# Patient Record
Sex: Female | Born: 1960
Health system: Southern US, Community
[De-identification: ages and names within clinical notes are randomized; demographics above are authoritative.]

## PROBLEM LIST (undated history)

## (undated) DIAGNOSIS — Z8619 Personal history of other infectious and parasitic diseases: Secondary | ICD-10-CM

## (undated) DIAGNOSIS — N83201 Unspecified ovarian cyst, right side: Secondary | ICD-10-CM

## (undated) DIAGNOSIS — R079 Chest pain, unspecified: Secondary | ICD-10-CM

## (undated) DIAGNOSIS — E781 Pure hyperglyceridemia: Secondary | ICD-10-CM

## (undated) DIAGNOSIS — E559 Vitamin D deficiency, unspecified: Secondary | ICD-10-CM

## (undated) DIAGNOSIS — K760 Fatty (change of) liver, not elsewhere classified: Secondary | ICD-10-CM

## (undated) DIAGNOSIS — M549 Dorsalgia, unspecified: Secondary | ICD-10-CM

## (undated) DIAGNOSIS — G932 Benign intracranial hypertension: Secondary | ICD-10-CM

## (undated) DIAGNOSIS — M2550 Pain in unspecified joint: Secondary | ICD-10-CM

## (undated) DIAGNOSIS — R0602 Shortness of breath: Secondary | ICD-10-CM

## (undated) DIAGNOSIS — M1990 Unspecified osteoarthritis, unspecified site: Secondary | ICD-10-CM

## (undated) DIAGNOSIS — E669 Obesity, unspecified: Secondary | ICD-10-CM

## (undated) DIAGNOSIS — F419 Anxiety disorder, unspecified: Secondary | ICD-10-CM

## (undated) DIAGNOSIS — Z6841 Body Mass Index (BMI) 40.0 and over, adult: Secondary | ICD-10-CM

## (undated) DIAGNOSIS — R519 Headache, unspecified: Secondary | ICD-10-CM

## (undated) DIAGNOSIS — K5900 Constipation, unspecified: Secondary | ICD-10-CM

## (undated) DIAGNOSIS — N83202 Unspecified ovarian cyst, left side: Secondary | ICD-10-CM

## (undated) DIAGNOSIS — R002 Palpitations: Secondary | ICD-10-CM

## (undated) DIAGNOSIS — Z8249 Family history of ischemic heart disease and other diseases of the circulatory system: Secondary | ICD-10-CM

## (undated) DIAGNOSIS — G8929 Other chronic pain: Secondary | ICD-10-CM

## (undated) DIAGNOSIS — R7303 Prediabetes: Secondary | ICD-10-CM

## (undated) DIAGNOSIS — Z9889 Other specified postprocedural states: Secondary | ICD-10-CM

## (undated) DIAGNOSIS — R600 Localized edema: Secondary | ICD-10-CM

## (undated) DIAGNOSIS — N979 Female infertility, unspecified: Secondary | ICD-10-CM

## (undated) DIAGNOSIS — J341 Cyst and mucocele of nose and nasal sinus: Secondary | ICD-10-CM

## (undated) HISTORY — DX: Unspecified osteoarthritis, unspecified site: M19.90

## (undated) HISTORY — DX: Prediabetes: R73.03

## (undated) HISTORY — DX: Vitamin D deficiency, unspecified: E55.9

## (undated) HISTORY — DX: Pain in unspecified joint: M25.50

## (undated) HISTORY — DX: Dorsalgia, unspecified: M54.9

## (undated) HISTORY — DX: Personal history of other infectious and parasitic diseases: Z86.19

## (undated) HISTORY — DX: Palpitations: R00.2

## (undated) HISTORY — PX: OVARIAN CYST SURGERY: SHX726

## (undated) HISTORY — DX: Cyst and mucocele of nose and nasal sinus: J34.1

## (undated) HISTORY — DX: Anxiety disorder, unspecified: F41.9

## (undated) HISTORY — DX: Unspecified ovarian cyst, right side: N83.201

## (undated) HISTORY — DX: Unspecified ovarian cyst, left side: N83.202

## (undated) HISTORY — DX: Other chronic pain: G89.29

## (undated) HISTORY — DX: Shortness of breath: R06.02

## (undated) HISTORY — DX: Pure hyperglyceridemia: E78.1

## (undated) HISTORY — DX: Female infertility, unspecified: N97.9

## (undated) HISTORY — DX: Family history of ischemic heart disease and other diseases of the circulatory system: Z82.49

## (undated) HISTORY — DX: Chest pain, unspecified: R07.9

## (undated) HISTORY — DX: Constipation, unspecified: K59.00

## (undated) HISTORY — DX: Headache, unspecified: R51.9

## (undated) HISTORY — PX: NASAL SINUS SURGERY: SHX719

## (undated) HISTORY — DX: Fatty (change of) liver, not elsewhere classified: K76.0

## (undated) HISTORY — PX: MASTOIDECTOMY: SHX711

## (undated) HISTORY — DX: Localized edema: R60.0

## (undated) HISTORY — DX: Body Mass Index (BMI) 40.0 and over, adult: Z684

## (undated) HISTORY — DX: Other specified postprocedural states: Z98.890

## (undated) HISTORY — DX: Morbid (severe) obesity due to excess calories: E66.01

## (undated) HISTORY — PX: TONSILLECTOMY: SUR1361

## (undated) HISTORY — DX: Obesity, unspecified: E66.9

---
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
## 1998-09-30 ENCOUNTER — Other Ambulatory Visit: Admission: RE | Admit: 1998-09-30 | Discharge: 1998-09-30 | Payer: Self-pay | Admitting: Obstetrics and Gynecology

## 1999-04-21 ENCOUNTER — Ambulatory Visit (HOSPITAL_COMMUNITY): Admission: RE | Admit: 1999-04-21 | Discharge: 1999-04-21 | Payer: Self-pay | Admitting: Otolaryngology

## 1999-04-21 ENCOUNTER — Encounter: Payer: Self-pay | Admitting: Otolaryngology

## 1999-10-17 ENCOUNTER — Other Ambulatory Visit: Admission: RE | Admit: 1999-10-17 | Discharge: 1999-10-17 | Payer: Self-pay | Admitting: Obstetrics and Gynecology

## 2001-03-25 ENCOUNTER — Other Ambulatory Visit: Admission: RE | Admit: 2001-03-25 | Discharge: 2001-03-25 | Payer: Self-pay | Admitting: Obstetrics and Gynecology

## 2002-03-26 ENCOUNTER — Other Ambulatory Visit: Admission: RE | Admit: 2002-03-26 | Discharge: 2002-03-26 | Payer: Self-pay | Admitting: Obstetrics and Gynecology

## 2002-12-16 ENCOUNTER — Ambulatory Visit (HOSPITAL_COMMUNITY): Admission: RE | Admit: 2002-12-16 | Discharge: 2002-12-16 | Payer: Self-pay | Admitting: Otolaryngology

## 2002-12-16 ENCOUNTER — Encounter (INDEPENDENT_AMBULATORY_CARE_PROVIDER_SITE_OTHER): Payer: Self-pay

## 2003-11-25 ENCOUNTER — Ambulatory Visit (HOSPITAL_COMMUNITY): Admission: RE | Admit: 2003-11-25 | Discharge: 2003-11-25 | Payer: Self-pay | Admitting: Obstetrics and Gynecology

## 2003-11-25 ENCOUNTER — Encounter (INDEPENDENT_AMBULATORY_CARE_PROVIDER_SITE_OTHER): Payer: Self-pay | Admitting: Specialist

## 2004-12-06 ENCOUNTER — Emergency Department (HOSPITAL_COMMUNITY): Admission: EM | Admit: 2004-12-06 | Discharge: 2004-12-06 | Payer: Self-pay | Admitting: Emergency Medicine

## 2005-10-17 ENCOUNTER — Other Ambulatory Visit: Admission: RE | Admit: 2005-10-17 | Discharge: 2005-10-17 | Payer: Self-pay | Admitting: Obstetrics and Gynecology

## 2007-05-19 ENCOUNTER — Ambulatory Visit: Payer: Self-pay | Admitting: Internal Medicine

## 2007-05-19 ENCOUNTER — Ambulatory Visit: Payer: Self-pay | Admitting: *Deleted

## 2007-06-02 ENCOUNTER — Ambulatory Visit: Payer: Self-pay | Admitting: Internal Medicine

## 2007-12-17 ENCOUNTER — Ambulatory Visit: Payer: Self-pay | Admitting: Family Medicine

## 2008-01-16 ENCOUNTER — Encounter (INDEPENDENT_AMBULATORY_CARE_PROVIDER_SITE_OTHER): Payer: Self-pay | Admitting: Nurse Practitioner

## 2008-01-16 ENCOUNTER — Ambulatory Visit: Payer: Self-pay | Admitting: Family Medicine

## 2008-01-16 LAB — CONVERTED CEMR LAB
ALT: 20 units/L (ref 0–35)
AST: 15 units/L (ref 0–37)
Albumin: 4.4 g/dL (ref 3.5–5.2)
Alkaline Phosphatase: 96 units/L (ref 39–117)
BUN: 13 mg/dL (ref 6–23)
Basophils Absolute: 0 10*3/uL (ref 0.0–0.1)
Basophils Relative: 1 % (ref 0–1)
CO2: 22 meq/L (ref 19–32)
Calcium: 9 mg/dL (ref 8.4–10.5)
Chloride: 104 meq/L (ref 96–112)
Creatinine, Ser: 0.74 mg/dL (ref 0.40–1.20)
Eosinophils Absolute: 0.2 10*3/uL (ref 0.0–0.7)
Eosinophils Relative: 2 % (ref 0–5)
Glucose, Bld: 95 mg/dL (ref 70–99)
HCT: 40.9 % (ref 36.0–46.0)
Hemoglobin: 13.3 g/dL (ref 12.0–15.0)
Lymphocytes Relative: 34 % (ref 12–46)
Lymphs Abs: 3 10*3/uL (ref 0.7–4.0)
MCHC: 32.5 g/dL (ref 30.0–36.0)
MCV: 87.6 fL (ref 78.0–100.0)
Monocytes Absolute: 0.4 10*3/uL (ref 0.1–1.0)
Monocytes Relative: 5 % (ref 3–12)
Neutro Abs: 5.2 10*3/uL (ref 1.7–7.7)
Neutrophils Relative %: 58 % (ref 43–77)
Platelets: 359 10*3/uL (ref 150–400)
Potassium: 4.2 meq/L (ref 3.5–5.3)
RBC: 4.67 M/uL (ref 3.87–5.11)
RDW: 13.2 % (ref 11.5–15.5)
Sodium: 140 meq/L (ref 135–145)
TSH: 1.56 microintl units/mL (ref 0.350–5.50)
Total Bilirubin: 0.6 mg/dL (ref 0.3–1.2)
Total Protein: 6.8 g/dL (ref 6.0–8.3)
WBC: 8.9 10*3/uL (ref 4.0–10.5)

## 2008-01-23 ENCOUNTER — Ambulatory Visit (HOSPITAL_COMMUNITY): Admission: RE | Admit: 2008-01-23 | Discharge: 2008-01-23 | Payer: Self-pay | Admitting: Family Medicine

## 2008-02-27 ENCOUNTER — Ambulatory Visit: Payer: Self-pay | Admitting: Family Medicine

## 2008-05-31 ENCOUNTER — Ambulatory Visit: Payer: Self-pay | Admitting: Internal Medicine

## 2008-06-03 ENCOUNTER — Ambulatory Visit (HOSPITAL_COMMUNITY): Admission: RE | Admit: 2008-06-03 | Discharge: 2008-06-03 | Payer: Self-pay | Admitting: Internal Medicine

## 2010-05-10 ENCOUNTER — Ambulatory Visit: Payer: Self-pay | Admitting: Internal Medicine

## 2010-05-18 ENCOUNTER — Ambulatory Visit (HOSPITAL_COMMUNITY): Admission: RE | Admit: 2010-05-18 | Discharge: 2010-05-18 | Payer: Self-pay | Admitting: Internal Medicine

## 2010-06-05 ENCOUNTER — Ambulatory Visit: Payer: Self-pay | Admitting: Internal Medicine

## 2010-06-05 ENCOUNTER — Encounter (INDEPENDENT_AMBULATORY_CARE_PROVIDER_SITE_OTHER): Payer: Self-pay | Admitting: Adult Health

## 2010-06-05 LAB — CONVERTED CEMR LAB
ALT: 17 units/L (ref 0–35)
AST: 11 units/L (ref 0–37)
Albumin: 4.4 g/dL (ref 3.5–5.2)
Alkaline Phosphatase: 92 units/L (ref 39–117)
BUN: 13 mg/dL (ref 6–23)
Basophils Absolute: 0 10*3/uL (ref 0.0–0.1)
Basophils Relative: 1 % (ref 0–1)
CO2: 24 meq/L (ref 19–32)
Calcium: 9.1 mg/dL (ref 8.4–10.5)
Chlamydia, DNA Probe: NEGATIVE
Chloride: 105 meq/L (ref 96–112)
Cholesterol: 173 mg/dL (ref 0–200)
Creatinine, Ser: 0.83 mg/dL (ref 0.40–1.20)
Eosinophils Absolute: 0.2 10*3/uL (ref 0.0–0.7)
Eosinophils Relative: 4 % (ref 0–5)
GC Probe Amp, Genital: NEGATIVE
Glucose, Bld: 108 mg/dL — ABNORMAL HIGH (ref 70–99)
HCT: 43.7 % (ref 36.0–46.0)
HDL: 58 mg/dL (ref >39)
Hemoglobin: 13.8 g/dL (ref 12.0–15.0)
LDL Cholesterol: 85 mg/dL (ref 0–99)
Lymphocytes Relative: 37 % (ref 12–46)
Lymphs Abs: 2.4 10*3/uL (ref 0.7–4.0)
MCHC: 31.6 g/dL (ref 30.0–36.0)
MCV: 90.7 fL (ref 78.0–100.0)
Monocytes Absolute: 0.3 10*3/uL (ref 0.1–1.0)
Monocytes Relative: 5 % (ref 3–12)
Neutro Abs: 3.6 10*3/uL (ref 1.7–7.7)
Neutrophils Relative %: 54 % (ref 43–77)
Pap Smear: NEGATIVE
Platelets: 359 10*3/uL (ref 150–400)
Potassium: 4.5 meq/L (ref 3.5–5.3)
RBC: 4.82 M/uL (ref 3.87–5.11)
RDW: 13.5 % (ref 11.5–15.5)
Sodium: 137 meq/L (ref 135–145)
TSH: 1.963 microintl units/mL (ref 0.350–4.500)
Total Bilirubin: 0.4 mg/dL (ref 0.3–1.2)
Total CHOL/HDL Ratio: 3
Total Protein: 6.8 g/dL (ref 6.0–8.3)
Triglycerides: 150 mg/dL — ABNORMAL HIGH (ref <150)
VLDL: 30 mg/dL (ref 0–40)
Vit D, 25-Hydroxy: 35 ng/mL (ref 30–89)
WBC: 6.6 10*3/uL (ref 4.0–10.5)

## 2010-06-27 ENCOUNTER — Ambulatory Visit: Payer: Self-pay | Admitting: Internal Medicine

## 2011-03-30 NOTE — Op Note (Signed)
NAME:  Morgan Maddox, Morgan Maddox                          ACCOUNT NO.:  [id number]   MEDICAL RECORD NO.:  [id number]                   PATIENT TYPE:  OIB   LOCATION:  2870                                 FACILITY:  [facility]   PHYSICIAN:  Hermelinda Medicus, M.D.                DATE OF BIRTH:  02-Apr-1961   DATE OF PROCEDURE:  12/16/2002  DATE OF DISCHARGE:                                 OPERATIVE REPORT   PREOPERATIVE DIAGNOSIS:  Bilateral maxillary sinusitis with history of  maxillary sinus surgery, with right maxillary sinus mucocele, with right  ethmoid sinusitis.   POSTOPERATIVE DIAGNOSIS:  Bilateral maxillary sinusitis with history of  maxillary sinus surgery, with right maxillary sinus mucocele, with right  ethmoid sinusitis.   PROCEDURE:  Functional endoscopic sinus surgery with bilateral maxillary  sinus ostial enlargement, with removal of occlusive very large mucocele and  polyps, and a right ethmoidectomy.   SURGEON:  Hermelinda Medicus, M.D.   ANESTHESIA:  Local 1% Xylocaine with epinephrine, topical cocaine 200 mg,  with MAC standby with Sheldon Silvan, M.D.   DESCRIPTION OF PROCEDURE:  The patient was placed in the supine position  under local anesthesia, was prepped and draped in a sterile manner using  Betadine x3 and the usual head drape.  The nose was anesthetized with the  above-mentioned local anesthetic and then the right side, which was by far  the worst side, was approached first.  We found polyps of the ethmoid and  maxillary sinus ostia and then we immediately entered the ethmoid sinus and  primarily was an anterior problem, and we were able to remove the thickened  membrane and polyps from the ethmoid sinus on the right side using the 0  degree scope and the straight Blakesley-Wildes.  The maxillary sinus was  totally obstructed also with polypoid debris and then this very large  mucocele, which we were able to tease out essentially intact, which was a  very large mucous cyst  containing clear fluid we did not culture.  She had  been on Levaquin.  Once this was removed, we did an antrostomy and further  suctioned her sinus and then looked into her sinus, and the mucous membrane  itself, which was left behind, was in reasonably decent condition though it  was somewhat thickened, will revert to its more normal status.  Once this  was achieved, we then worked toward the left side, and we were able to see  into the left maxillary sinus.  A few polyps were seen.  We opened the  natural ostium a little bit more, though we had a small ostium there, and  removed some of the thickened membrane so it would drain more effectively.  Once this was achieved, we then used the 0 degree scope, the 70 degree  scope, the straight and  upbiting Blakesley-Wildes.  Once this was achieved, we placed Gelfoam within  the ethmoid  sinus on the right side and put Telfa on the right side.  The  patient tolerated the procedure very well.  Her follow-up will be today at  3:30 for packing removal and then one week, three weeks, six weeks, six  months, and a year.                                               Hermelinda Medicus, M.D.    JC/MEDQ  D:  12/16/2002  T:  12/16/2002  Job:  [id number]   cc:   Wilson Singer, M.D.  [address]  [place]  [place] [address]  Fax: [phone]

## 2011-03-30 NOTE — H&P (Signed)
NAME:  Morgan Maddox, Morgan Maddox                          ACCOUNT NO.:  [id number]   MEDICAL RECORD NO.:  [id number]                   PATIENT TYPE:  OIB   LOCATION:  2870                                 FACILITY:  [facility]   PHYSICIAN:  Hermelinda Medicus, M.D.                DATE OF BIRTH:  1961/03/05   DATE OF ADMISSION:  12/16/2002  DATE OF DISCHARGE:                                HISTORY & PHYSICAL   HISTORY OF PRESENT ILLNESS:  This patient is a 50 year old female who has  had a long history of sinusitis, going back to the early 1980s with me.  She  has had a left Caldwell-Luc and nasal antrostomy in August 1984.  She has  had some endoscopic sinus surgery, right maxillary sinus osteoplasty in  1989, and she has done reasonably well, until just recently, when we needed  to give her some prednisone, a prednisone Dosepak, in 2000.  She did better,  then in 2004, she has progressed with her medical physician but had come in  to see me, having to use Lorabid in the past and not resolving this sinus  infection.  She then was placed on Levaquin 500 mg daily and Prolex DH and  still with poor resolution, so we obtained a CAT scan which showed primarily  a very large -- totally obliterating the sinus -- right maxillary sinus  mucocele with some evidence of left maxillary sinusitis and right ethmoid  sinusitis.  She continues her decongestants, has had two rounds of the  antibiotic using Levaquin and now enters for functional endoscopic sinus  surgery to remove the mucocele, to revise the maxillary ostia and to open  the ethmoid sinus on the right side once again.   PAST MEDICAL HISTORY:  Her past history is unremarkable.   MEDICATIONS:  She has no medications except the above-mentioned.   SOCIAL HISTORY:  She is a smoker of about one pack a day and does an  occasional alcohol.   PAST SURGICAL HISTORY:  She has had sinus surgery as we above mentioned,  ovarian cystectomy four times and a  tonsillectomy.   REVIEW OF SYSTEMS:  She does have some joint pain in her knees.  Remainder  of her review of systems is unremarkable.   ALLERGIES:  She has no allergies to medications.   PHYSICAL EXAMINATION:  VITAL SIGNS:  Her physical examination reveals a  blood pressure of 123/85, she is 5 feet 7 inches, she weighs 248, heart rate  is 77.  HEENT:  The ears are clear.  The oral cavity is clear.  The nose shows  turbinate hypertrophy of moderate proportion but we can see some polypoid  debris in the right nasal vestibule near the middle turbinate.  I feel some  of this is probably from the ethmoid, some from the maxillary on the right  side.  The left side is in better condition  but the CAT scan shows the left  side also shows some thickening of the mucous membrane and fluid along the  floor of the sinus.  Her larynx is clear.  True cords, false cords,  epiglottis, base of tongue are clear.  NECK:  Neck is free of any thyromegaly, cervical adenopathy or mass.  CHEST:  Chest clear.  No rales, rhonchi or wheezes.  She does have a bit of  a cough secondary to her smoking.  CARDIOVASCULAR:  Unremarkable.  EKG is normal sinus rhythm.  ABDOMEN:  Abdomen is obese.  EXTREMITIES:  Extremities are within normal limits.   INITIAL DIAGNOSES:  1. Bilateral maxillary sinusitis with right maxillary sinus totally     occlusive mucocele, right ethmoid sinusitis.  2. History of ovarian cyst.  3. History of tonsillectomy.  4. History of left Caldwell-Luc and a right maxillary ostial enlargement in     1984 and 1989.                                               Hermelinda Medicus, M.D.    JC/MEDQ  D:  12/16/2002  T:  12/16/2002  Job:  [id number]   cc:   Wilson Singer, M.D.  [address]  [place]  [place] [address]  Fax: [phone]

## 2011-03-30 NOTE — Op Note (Signed)
NAME:  Morgan Maddox, Morgan Maddox                          ACCOUNT NO.:  [id number]   MEDICAL RECORD NO.:  [id number]                   PATIENT TYPE:  AMB   LOCATION:  SDC                                  FACILITY:  [facility]   PHYSICIAN:  Miguel Aschoff, M.D.                    DATE OF BIRTH:  1960/11/21   DATE OF PROCEDURE:  11/25/2003  DATE OF DISCHARGE:                                 OPERATIVE REPORT   PREOPERATIVE DIAGNOSIS:  Postmenopausal bleeding.   POSTOPERATIVE DIAGNOSIS:  Postmenopausal bleeding.   PROCEDURE:  D&C hysteroscopy.   SURGEON:  Miguel Aschoff, M.D.   ANESTHESIA:  General.   COMPLICATIONS:  None.   JUSTIFICATION:  The patient is a 50 year old white female, confirmed to be  postmenopausal approximately 5 years ago with gonadotropin studies.  The  patient had no bleeding for 5 years and presented to the  office with heavy,  profuse vaginal bleeding on November 24, 2003.  She presents now to undergo  D&C and hysteroscopy to see if an etiology for the bleeding could be  established and corrected.  The risks and benefits of the procedure were  discussed with the patient.   DESCRIPTION OF PROCEDURE:  The patient was taken to the operating room,  placed in the supine position, and general anesthesia was administered  without difficulty.  She was then placed in the dorsal lithotomy position,  prepped and draped in the usual sterile fashion.  Bladder was catheterized.  Examination was carried out.  This revealed normal external genitalia,  normal Bartholin Skene's glands, normal urethra - the vulva was without  gross lesion.  The cervix was without gross lesions. The uterus was noted to  be globular, top-normal in size.  No adnexal masses were noted.  Speculum  was then placed in the vaginal vault.  Anterior cervical lip was grasped  with the tenaculum and then dilated using serial Pratt dilators.  At this  point, the diagnostic hysteroscope was advanced through the endocervix.  No  endocervical lesions were noted.  Inspection of the endometrial cavity did  not reveal any submucous myomas or polyps.  There did appear to be a large  amount of endometrial tissue present.  This was diffuse in nature and not  localized.  The hysteroscope was then removed, and a cervical curettage was  then carried out and sent as a  Separate specimen, and then vigorous endometrial curettage was carried out  with a serrated curette and the tissue removed sent for histologic study.  At this point, with no other abnormalities being noted, the instruments were  removed.  Hemostasis was readily achieved, and the patient was taken out of  lithotomy position and brought to the recovery room in satisfactory  condition.  The estimated blood loss was approximately 40 mL.  The patient  tolerated the procedure well and then went to the recovery room in  satisfactory condition.                                               Miguel Aschoff, M.D.   AR/MEDQ  D:  11/25/2003  T:  11/25/2003  Job:  [id number]

## 2013-04-14 ENCOUNTER — Emergency Department
Admission: EM | Admit: 2013-04-14 | Discharge: 2013-04-14 | Disposition: A | Discharge status: Home / Self Care | Payer: Self-pay | Source: Home / Self Care | Attending: Family Medicine | Admitting: Family Medicine

## 2013-04-14 ENCOUNTER — Emergency Department (INDEPENDENT_AMBULATORY_CARE_PROVIDER_SITE_OTHER): Payer: BC Managed Care – PPO

## 2013-04-14 ENCOUNTER — Encounter: Payer: Self-pay | Admitting: *Deleted

## 2013-04-14 DIAGNOSIS — R059 Cough, unspecified: Secondary | ICD-10-CM

## 2013-04-14 DIAGNOSIS — R0600 Dyspnea, unspecified: Secondary | ICD-10-CM

## 2013-04-14 DIAGNOSIS — R635 Abnormal weight gain: Secondary | ICD-10-CM

## 2013-04-14 DIAGNOSIS — J9819 Other pulmonary collapse: Secondary | ICD-10-CM

## 2013-04-14 DIAGNOSIS — R05 Cough: Secondary | ICD-10-CM

## 2013-04-14 DIAGNOSIS — R0609 Other forms of dyspnea: Secondary | ICD-10-CM

## 2013-04-14 DIAGNOSIS — R0989 Other specified symptoms and signs involving the circulatory and respiratory systems: Secondary | ICD-10-CM

## 2013-04-14 DIAGNOSIS — R5381 Other malaise: Secondary | ICD-10-CM

## 2013-04-14 DIAGNOSIS — R079 Chest pain, unspecified: Secondary | ICD-10-CM

## 2013-04-14 LAB — POCT CBC W AUTO DIFF (K'VILLE URGENT CARE)

## 2013-04-14 LAB — POCT URINALYSIS DIPSTICK
Blood, UA: NEGATIVE
Glucose, UA: NEGATIVE
Protein, UA: NEGATIVE
Spec Grav, UA: >=1.03 (ref 1.005–1.03)
Urobilinogen, UA: 0.2 (ref 0–1)

## 2013-04-14 NOTE — ED Notes (Signed)
Pt c/o cough, lower extremity swelling, fatigue, and chest congestion x 4wks. Denies fever. She took levaquin 500mg , allegra and saline NS with no relief.

## 2013-04-14 NOTE — ED Provider Notes (Addendum)
History     CSN: [id number]  Arrival date & time 04/14/13  1509   First MD Initiated Contact with Patient 04/14/13 1556      Chief Complaint  Patient presents with  . Fatigue  . Cough  . Leg Swelling       HPI Comments: Patient reports that she developed a URI about 4 weeks ago, including a very sore throat.  She developed sinus congestion and cough, and felt very fatigued.  On 04/01/13 she visited a [facility] where she was treated for Sinusitis and a UTI with Levaquin.  The patient stopped the Levaquin after 6 days because she felt worse on the medication.  Since then she has remained unusually fatigued, with persistent chest congestion especially at night.  She has an occasional non-productive cough.  She has developed shortness of breath with activity and wheezes at times.  Her right ear feels clogged.  She has occasional chills but no fever.  She notes mild urinary frequency but no dysuria at present. Her appetite is decreased, but she has gained approximately 32 pounds in the past 3 months.  She notes an increase in lower leg swellng each evening, resolved by morning. Family history of CV disease:  Her mother had diabetes, and had several MI's beginning age 67. Social history:  Quit smoking 9 months ago (1/2 to 1 pack per day)   The history is provided by the patient.    History reviewed. No pertinent past medical history.  Past Surgical History  Procedure Laterality Date  . Nasal sinus surgery    . Tonsillectomy    . Ovarian cyst surgery      Family History  Problem Relation Age of Onset  . Heart failure Mother     History  Substance Use Topics  . Smoking status: Former Games developer  . Smokeless tobacco: Not on file  . Alcohol Use: Yes    OB History   Grav Para Term Preterm Abortions TAB SAB Ect Mult Living                  Review of Systems  Constitutional: Positive for chills, activity change, appetite change, fatigue and unexpected weight change. Negative for  fever and diaphoresis.  HENT: Positive for ear pain, congestion, sore throat, rhinorrhea, sneezing and neck stiffness. Negative for nosebleeds, facial swelling, trouble swallowing, neck pain, sinus pressure and ear discharge.   Eyes: Negative.   Respiratory: Positive for cough, shortness of breath and wheezing. Negative for chest tightness.   Cardiovascular: Negative.   Gastrointestinal: Negative.   Genitourinary: Positive for frequency. Negative for dysuria, urgency, hematuria, flank pain, decreased urine volume, difficulty urinating and pelvic pain.  Skin: Negative.   Neurological: Positive for headaches.    Allergies  Benadryl; Caladryl; and Calamine  Home Medications   Current Outpatient Rx  Name  Route  Sig  Dispense  Refill  . aspirin 81 MG tablet   Oral   Take 81 mg by mouth daily.           BP 137/83  Pulse 87  Temp(Src) 98.2 F (36.8 C) (Oral)  Resp 18  Wt 262 lb (118.842 kg)  SpO2 98%  Physical Exam Nursing notes and Vital Signs reviewed. Appearance:  Patient appears stated age, and in no acute distress.  Patient is obese Eyes:  Pupils are equal, round, and reactive to light and accomodation.  Extraocular movement is intact.  Conjunctivae are not inflamed  Ears:  Canals normal.  Tympanic membranes  normal.  Nose:  Mildly congested turbinates.  No sinus tenderness.   Mouth:  No lesions Pharynx:  Normal Neck:  Supple.   Tender shotty posterior nodes are palpated bilaterally  Lungs:  Clear to auscultation.  Breath sounds are equal.  Heart:  Regular rate and rhythm without murmurs, rubs, or gallops.  Abdomen:   Mild tenderness over spleen without masses or hepatosplenomegaly.  Bowel sounds are present.  No CVA or flank tenderness.  Extremities:  Trace lower leg edema.  No calf tenderness Skin:  No rash present.   ED Course  Procedures  none  Labs Reviewed  COMPLETE METABOLIC PANEL WITH GFR pending  TSH pending  EBV AB TO VIRAL CAPSID AG PNL, IGG+IGM pending   POCT URINALYSIS DIPSTICK:  KET Trace; SG >= 1.030, otherwise negative  POCT CBC W AUTO DIFF ([facility]):  WBC 7.7; LY 40.5; MO 16.2; GR 43.3; Hgb 12.8; Platelets 312  EKG:  Occasional PVC; otherwise no acute changes   Dg Chest 2 View  04/14/2013   *RADIOLOGY REPORT*  Clinical Data: Cough leg swelling, chest pain  CHEST - 2 VIEW  Comparison: None.  Findings: Cardiomediastinal silhouette is unremarkable.  Elevation of the left hemidiaphragm is noted.  No acute infiltrate or pulmonary edema.  Mild basilar atelectasis.  IMPRESSION: Elevation of the left hemidiaphragm.  No active disease.  Mild basilar atelectasis.   Original Report Authenticated By: Natasha Mead, M.D.     1. Other malaise and fatigue; note normal white blood count with monocytosis (Monocytes 16.2%);  ?Convalescent mono   2. Dyspnea   3. Weight gain, abnormal       MDM  Check EBV titers, CMP, and TSH Followup with PCP one week.  Patient has multiple risk factors for CV disease and DM        Lattie Haw, MD 04/15/13 1154  Addendum: EBV VCA IgM and IgG positive.  Diagnosis: Convalescent Mononucleosis CMP and TSH normal. Discussed results with patient.  She has a followup appt with Dr. Ivan Anchors on 04/21/13 at 9AM.  Lattie Haw, MD 04/15/13 1218

## 2013-04-15 LAB — COMPLETE METABOLIC PANEL WITH GFR
Albumin: 3.9 g/dL (ref 3.5–5.2)
Alkaline Phosphatase: 74 U/L (ref 39–117)
BUN: 16 mg/dL (ref 6–23)
CO2: 28 mEq/L (ref 19–32)
GFR, Est African American: 70 mL/min
GFR, Est Non African American: 61 mL/min
Glucose, Bld: 93 mg/dL (ref 70–99)
Potassium: 4.4 mEq/L (ref 3.5–5.3)

## 2013-04-15 LAB — TSH: TSH: 2.229 u[IU]/mL (ref 0.350–4.500)

## 2013-04-15 LAB — EBV AB TO VIRAL CAPSID AG PNL, IGG+IGM
EBV VCA IgG: 338 U/mL — ABNORMAL HIGH (ref <18.0)
EBV VCA IgM: >160 U/mL — ABNORMAL HIGH (ref <36.0)

## 2013-04-21 ENCOUNTER — Encounter: Payer: Self-pay | Admitting: Family Medicine

## 2013-04-21 ENCOUNTER — Ambulatory Visit (INDEPENDENT_AMBULATORY_CARE_PROVIDER_SITE_OTHER): Payer: BC Managed Care – PPO | Admitting: Family Medicine

## 2013-04-21 VITALS — BP 125/84 | HR 75 | Ht 67.0 in | Wt 258.0 lb

## 2013-04-21 DIAGNOSIS — E781 Pure hyperglyceridemia: Secondary | ICD-10-CM

## 2013-04-21 DIAGNOSIS — Z9889 Other specified postprocedural states: Secondary | ICD-10-CM

## 2013-04-21 DIAGNOSIS — J341 Cyst and mucocele of nose and nasal sinus: Secondary | ICD-10-CM

## 2013-04-21 DIAGNOSIS — R609 Edema, unspecified: Secondary | ICD-10-CM

## 2013-04-21 DIAGNOSIS — Z8619 Personal history of other infectious and parasitic diseases: Secondary | ICD-10-CM

## 2013-04-21 DIAGNOSIS — R739 Hyperglycemia, unspecified: Secondary | ICD-10-CM

## 2013-04-21 DIAGNOSIS — E782 Mixed hyperlipidemia: Secondary | ICD-10-CM | POA: Insufficient documentation

## 2013-04-21 DIAGNOSIS — R7309 Other abnormal glucose: Secondary | ICD-10-CM

## 2013-04-21 DIAGNOSIS — R600 Localized edema: Secondary | ICD-10-CM

## 2013-04-21 HISTORY — DX: Pure hyperglyceridemia: E78.1

## 2013-04-21 HISTORY — DX: Personal history of other infectious and parasitic diseases: Z86.19

## 2013-04-21 HISTORY — DX: Cyst and mucocele of nose and nasal sinus: J34.1

## 2013-04-21 HISTORY — DX: Other specified postprocedural states: Z98.890

## 2013-04-21 LAB — CBC
HCT: 40.3 % (ref 36.0–46.0)
Hemoglobin: 14.5 g/dL (ref 12.0–15.0)
MCH: 30.1 pg (ref 26.0–34.0)
MCHC: 36 g/dL (ref 30.0–36.0)
RBC: 4.82 MIL/uL (ref 3.87–5.11)

## 2013-04-21 LAB — LIPID PANEL
Cholesterol: 181 mg/dL (ref 0–200)
HDL: 61 mg/dL (ref >39)
LDL Cholesterol: 97 mg/dL (ref 0–99)
Total CHOL/HDL Ratio: 3 Ratio
Triglycerides: 114 mg/dL (ref <150)
VLDL: 23 mg/dL (ref 0–40)

## 2013-04-21 NOTE — Progress Notes (Signed)
CC: Morgan Maddox is a 52 y.o. female is here for Establish Care and Toxic Inhalation   Subjective: HPI:  Patient presents to establish care  Followup mononucleosis, elevated IgM and IgG last week in urgent care. Since that visit she has reported somewhat improved fatigue, significantly improved pharyngitis, slightly improved posterior neck tenderness, and mild improvement of left upper quadrant pain. Overall she states she feels she is improving without any intervention other than relative rest.  She currently denies fevers, chills, unintentional weight loss, abdominal pain, rashes.  She does not engage in competitive sports, contact sports, nor situations where she may take a blow to the left upper quadrant.  She's many questions about how she possibly could have gotten mono, no known sick contacts  She complains of lower extremity edema bilateral and symmetric for the past month. It is worse after long periods of sitting, improved with elevation of legs, best first thing in the morning. She had this 1990 and was taking Lasix as needed. She denies personal history of blood clots, asymmetric swelling, nor weeping of the legs. She denies personal history of CHF, kidney failure, nor protein abnormality.  She reports history of hypertriglyceridemia. Currently trying to watch what she eats admits room for improvement. No formal exercise program.  She reports strong family history of type 2 diabetes and early heart disease, she believes she has had elevated glucose sometime in the past. Denies polyuria polyphasia polydipsia  Review of Systems - General ROS: negative for - chills, fever, night sweats, weight gain or weight loss Ophthalmic ROS: negative for - decreased vision Psychological ROS: negative for - anxiety or depression ENT ROS: negative for - hearing change, nasal congestion, tinnitus or allergies Hematological and Lymphatic ROS: negative for - bleeding problems, bruising or swollen  lymph nodes Breast ROS: negative Respiratory ROS: no cough, shortness of breath, or wheezing Cardiovascular ROS: no chest pain or dyspnea on exertion Gastrointestinal ROS: no abdominal pain, change in bowel habits, or black or bloody stools Genito-Urinary ROS: negative for - genital discharge, genital ulcers, incontinence or abnormal bleeding from genitals Musculoskeletal ROS: negative for - joint pain or muscle pain Neurological ROS: negative for - headaches or memory loss Dermatological ROS: negative for lumps, mole changes, rash and skin lesion changes Past Medical History  Diagnosis Date  . Hypertriglyceridemia 04/21/2013  . 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 History of sinus surgery 04/21/2013  . History of mononucleosis 04/21/2013     Family History  Problem Relation Age of Onset  . Heart failure Mother   .        History  Substance Use Topics  . Smoking status: Former Games developer  . Smokeless tobacco: Not on file  . Alcohol Use: Yes     Objective: Filed Vitals:   04/21/13 0920  BP: 125/84  Pulse: 75    General: Alert and Oriented, No Acute Distress HEENT: Pupils equal, round, reactive to light. Conjunctivae clear.  External ears unremarkable, canals clear with intact TMs with appropriate landmarks.  Middle ear appears open without effusion. Pink inferior turbinates.  Moist mucous membranes, pharynx without inflammation nor lesions.  Mild postauricular lymphadenopathy on the right otherwise no palpable masses in the neck. Lungs: Clear to auscultation bilaterally, no wheezing/ronchi/rales.  Comfortable work of breathing. Good air movement. Cardiac: Regular rate and rhythm. Normal S1/S2.  No murmurs, rubs, nor gallops.   Abdomen: Obese soft nontender with no left upper quadrant pain nor palpable spleen. Extremities: No peripheral edema.  Strong  peripheral pulses.  Mental Status: No depression, anxiety, nor agitation. Skin: Warm and dry.  Assessment & Plan: Morgan Maddox was seen  today for establish care and toxic inhalation.  Diagnoses and associated orders for this visit:  History of mononucleosis - CBC  Hypertriglyceridemia - Lipid panel  Hyperglycemia - Hemoglobin A1c     Mononucleosis: Clinically improving, will obtain white count and differential Hypertriglyceridemia: Due for cholesterol panel she is fasting Hypoglycemia: Check an A1c, random glucose last week normal I encouraged her to have her annual mammogram, she would prefer to wait until feeling better, she would prefer to wait for colonoscopy until a later date due to scheduling issues  30 minutes spent face-to-face during visit today of which at least 50% was counseling or coordinating care regarding breast cancer screening, coloRectal cancer screening, hypertriglyceridemia, hyperglycemia, mononucleosis.   Return in about 4 weeks (around 05/19/2013) for CPE.

## 2013-04-22 ENCOUNTER — Telehealth: Payer: Self-pay | Admitting: Family Medicine

## 2013-04-22 DIAGNOSIS — R7303 Prediabetes: Secondary | ICD-10-CM

## 2013-04-22 NOTE — Telephone Encounter (Signed)
[name], Will you please let Morgan Maddox know that her cholesterol and triglycerides look fantastic, additionally her white blood cell count is within normal range reflecting her immune system resolving from mono.  I'd encourage her to pay particular attention to her three month average blood sugar measurement which was just barely in the pre-diabetic range.   I would not recommend any new medications at this point, but diet and exercise are key.  I'd be happy to arrange a nutritionist referral if she'd like.    F/U at her convenience for CPE

## 2013-04-22 NOTE — Telephone Encounter (Signed)
Left complete message on pt's cell phone

## 2013-05-06 ENCOUNTER — Telehealth: Payer: Self-pay | Admitting: Family Medicine

## 2013-05-06 NOTE — Telephone Encounter (Signed)
Andrea/Or Coverage, Can you please copy/paste or relay that Morgan Maddox was diagnosed with mononucleosis on 04/14/13 and when I met her on 04/21/13 for follow up her white blood cell count was well within normal limits.  I would consider her not infectious from 04/21/13 on.  The only way this would be transferred to others would be if she shared saliva with others prior to me meeting her on 04/21/13.

## 2013-05-06 NOTE — Telephone Encounter (Deleted)
Pt will need a letter from Dr.

## 2013-05-06 NOTE — Telephone Encounter (Signed)
Pt calls and states that HR wants a letter stating that her white count is normal, how long she would be contagious, was ok to be back at work ( has been back at work 2 weeks), states employees have found out she had MONO and now they all want to know if they are going to get it and even if she needs to be back at work so soon.  Pt will pick up letter when ready. Barry Dienes, LPN

## 2013-05-06 NOTE — Telephone Encounter (Signed)
Pt works at [employer] and her job is requiring a letter with the  following information: Diagnosis                                                   Has white blood cel

## 2013-05-07 ENCOUNTER — Encounter: Payer: Self-pay | Admitting: *Deleted

## 2013-05-07 NOTE — Telephone Encounter (Signed)
LMOM for pt that her letter will be ready for pick up on Friday morning after Dr. Ivan Anchors signs it.  Meyer Cory, LPN

## 2014-07-30 IMAGING — CR DG CHEST 2V
2 series · 2 of 2 positions shown · non-contrast
Comparison: None.

CLINICAL DATA: Cough leg swelling, chest pain

CHEST - 2 VIEW

[view not recorded (1 of 2)]
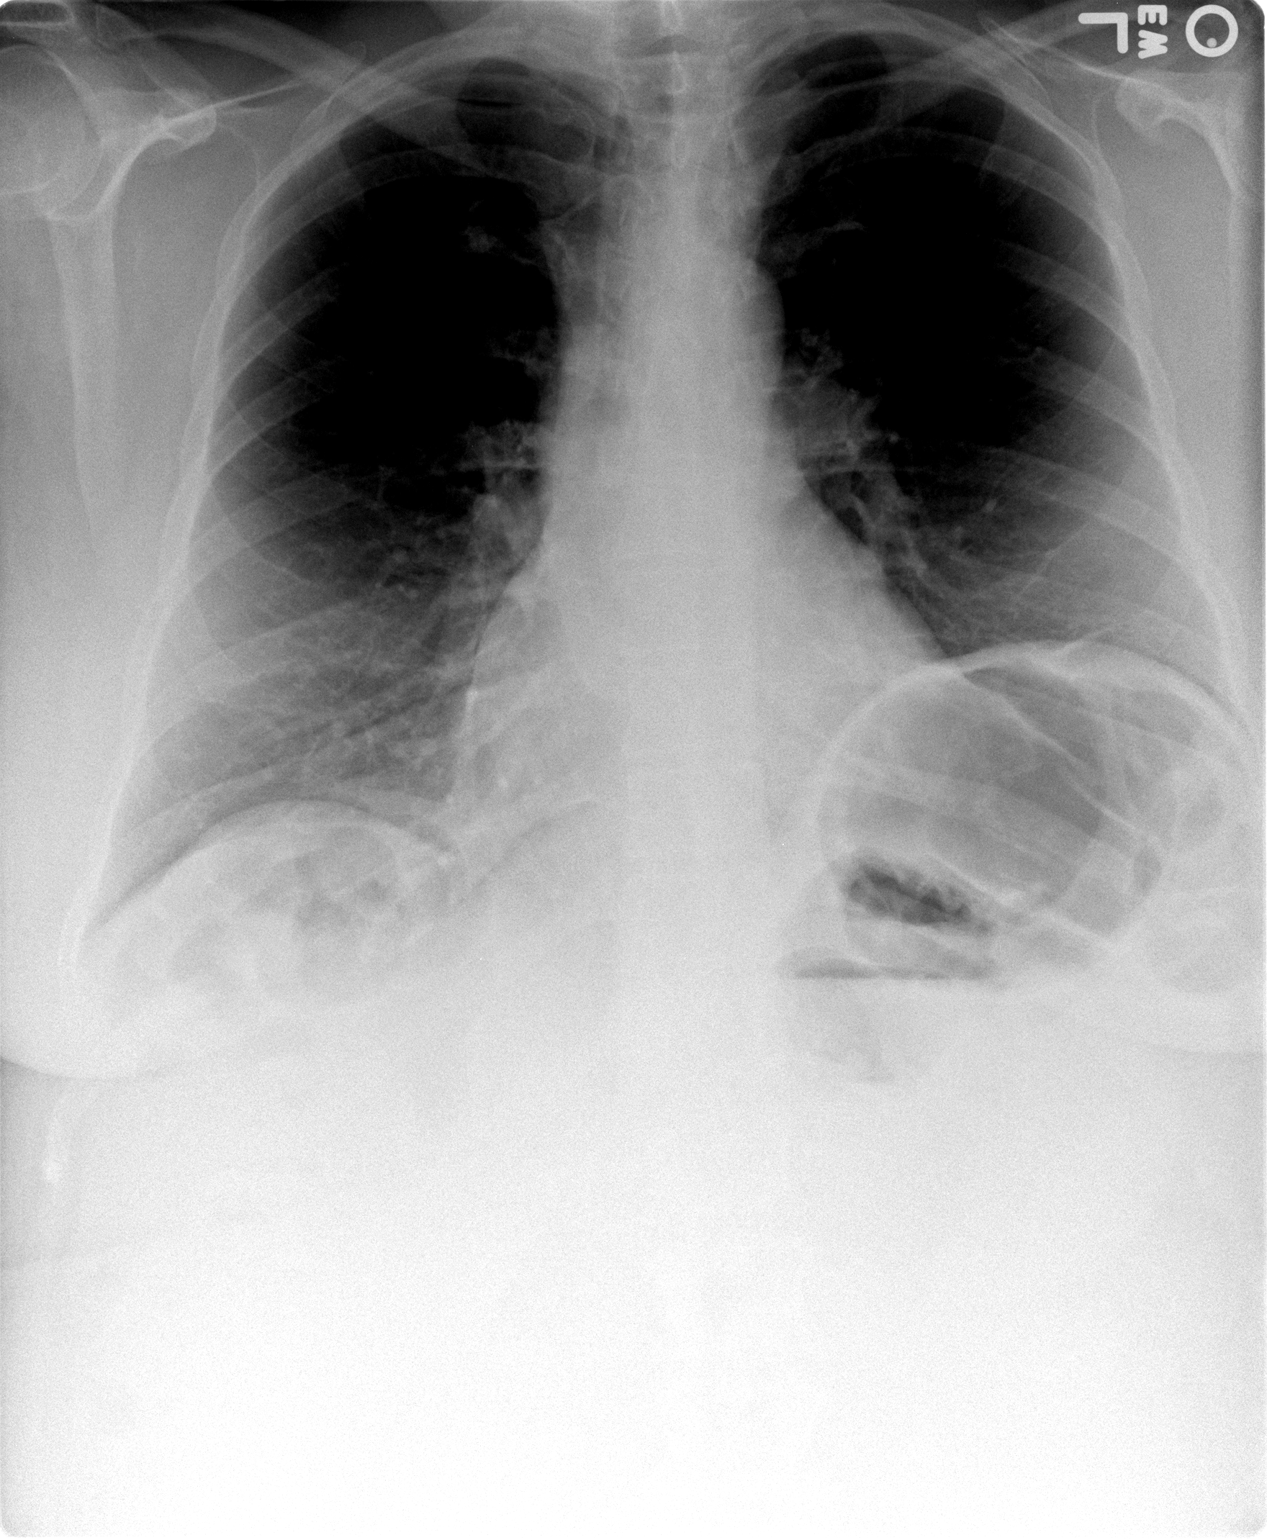

[view not recorded (2 of 2)]
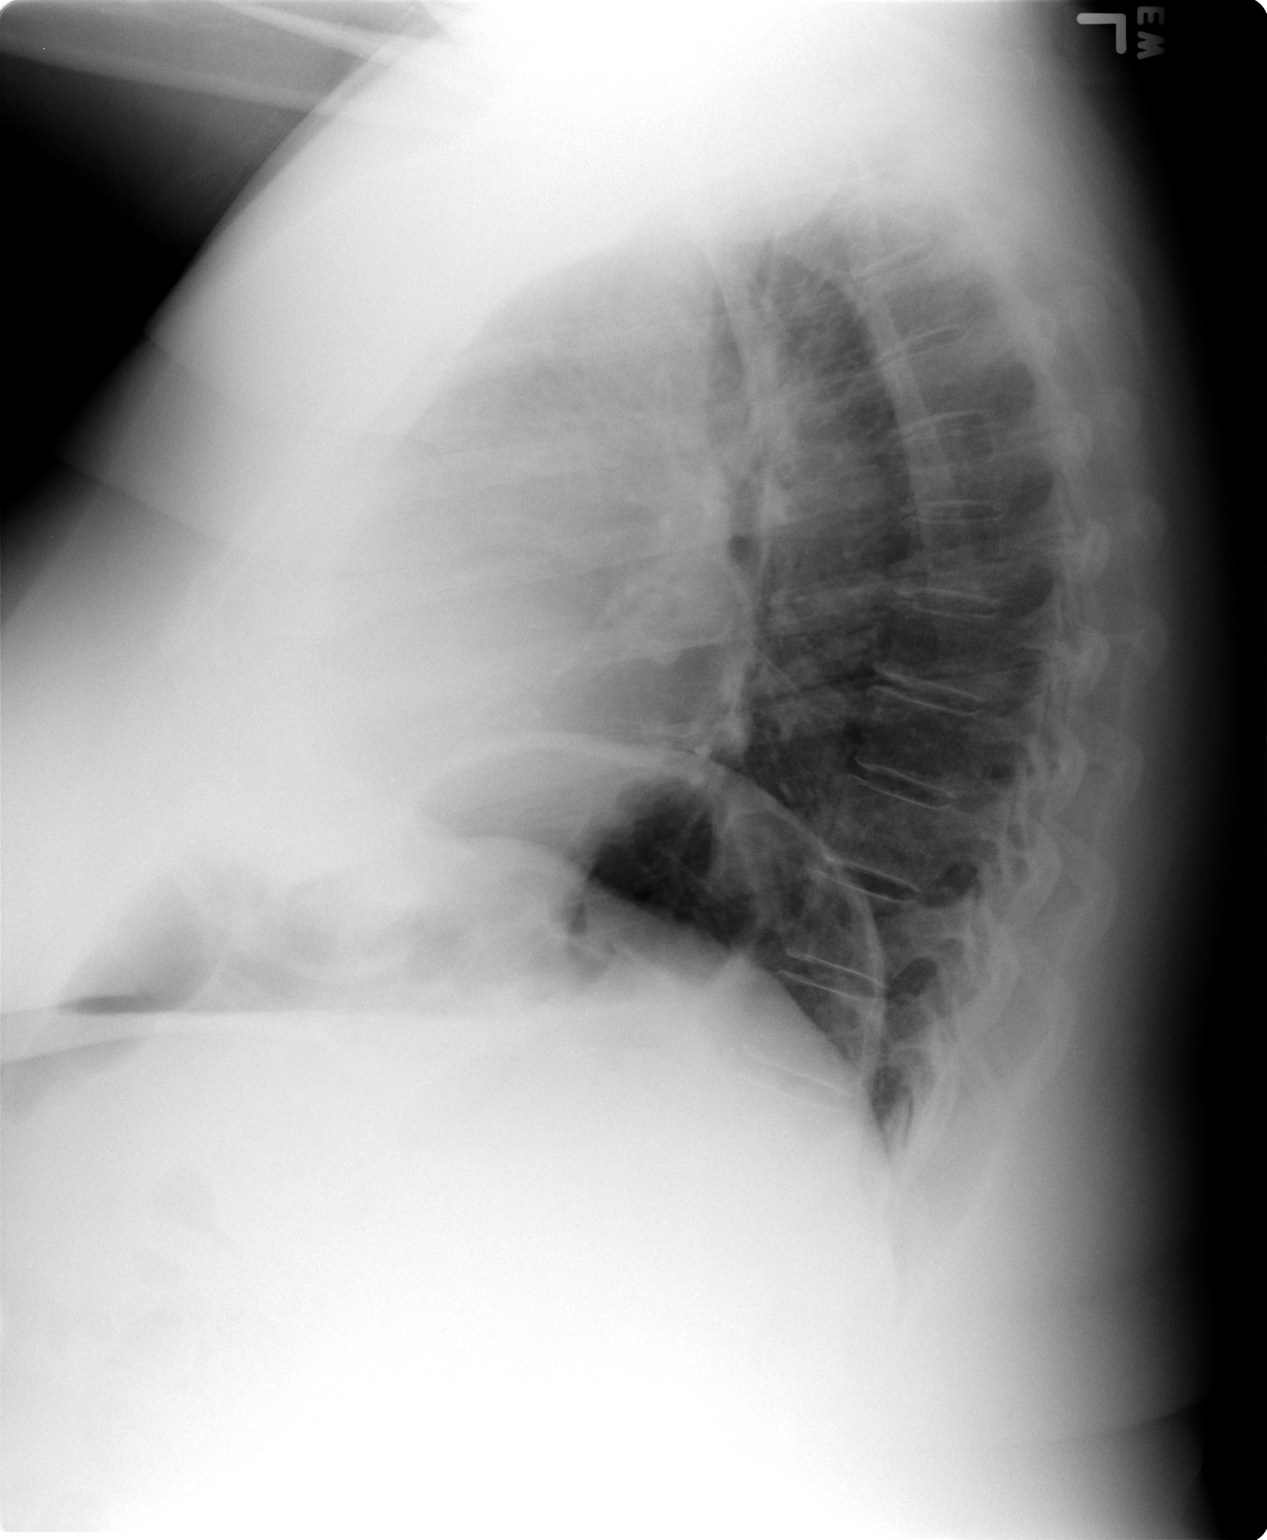

[2 of 2 positions shown; findings below may reference images not displayed]

FINDINGS: Cardiomediastinal silhouette is unremarkable.  Elevation
of the left hemidiaphragm is noted.  No acute infiltrate or
pulmonary edema.  Mild basilar atelectasis.
IMPRESSION: Elevation of the left hemidiaphragm.  No active disease.  Mild
basilar atelectasis.

## 2015-03-23 ENCOUNTER — Encounter: Payer: Self-pay | Admitting: Internal Medicine

## 2015-03-23 ENCOUNTER — Ambulatory Visit (INDEPENDENT_AMBULATORY_CARE_PROVIDER_SITE_OTHER): Payer: 59 | Admitting: Internal Medicine

## 2015-03-23 VITALS — BP 152/76 | HR 68 | Temp 97.5°F | Resp 16 | Ht 66.5 in | Wt 273.2 lb

## 2015-03-23 DIAGNOSIS — N6019 Diffuse cystic mastopathy of unspecified breast: Secondary | ICD-10-CM

## 2015-03-23 DIAGNOSIS — R7309 Other abnormal glucose: Secondary | ICD-10-CM

## 2015-03-23 DIAGNOSIS — Z1212 Encounter for screening for malignant neoplasm of rectum: Secondary | ICD-10-CM

## 2015-03-23 DIAGNOSIS — E782 Mixed hyperlipidemia: Secondary | ICD-10-CM

## 2015-03-23 DIAGNOSIS — R300 Dysuria: Secondary | ICD-10-CM

## 2015-03-23 DIAGNOSIS — Z79899 Other long term (current) drug therapy: Secondary | ICD-10-CM | POA: Insufficient documentation

## 2015-03-23 DIAGNOSIS — Z111 Encounter for screening for respiratory tuberculosis: Secondary | ICD-10-CM

## 2015-03-23 DIAGNOSIS — N951 Menopausal and female climacteric states: Secondary | ICD-10-CM

## 2015-03-23 DIAGNOSIS — IMO0001 Reserved for inherently not codable concepts without codable children: Secondary | ICD-10-CM

## 2015-03-23 DIAGNOSIS — R030 Elevated blood-pressure reading, without diagnosis of hypertension: Secondary | ICD-10-CM

## 2015-03-23 DIAGNOSIS — R5383 Other fatigue: Secondary | ICD-10-CM

## 2015-03-23 DIAGNOSIS — R7303 Prediabetes: Secondary | ICD-10-CM

## 2015-03-23 DIAGNOSIS — E559 Vitamin D deficiency, unspecified: Secondary | ICD-10-CM

## 2015-03-23 LAB — CBC WITH DIFFERENTIAL/PLATELET
BASOS ABS: 0.1 10*3/uL (ref 0.0–0.1)
Basophils Relative: 1 % (ref 0–1)
EOS PCT: 4 % (ref 0–5)
Eosinophils Absolute: 0.3 10*3/uL (ref 0.0–0.7)
HCT: 43.2 % (ref 36.0–46.0)
HEMOGLOBIN: 14.3 g/dL (ref 12.0–15.0)
LYMPHS ABS: 2.5 10*3/uL (ref 0.7–4.0)
LYMPHS PCT: 37 % (ref 12–46)
MCH: 28.5 pg (ref 26.0–34.0)
MCHC: 33.1 g/dL (ref 30.0–36.0)
MCV: 86.1 fL (ref 78.0–100.0)
MONO ABS: 0.3 10*3/uL (ref 0.1–1.0)
MPV: 9.1 fL (ref 8.6–12.4)
Monocytes Relative: 5 % (ref 3–12)
NEUTROS ABS: 3.6 10*3/uL (ref 1.7–7.7)
Neutrophils Relative %: 53 % (ref 43–77)
PLATELETS: 372 10*3/uL (ref 150–400)
RBC: 5.02 MIL/uL (ref 3.87–5.11)
RDW: 13.7 % (ref 11.5–15.5)
WBC: 6.7 10*3/uL (ref 4.0–10.5)

## 2015-03-23 MED ORDER — CIPROFLOXACIN HCL 500 MG PO TABS: 500.0000 mg | ORAL_TABLET | Freq: Two times a day (BID) | ORAL | Status: AC

## 2015-03-23 NOTE — Addendum Note (Signed)
Addended by: Unk Pinto on: 03/23/2015 08:27 PM   Modules accepted: Orders

## 2015-03-23 NOTE — Progress Notes (Signed)
Patient ID: Morgan Maddox, female   DOB: 16-Apr-1961, 54 y.o.   MRN: [id number]  Annual Comprehensive Examination  This very nice 54 y.o. SWF presents as a return new patient for complete physical.  Patient is being evaluated for labileHTN, Prediabetes, Hyperlipidemia, and Vitamin D Deficiency.    Patient has hx/o elevated BP in the past , but has avoided medical care for several days.  Patient's BP has been controlled at home and patient denies any cardiac symptoms as chest pain, palpitations, shortness of breath, dizziness or ankle swelling. Today's BP is 152/76 and rechecked at 160/100.     Patient's has hx/o elevated LDL is controlled with diet. Last lipids were at goal as above Lab Results  Component Value Date   CHOL 181 04/21/2013   HDL 61 04/21/2013   LDLCALC 97 04/21/2013   TRIG 114 04/21/2013   CHOLHDL 3.0 04/21/2013    Patient has prediabetes and patient denies reactive hypoglycemic symptoms, visual blurring, diabetic polys, or paresthesias. Last A1c was 5.7%  in June 2014.   Finally, patient is felt likely to have Vit D Deficiency based on statistical norms.   Medication Sig  . aspirin 81 MG tablet Take 81 mg by mouth daily.   Allergies  Allergen Reactions  . Benadryl [Diphenhydramine Hcl]   . Caladryl [Pramoxine-Calamine]   . Calamine    Past Medical History  Diagnosis Date  . Hypertriglyceridemia 04/21/2013  . 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 History of sinus surgery 04/21/2013  . History of mononucleosis 04/21/2013   Health Maintenance  Topic Date Due  . HIV Screening  09/19/1977  . TETANUS/TDAP  09/19/1981  . MAMMOGRAM  09/19/2012  . COLONOSCOPY  09/19/2012  . PAP SMEAR  06/05/2013  . INFLUENZA VACCINE  06/13/2015   Immunization History  Administered Date(s) Administered  . PPD Test 03/23/2015   Past Surgical History  Procedure Laterality Date  . Nasal sinus surgery    . Tonsillectomy    . Ovarian cyst surgery     Family History  Problem  Relation Age of Onset  . Heart failure Mother   .      History  Substance Use Topics  . Smoking status: Current Some Day Smoker  . Smokeless tobacco: Not on file  . Alcohol Use: 0.0 oz/week    0 Standard drinks or equivalent per week     Comment: social    ROS Constitutional: Denies fever, chills, weight loss/gain, headaches, insomnia,  night sweats, and change in appetite. Does c/o fatigue. Eyes: Denies redness, blurred vision, diplopia, discharge, itchy, watery eyes.  ENT: Denies discharge, congestion, post nasal drip, epistaxis, sore throat, earache, hearing loss, dental pain, Tinnitus, Vertigo, Sinus pain, snoring.  Cardio: Denies chest pain, palpitations, irregular heartbeat, syncope, dyspnea, diaphoresis, orthopnea, PND, claudication, edema Respiratory: denies cough, dyspnea, DOE, pleurisy, hoarseness, laryngitis, wheezing.  Gastrointestinal: Denies dysphagia, heartburn, reflux, water brash, pain, cramps, nausea, vomiting, bloating, diarrhea, constipation, hematemesis, melena, hematochezia, jaundice, hemorrhoids Genitourinary: Denies dysuria, frequency, urgency, nocturia, hesitancy, discharge, hematuria, flank pain Breast: Breast lumps, nipple discharge, bleeding.  Musculoskeletal: Denies arthralgia, myalgia, stiffness, Jt. Swelling, pain, limp, and strain/sprain. Denies falls. Skin: Denies puritis, rash, hives, warts, acne, eczema, changing in skin lesion Neuro: No weakness, tremor, incoordination, spasms, paresthesia, pain Psychiatric: Denies confusion, memory loss, sensory loss. Denies Depression. Endocrine: Denies change in weight, skin, hair change, nocturia, and paresthesia, diabetic polys, visual blurring, hyper / hypo glycemic episodes.  Heme/Lymph: No excessive bleeding, bruising, enlarged lymph nodes.  Physical  Exam  BP 152/76   Pulse 68  Temp 97.5 F Resp 16  Ht 5' 6.5"   Wt 273 lb 3.2 oz     BMI 43.44   General Appearance: Well nourished and in no apparent  distress. Eyes: PERRLA, EOMs, conjunctiva no swelling or erythema, normal fundi and vessels. Sinuses: No frontal/maxillary tenderness ENT/Mouth: EACs patent / TMs  nl. Nares clear without erythema, swelling, mucoid exudates. Oral hygiene is good. No erythema, swelling, or exudate. Tongue normal, non-obstructing. Tonsils not swollen or erythematous. Hearing normal.  Neck: Supple, thyroid normal. No bruits, nodes or JVD. Respiratory: Respiratory effort normal.  BS equal and clear bilateral without rales, rhonci, wheezing or stridor. Cardio: Heart sounds are normal with regular rate and rhythm and no murmurs, rubs or gallops. Peripheral pulses are normal and equal bilaterally without edema. No aortic or femoral bruits. Chest: symmetric with normal excursions and percussion. Breasts: Symmetric, without lumps, nipple discharge, retractions, or fibrocystic changes.  Abdomen: Flat, soft, with bowl sounds. Nontender, no guarding, rebound, hernias, masses, or organomegaly.  Lymphatics: Non tender without lymphadenopathy.  Musculoskeletal: Full ROM all peripheral extremities, joint stability, 5/5 strength, and normal gait. Skin: Warm and dry without rashes, lesions, cyanosis, clubbing or  ecchymosis.  Neuro: Cranial nerves intact, reflexes equal bilaterally. Normal muscle tone, no cerebellar symptoms. Sensation intact.  Pysch: Awake and oriented X 3, normal affect, Insight and Judgment appropriate.   Assessment and Plan  1. Elevated BP  - Microalbumin / creatinine urine ratio - EKG 12-Lead - [place], RETROPERITNL ABD,  LTD  2. Mixed hyperlipidemia  - Lipid panel  3. Prediabetes  - Hemoglobin A1c - Insulin, random  4. Vitamin D deficiency  - Vit D  25 hydroxy (rtn osteoporosis monitoring)  5. Screening for rectal cancer  - POC Hemoccult Bld/Stl (3-Cd Home Screen); Future  6. Other fatigue  - Vitamin B12 - Iron and TIBC - TSH  7. Dysuria  - Urine culture - ciprofloxacin (CIPRO) 500  MG tablet; Take 1 tablet (500 mg total) by mouth 2 (two) times daily.  Dispense: 20 tablet; Refill: 0  8. Cystic mastopathy, unspecified laterality  - MM Digital Screening; Future  9. Climacteric  - DG Bone Density; Future  10. Screening examination for pulmonary tuberculosis  - PPD  11. Medication management  - Urine Microscopic - CBC with Differential/Platelet - BASIC METABOLIC PANEL WITH GFR - Hepatic function panel - Magnesium   Continue prudent diet as discussed, weight control, BP monitoring, regular exercise, and medications. Discussed med's effects and SE's. Screening labs and tests as requested with regular follow-up as recommended. Over 40 minutes of exam, counseling, chart review was performed.

## 2015-03-23 NOTE — Patient Instructions (Signed)
Recommend Low dose or baby Aspirin 81 mg daily   To reduce risk of Colon Cancer 20 %, Skin Cancer 26 % , Melanoma 46% and   Pancreatic cancer 60%  ++++++++++++++++++ Vitamin D goal is between 70-100.   Please make sure that you are taking your Vitamin D as directed.   It is very important as a natural antiinflammatory   helping hair, skin, and nails, as well as reducing stroke and heart attack risk.   It helps your bones and helps with mood.  It also decreases numerous cancer risks so please take it as directed.   Low Vit D is associated with a 200-300% higher risk for CANCER   and 200-300% higher risk for HEART   ATTACK  &  STROKE.    .....................................[name]  It is also associated with higher death rate at younger ages,   autoimmune diseases like Rheumatoid arthritis, Lupus, Multiple Sclerosis.     Also many other serious conditions, like depression, Alzheimer's  Dementia, infertility, muscle aches, fatigue, fibromyalgia - just to name a few.  +++++++++++++++++++    Recommend the book "The END of DIETING" by Dr Excell Seltzer   & the book "The END of DIABETES " by Dr Excell Seltzer  At [email or web address] - get book & Audio CD's     Being diabetic has a  300% increased risk for heart attack, stroke, cancer, and alzheimer- type vascular dementia. It is very important that you work harder with diet by avoiding all foods that are white. Avoid white rice (brown & wild rice is OK), white potatoes (sweetpotatoes in moderation is OK), White bread or wheat bread or anything made out of white flour like bagels, donuts, rolls, buns, biscuits, cakes, pastries, cookies, pizza crust, and pasta (made from white flour & egg whites) - vegetarian pasta or spinach or wheat pasta is OK. Multigrain breads like Arnold's or [employer], or multigrain sandwich thins or flatbreads.  Diet, exercise and weight loss can reverse and cure diabetes in the early stages.  Diet, exercise and weight  loss is very important in the control and prevention of complications of diabetes which affects every system in your body, ie. Brain - dementia/stroke, eyes - glaucoma/blindness, heart - heart attack/heart failure, kidneys - dialysis, stomach - gastric paralysis, intestines - malabsorption, nerves - severe painful neuritis, circulation - gangrene & loss of a leg(s), and finally cancer and Alzheimers.    I recommend avoid fried & greasy foods,  sweets/candy, white rice (brown or wild rice or Quinoa is OK), white potatoes (sweet potatoes are OK) - anything made from white flour - bagels, doughnuts, rolls, buns, biscuits,white and wheat breads, pizza crust and traditional pasta made of white flour & egg white(vegetarian pasta or spinach or wheat pasta is OK).  Multi-grain bread is OK - like multi-grain flat bread or sandwich thins. Avoid alcohol in excess. Exercise is also important.    Eat all the vegetables you want - avoid meat, especially red meat and dairy - especially cheese.  Cheese is the most concentrated form of trans-fats which is the worst thing to clog up our arteries. Veggie cheese is OK which can be found in the fresh produce section at [facility] or [employer] or [employer]  ++++++++++++++++++++++++++  Preventive Care for Adults  A healthy lifestyle and preventive care can promote health and wellness. Preventive health guidelines for women include the following key practices.  A routine yearly physical is a good way to check with your health care  provider about your health and preventive screening. It is a chance to share any concerns and updates on your health and to receive a thorough exam.  Visit your dentist for a routine exam and preventive care every 6 months. Brush your teeth twice a day and floss once a day. Good oral hygiene prevents tooth decay and gum disease.  The frequency of eye exams is based on your age, health, family medical history, use of contact lenses, and other  factors. Follow your health care provider's recommendations for frequency of eye exams.  Eat a healthy diet. Foods like vegetables, fruits, whole grains, low-fat dairy products, and lean protein foods contain the nutrients you need without too many calories. Decrease your intake of foods high in solid fats, added sugars, and salt. Eat the right amount of calories for you.Get information about a proper diet from your health care provider, if necessary.  Regular physical exercise is one of the most important things you can do for your health. Most adults should get at least 150 minutes of moderate-intensity exercise (any activity that increases your heart rate and causes you to sweat) each week. In addition, most adults need muscle-strengthening exercises on 2 or more days a week.  Maintain a healthy weight. The body mass index (BMI) is a screening tool to identify possible weight problems. It provides an estimate of body fat based on height and weight. Your health care provider can find your BMI and can help you achieve or maintain a healthy weight.For adults 20 years and older:  A BMI below 18.5 is considered underweight.  A BMI of 18.5 to 24.9 is normal.  A BMI of 25 to 29.9 is considered overweight.  A BMI of 30 and above is considered obese.  Maintain normal blood lipids and cholesterol levels by exercising and minimizing your intake of saturated fat. Eat a balanced diet with plenty of fruit and vegetables. Blood tests for lipids and cholesterol should begin at age 51 and be repeated every 5 years. If your lipid or cholesterol levels are high, you are over 50, or you are at high risk for heart disease, you may need your cholesterol levels checked more frequently.Ongoing high lipid and cholesterol levels should be treated with medicines if diet and exercise are not working.  If you smoke, find out from your health care provider how to quit. If you do not use tobacco, do not start.  Lung  cancer screening is recommended for adults aged 85-80 years who are at high risk for developing lung cancer because of a history of smoking. A yearly low-dose CT scan of the lungs is recommended for people who have at least a 30-pack-year history of smoking and are a current smoker or have quit within the past 15 years. A pack year of smoking is smoking an average of 1 pack of cigarettes a day for 1 year (for example: 1 pack a day for 30 years or 2 packs a day for 15 years). Yearly screening should continue until the smoker has stopped smoking for at least 15 years. Yearly screening should be stopped for people who develop a health problem that would prevent them from having lung cancer treatment.  High blood pressure causes heart disease and increases the risk of stroke. Your blood pressure should be checked at least every 1 to 2 years. Ongoing high blood pressure should be treated with medicines if weight loss and exercise do not work.  If you are 9-35 years old, ask your  health care provider if you should take aspirin to prevent strokes.  Diabetes screening involves taking a blood sample to check your fasting blood sugar level. This should be done once every 3 years, after age 36, if you are within normal weight and without risk factors for diabetes. Testing should be considered at a younger age or be carried out more frequently if you are overweight and have at least 1 risk factor for diabetes.  Breast cancer screening is essential preventive care for women. You should practice "breast self-awareness." This means understanding the normal appearance and feel of your breasts and may include breast self-examination. Any changes detected, no matter how small, should be reported to a health care provider. Women in their 89s and 30s should have a clinical breast exam (CBE) by a health care provider as part of a regular health exam every 1 to 3 years. After age 51, women should have a CBE every year. Starting  at age 109, women should consider having a mammogram (breast X-ray test) every year. Women who have a family history of breast cancer should talk to their health care provider about genetic screening. Women at a high risk of breast cancer should talk to their health care providers about having an MRI and a mammogram every year.  Breast cancer gene (BRCA)-related cancer risk assessment is recommended for women who have family members with BRCA-related cancers. BRCA-related cancers include breast, ovarian, tubal, and peritoneal cancers. Having family members with these cancers may be associated with an increased risk for harmful changes (mutations) in the breast cancer genes BRCA1 and BRCA2. Results of the assessment will determine the need for genetic counseling and BRCA1 and BRCA2 testing.  Routine pelvic exams to screen for cancer are no longer recommended for nonpregnant women who are considered low risk for cancer of the pelvic organs (ovaries, uterus, and vagina) and who do not have symptoms. Ask your health care provider if a screening pelvic exam is right for you.  If you have had past treatment for cervical cancer or a condition that could lead to cancer, you need Pap tests and screening for cancer for at least 20 years after your treatment. If Pap tests have been discontinued, your risk factors (such as having a new sexual partner) need to be reassessed to determine if screening should be resumed. Some women have medical problems that increase the chance of getting cervical cancer. In these cases, your health care provider may recommend more frequent screening and Pap tests.  Colorectal cancer can be detected and often prevented. Most routine colorectal cancer screening begins at the age of 66 years and continues through age 72 years. However, your health care provider may recommend screening at an earlier age if you have risk factors for colon cancer. On a yearly basis, your health care provider may  provide home test kits to check for hidden blood in the stool. Use of a small camera at the end of a tube, to directly examine the colon (sigmoidoscopy or colonoscopy), can detect the earliest forms of colorectal cancer. Talk to your health care provider about this at age 36, when routine screening begins. Direct exam of the colon should be repeated every 5-10 years through age 70 years, unless early forms of pre-cancerous polyps or small growths are found.  Hepatitis C blood testing is recommended for all people born from [birth year] through 1965 and any individual with known risks for hepatitis C.  Pra  Osteoporosis is a disease in which the  bones lose minerals and strength with aging. This can result in serious bone fractures or breaks. The risk of osteoporosis can be identified using a bone density scan. Women ages 64 years and over and women at risk for fractures or osteoporosis should discuss screening with their health care providers. Ask your health care provider whether you should take a calcium supplement or vitamin D to reduce the rate of osteoporosis.  Menopause can be associated with physical symptoms and risks. Hormone replacement therapy is available to decrease symptoms and risks. You should talk to your health care provider about whether hormone replacement therapy is right for you.  Use sunscreen. Apply sunscreen liberally and repeatedly throughout the day. You should seek shade when your shadow is shorter than you. Protect yourself by wearing long sleeves, pants, a wide-brimmed hat, and sunglasses year round, whenever you are outdoors.  Once a month, do a whole body skin exam, using a mirror to look at the skin on your back. Tell your health care provider of new moles, moles that have irregular borders, moles that are larger than a pencil eraser, or moles that have changed in shape or color.  Stay current with required vaccines (immunizations).  Influenza vaccine. All adults should be  immunized every year.  Tetanus, diphtheria, and acellular pertussis (Td, Tdap) vaccine. Pregnant women should receive 1 dose of Tdap vaccine during each pregnancy. The dose should be obtained regardless of the length of time since the last dose. Immunization is preferred during the 27th-36th week of gestation. An adult who has not previously received Tdap or who does not know her vaccine status should receive 1 dose of Tdap. This initial dose should be followed by tetanus and diphtheria toxoids (Td) booster doses every 10 years. Adults with an unknown or incomplete history of completing a 3-dose immunization series with Td-containing vaccines should begin or complete a primary immunization series including a Tdap dose. Adults should receive a Td booster every 10 years.  Varicella vaccine. An adult without evidence of immunity to varicella should receive 2 doses or a second dose if she has previously received 1 dose. Pregnant females who do not have evidence of immunity should receive the first dose after pregnancy. This first dose should be obtained before leaving the health care facility. The second dose should be obtained 4-8 weeks after the first dose.  Human papillomavirus (HPV) vaccine. Females aged 13-26 years who have not received the vaccine previously should obtain the 3-dose series. The vaccine is not recommended for use in pregnant females. However, pregnancy testing is not needed before receiving a dose. If a female is found to be pregnant after receiving a dose, no treatment is needed. In that case, the remaining doses should be delayed until after the pregnancy. Immunization is recommended for any person with an immunocompromised condition through the age of 36 years if she did not get any or all doses earlier. During the 3-dose series, the second dose should be obtained 4-8 weeks after the first dose. The third dose should be obtained 24 weeks after the first dose and 16 weeks after the second  dose.  Zoster vaccine. One dose is recommended for adults aged 35 years or older unless certain conditions are present.  Measles, mumps, and rubella (MMR) vaccine. Adults born before [birth year] generally are considered immune to measles and mumps. Adults born in [birth year] or later should have 1 or more doses of MMR vaccine unless there is a contraindication to the vaccine or there is  laboratory evidence of immunity to each of the three diseases. A routine second dose of MMR vaccine should be obtained at least 28 days after the first dose for students attending postsecondary schools, health care workers, or international travelers. People who received inactivated measles vaccine or an unknown type of measles vaccine during 1963-1967 should receive 2 doses of MMR vaccine. People who received inactivated mumps vaccine or an unknown type of mumps vaccine before 1979 and are at high risk for mumps infection should consider immunization with 2 doses of MMR vaccine. For females of childbearing age, rubella immunity should be determined. If there is no evidence of immunity, females who are not pregnant should be vaccinated. If there is no evidence of immunity, females who are pregnant should delay immunization until after pregnancy. Unvaccinated health care workers born before [birth year] who lack laboratory evidence of measles, mumps, or rubella immunity or laboratory confirmation of disease should consider measles and mumps immunization with 2 doses of MMR vaccine or rubella immunization with 1 dose of MMR vaccine.  Pneumococcal 13-valent conjugate (PCV13) vaccine. When indicated, a person who is uncertain of her immunization history and has no record of immunization should receive the PCV13 vaccine. An adult aged 33 years or older who has certain medical conditions and has not been previously immunized should receive 1 dose of PCV13 vaccine. This PCV13 should be followed with a dose of pneumococcal polysaccharide (PPSV23) vaccine.  The PPSV23 vaccine dose should be obtained at least 8 weeks after the dose of PCV13 vaccine. An adult aged 80 years or older who has certain medical conditions and previously received 1 or more doses of PPSV23 vaccine should receive 1 dose of PCV13. The PCV13 vaccine dose should be obtained 1 or more years after the last PPSV23 vaccine dose.    Pneumococcal polysaccharide (PPSV23) vaccine. When PCV13 is also indicated, PCV13 should be obtained first. All adults aged 51 years and older should be immunized. An adult younger than age 33 years who has certain medical conditions should be immunized. Any person who resides in a nursing home or long-term care facility should be immunized. An adult smoker should be immunized. People with an immunocompromised condition and certain other conditions should receive both PCV13 and PPSV23 vaccines. People with human immunodeficiency virus (HIV) infection should be immunized as soon as possible after diagnosis. Immunization during chemotherapy or radiation therapy should be avoided. Routine use of PPSV23 vaccine is not recommended for American Indians, [place] Natives, or people younger than 65 years unless there are medical conditions that require PPSV23 vaccine. When indicated, people who have unknown immunization and have no record of immunization should receive PPSV23 vaccine. One-time revaccination 5 years after the first dose of PPSV23 is recommended for people aged 19-64 years who have chronic kidney failure, nephrotic syndrome, asplenia, or immunocompromised conditions. People who received 1-2 doses of PPSV23 before age 66 years should receive another dose of PPSV23 vaccine at age 18 years or later if at least 5 years have passed since the previous dose. Doses of PPSV23 are not needed for people immunized with PPSV23 at or after age 55 years.  Preventive Services / Frequency   Ages 24 to 24 years  Blood pressure check.  Lipid and cholesterol check.  Lung  cancer screening. / Every year if you are aged 23-80 years and have a 30-pack-year history of smoking and currently smoke or have quit within the past 15 years. Yearly screening is stopped once you have quit smoking for at  least 15 years or develop a health problem that would prevent you from having lung cancer treatment.  Clinical breast exam.** / Every year after age 73 years.  BRCA-related cancer risk assessment.** / For women who have family members with a BRCA-related cancer (breast, ovarian, tubal, or peritoneal cancers).  Mammogram.** / Every year beginning at age 52 years and continuing for as long as you are in good health. Consult with your health care provider.  Pap test.** / Every 3 years starting at age 63 years through age 68 or 24 years with a history of 3 consecutive normal Pap tests.  HPV screening.** / Every 3 years from ages 5 years through ages 77 to 17 years with a history of 3 consecutive normal Pap tests.  Fecal occult blood test (FOBT) of stool. / Every year beginning at age 35 years and continuing until age 57 years. You may not need to do this test if you get a colonoscopy every 10 years.  Flexible sigmoidoscopy or colonoscopy.** / Every 5 years for a flexible sigmoidoscopy or every 10 years for a colonoscopy beginning at age 24 years and continuing until age 32 years.  Hepatitis C blood test.** / For all people born from [birth year] through 1964 and any individual with known risks for hepatitis C.  Skin self-exam. / Monthly.  Influenza vaccine. / Every year.  Tetanus, diphtheria, and acellular pertussis (Tdap/Td) vaccine.** / Consult your health care provider. Pregnant women should receive 1 dose of Tdap vaccine during each pregnancy. 1 dose of Td every 10 years.  Varicella vaccine.** / Consult your health care provider. Pregnant females who do not have evidence of immunity should receive the first dose after pregnancy.  Zoster vaccine.** / 1 dose for adults aged 107  years or older.  Pneumococcal 13-valent conjugate (PCV13) vaccine.** / Consult your health care provider.  Pneumococcal polysaccharide (PPSV23) vaccine.** / 1 to 2 doses if you smoke cigarettes or if you have certain conditions.  Meningococcal vaccine.** / Consult your health care provider.  Hepatitis A vaccine.** / Consult your health care provider.  Hepatitis B vaccine.** / Consult your health care provider. Screening for abdominal aortic aneurysm (AAA)  by ultrasound is recommended for people over 50 who have history of high blood pressure or who are current or former smokers.

## 2015-03-24 ENCOUNTER — Other Ambulatory Visit: Payer: Self-pay

## 2015-03-24 ENCOUNTER — Other Ambulatory Visit: Payer: Self-pay | Admitting: Internal Medicine

## 2015-03-24 DIAGNOSIS — Z780 Asymptomatic menopausal state: Secondary | ICD-10-CM

## 2015-03-24 DIAGNOSIS — Z1231 Encounter for screening mammogram for malignant neoplasm of breast: Secondary | ICD-10-CM

## 2015-03-24 LAB — MICROALBUMIN / CREATININE URINE RATIO: CREATININE, URINE: 69 mg/dL

## 2015-03-24 LAB — TSH: TSH: 1.749 u[IU]/mL (ref 0.350–4.500)

## 2015-03-24 LAB — URINALYSIS, MICROSCOPIC ONLY
CASTS: NONE SEEN
CRYSTALS: NONE SEEN
SQUAMOUS EPITHELIAL / LPF: NONE SEEN

## 2015-03-24 LAB — IRON AND TIBC
%SAT: 28 % (ref 20–55)
IRON: 116 ug/dL (ref 42–145)
TIBC: 419 ug/dL (ref 250–470)
UIBC: 303 ug/dL (ref 125–400)

## 2015-03-24 LAB — BASIC METABOLIC PANEL WITH GFR
BUN: 10 mg/dL (ref 6–23)
CALCIUM: 9.2 mg/dL (ref 8.4–10.5)
CO2: 26 mEq/L (ref 19–32)
Chloride: 100 mEq/L (ref 96–112)
Creat: 0.79 mg/dL (ref 0.50–1.10)
GFR, EST NON AFRICAN AMERICAN: 86 mL/min
GLUCOSE: 97 mg/dL (ref 70–99)
Potassium: 4.5 mEq/L (ref 3.5–5.3)
SODIUM: 138 meq/L (ref 135–145)

## 2015-03-24 LAB — HEPATIC FUNCTION PANEL
ALT: 17 U/L (ref 0–35)
AST: 14 U/L (ref 0–37)
Albumin: 4 g/dL (ref 3.5–5.2)
Alkaline Phosphatase: 82 U/L (ref 39–117)
Bilirubin, Direct: 0.1 mg/dL (ref 0.0–0.3)
Indirect Bilirubin: 0.4 mg/dL (ref 0.2–1.2)
TOTAL PROTEIN: 6.6 g/dL (ref 6.0–8.3)
Total Bilirubin: 0.5 mg/dL (ref 0.2–1.2)

## 2015-03-24 LAB — LIPID PANEL
Cholesterol: 183 mg/dL (ref 0–200)
HDL: 70 mg/dL (ref >=46)
LDL CALC: 87 mg/dL (ref 0–99)
TRIGLYCERIDES: 132 mg/dL (ref <150)
Total CHOL/HDL Ratio: 2.6 Ratio
VLDL: 26 mg/dL (ref 0–40)

## 2015-03-24 LAB — MAGNESIUM: MAGNESIUM: 1.9 mg/dL (ref 1.5–2.5)

## 2015-03-24 LAB — HEMOGLOBIN A1C
HEMOGLOBIN A1C: 5.9 % — AB (ref <5.7)
Mean Plasma Glucose: 123 mg/dL — ABNORMAL HIGH (ref <117)

## 2015-03-24 LAB — VITAMIN B12: Vitamin B-12: 245 pg/mL (ref 211–911)

## 2015-03-24 LAB — VITAMIN D 25 HYDROXY (VIT D DEFICIENCY, FRACTURES): VIT D 25 HYDROXY: 20 ng/mL — AB (ref 30–100)

## 2015-03-24 LAB — INSULIN, RANDOM: Insulin: 6.2 u[IU]/mL (ref 2.0–19.6)

## 2015-04-04 ENCOUNTER — Ambulatory Visit
Admission: RE | Admit: 2015-04-04 | Discharge: 2015-04-04 | Disposition: A | Discharge status: Home / Self Care | Payer: 59 | Source: Ambulatory Visit | Attending: Internal Medicine | Admitting: Internal Medicine

## 2015-04-04 DIAGNOSIS — Z780 Asymptomatic menopausal state: Secondary | ICD-10-CM

## 2015-04-04 DIAGNOSIS — Z1231 Encounter for screening mammogram for malignant neoplasm of breast: Secondary | ICD-10-CM

## 2015-04-13 ENCOUNTER — Telehealth: Payer: Self-pay | Admitting: *Deleted

## 2015-04-13 NOTE — Telephone Encounter (Signed)
Pt aware of lab results of normal BMD and MGM.

## 2015-04-19 ENCOUNTER — Ambulatory Visit: Payer: Self-pay | Admitting: Internal Medicine

## 2015-04-19 ENCOUNTER — Encounter: Payer: Self-pay | Admitting: Internal Medicine

## 2015-04-19 ENCOUNTER — Ambulatory Visit: Payer: 59 | Admitting: Internal Medicine

## 2015-04-19 ENCOUNTER — Other Ambulatory Visit (HOSPITAL_COMMUNITY)
Admission: RE | Admit: 2015-04-19 | Discharge: 2015-04-19 | Disposition: A | Discharge status: Home / Self Care | Payer: 59 | Source: Ambulatory Visit | Attending: Internal Medicine | Admitting: Internal Medicine

## 2015-04-19 VITALS — BP 136/84 | HR 84 | Temp 98.4°F | Resp 18 | Ht 66.5 in | Wt 275.0 lb

## 2015-04-19 DIAGNOSIS — R87810 Cervical high risk human papillomavirus (HPV) DNA test positive: Secondary | ICD-10-CM | POA: Insufficient documentation

## 2015-04-19 DIAGNOSIS — Z124 Encounter for screening for malignant neoplasm of cervix: Secondary | ICD-10-CM

## 2015-04-19 DIAGNOSIS — Z01419 Encounter for gynecological examination (general) (routine) without abnormal findings: Secondary | ICD-10-CM | POA: Insufficient documentation

## 2015-04-19 DIAGNOSIS — Z1151 Encounter for screening for human papillomavirus (HPV): Secondary | ICD-10-CM | POA: Diagnosis present

## 2015-04-19 DIAGNOSIS — R300 Dysuria: Secondary | ICD-10-CM

## 2015-04-19 NOTE — Patient Instructions (Signed)
Dr. Earlean Shawl is the GI doctor I was telling you about for your colonoscopy.  Please contact them.  You can find him on google.  Urinary Incontinence Urinary incontinence is the involuntary loss of urine from your bladder. CAUSES  There are many causes of urinary incontinence. They include:  Medicines.  Infections.  Prostatic enlargement, leading to overflow of urine from your bladder.  Surgery.  Neurological diseases.  Emotional factors. SIGNS AND SYMPTOMS Urinary Incontinence can be divided into four types: 1. Urge incontinence. Urge incontinence is the involuntary loss of urine before you have the opportunity to go to the bathroom. There is a sudden urge to void but not enough time to reach a bathroom. 2. Stress incontinence. Stress incontinence is the sudden loss of urine with any activity that forces urine to pass. It is commonly caused by anatomical changes to the pelvis and sphincter areas of your body. 3. Overflow incontinence. Overflow incontinence is the loss of urine from an obstructed opening to your bladder. This results in a backup of urine and a resultant buildup of pressure within the bladder. When the pressure within the bladder exceeds the closing pressure of the sphincter, the urine overflows, which causes incontinence, similar to water overflowing a dam. 4. Total incontinence. Total incontinence is the loss of urine as a result of the inability to store urine within your bladder. DIAGNOSIS  Evaluating the cause of incontinence may require:  A thorough and complete medical and obstetric history.  A complete physical exam.  Laboratory tests such as a urine culture and sensitivities. When additional tests are indicated, they can include:  An ultrasound exam.  Kidney and bladder X-rays.  Cystoscopy. This is an exam of the bladder using a narrow scope.  Urodynamic testing to test the nerve function to the bladder and sphincter areas. TREATMENT  Treatment for  urinary incontinence depends on the cause:  For urge incontinence caused by a bacterial infection, antibiotics will be prescribed. If the urge incontinence is related to medicines you take, your health care provider may have you change the medicine.  For stress incontinence, surgery to re-establish anatomical support to the bladder or sphincter, or both, will often correct the condition.  For overflow incontinence caused by an enlarged prostate, an operation to open the channel through the enlarged prostate will allow the flow of urine out of the bladder. In women with fibroids, a hysterectomy may be recommended.  For total incontinence, surgery on your urinary sphincter may help. An artificial urinary sphincter (an inflatable cuff placed around the urethra) may be required. In women who have developed a hole-like passage between their bladder and vagina (vesicovaginal fistula), surgery to close the fistula often is required. HOME CARE INSTRUCTIONS  Normal daily hygiene and the use of pads or adult diapers that are changed regularly will help prevent odors and skin damage.  Avoid caffeine. It can overstimulate your bladder.  Use the bathroom regularly. Try about every 2-3 hours to go to the bathroom, even if you do not feel the need to do so. Take time to empty your bladder completely. After urinating, wait a minute. Then try to urinate again.  For causes involving nerve dysfunction, keep a log of the medicines you take and a journal of the times you go to the bathroom. SEEK MEDICAL CARE IF:  You experience worsening of pain instead of improvement in pain after your procedure.  Your incontinence becomes worse instead of better. SEE IMMEDIATE MEDICAL CARE IF:  You experience fever  or shaking chills.  You are unable to pass your urine.  You have redness spreading into your groin or down into your thighs. MAKE SURE YOU:   Understand these instructions.   Will watch your  condition.  Will get help right away if you are not doing well or get worse. Document Released: 12/06/2004 Document Revised: 08/19/2013 Document Reviewed: 04/07/2013 [facility] Patient Information 2015 [place], [place]. This information is not intended to replace advice given to you by your health care provider. Make sure you discuss any questions you have with your health care provider.

## 2015-04-19 NOTE — Progress Notes (Signed)
   Subjective:    Patient ID: Morgan Maddox, female    DOB: Oct 28, 1961, 54 y.o.   MRN: [id number]  HPI  Patient reports to the office for evaluation of pap smear and recheck of a urine.  She reports that she still has to urinate a lot after finished the cipro and she reports that she has a lot of frequency and urgency.  She reports that her daughter reports that her urine still smells.    Patient does report that she has had a hysterectomy and does not fallopian tubes or most of her ovaries.  She does still have a uterus and cervix.  She reports that it has been around 10 years or more.  She has no history of abnormal pap smear that she is aware of.   She reports that she has a lot of urge incontinence.     Review of Systems  Constitutional: Negative for fever, chills and fatigue.  Genitourinary: Positive for urgency and frequency. Negative for dysuria, hematuria, vaginal bleeding, vaginal discharge and vaginal pain.       Objective:   Physical Exam  Constitutional: She appears well-developed and well-nourished. No distress.  HENT:  Head: Normocephalic and atraumatic.  Mouth/Throat: Oropharynx is clear and moist. No oropharyngeal exudate.  Eyes: Conjunctivae are normal. No scleral icterus.  Neck: Normal range of motion. Neck supple. No JVD present. No thyromegaly present.  Cardiovascular: Normal rate, regular rhythm, normal heart sounds and intact distal pulses.  Exam reveals no gallop and no friction rub.   No murmur heard. Pulmonary/Chest: Effort normal and breath sounds normal. No respiratory distress. She has no wheezes. She has no rales. She exhibits no tenderness.  Genitourinary: Vagina normal and uterus normal. No labial fusion. There is no rash, tenderness, lesion or injury on the right labia. There is no rash, tenderness, lesion or injury on the left labia. Cervix exhibits no motion tenderness, no discharge and no friability. No erythema, tenderness or bleeding in the vagina.  No foreign body around the vagina. No signs of injury around the vagina. No vaginal discharge found.  Ovaries surgically absent  Lymphadenopathy:    She has no cervical adenopathy.  Skin: Skin is warm and dry. She is not diaphoretic.  Psychiatric: She has a normal mood and affect. Her behavior is normal. Judgment and thought content normal.  Nursing note and vitals reviewed.   Filed Vitals:   04/19/15 1633  BP: 136/84  Pulse: 84  Temp: 98.4 F (36.9 C)  Resp: 18         Assessment & Plan:    1. Dysuria  - WET PREP BY MOLECULAR PROBE - Urinalysis, Routine w reflex microscopic (not at [facility]) - Culture, Urine  2. Cervical cancer screening  - Cytology - PAP

## 2015-04-20 LAB — URINALYSIS, ROUTINE W REFLEX MICROSCOPIC
BILIRUBIN URINE: NEGATIVE
Glucose, UA: NEGATIVE mg/dL
Hgb urine dipstick: NEGATIVE
KETONES UR: NEGATIVE mg/dL
LEUKOCYTES UA: NEGATIVE
Nitrite: NEGATIVE
PH: 5.5 (ref 5.0–8.0)
PROTEIN: NEGATIVE mg/dL
SPECIFIC GRAVITY, URINE: 1.02 (ref 1.005–1.030)
UROBILINOGEN UA: 0.2 mg/dL (ref 0.0–1.0)

## 2015-04-20 LAB — WET PREP BY MOLECULAR PROBE
CANDIDA SPECIES: NEGATIVE
Gardnerella vaginalis: NEGATIVE
TRICHOMONAS VAG: NEGATIVE

## 2015-04-21 LAB — CYTOLOGY - PAP

## 2015-04-21 LAB — URINE CULTURE

## 2015-07-25 ENCOUNTER — Ambulatory Visit (INDEPENDENT_AMBULATORY_CARE_PROVIDER_SITE_OTHER): Payer: 59 | Admitting: Internal Medicine

## 2015-07-25 ENCOUNTER — Encounter: Payer: Self-pay | Admitting: Internal Medicine

## 2015-07-25 VITALS — BP 116/70 | HR 84 | Temp 98.0°F | Resp 18 | Ht 66.5 in | Wt 271.0 lb

## 2015-07-25 DIAGNOSIS — J069 Acute upper respiratory infection, unspecified: Secondary | ICD-10-CM | POA: Diagnosis not present

## 2015-07-25 MED ORDER — PROMETHAZINE-DM 6.25-15 MG/5ML PO SYRP: ORAL_SOLUTION | ORAL | Status: DC

## 2015-07-25 MED ORDER — MOMETASONE FUROATE 50 MCG/ACT NA SUSP: 2.0000 | Freq: Every day | NASAL | Status: DC

## 2015-07-25 MED ORDER — DEXAMETHASONE SODIUM PHOSPHATE 100 MG/10ML IJ SOLN
10.0000 mg | Freq: Once | INTRAMUSCULAR | Status: AC
  Administered 2015-07-25: 10 mg via INTRAMUSCULAR

## 2015-07-25 MED ORDER — DOXYCYCLINE HYCLATE 100 MG PO CAPS: 100.0000 mg | ORAL_CAPSULE | Freq: Two times a day (BID) | ORAL | Status: DC

## 2015-07-25 NOTE — Progress Notes (Signed)
Patient ID: Morgan Maddox, female   DOB: 10-25-1961, 54 y.o.   MRN: [id number]  HPI  Patient presents to the office for evaluation of cough.  It has been going on for 1 months.  Patient reports wet cough with some sputum production.  They also endorse change in voice, postnasal drip and sinus pressure, congestion, tinnitus, running eyes, sneezing..  They have tried claritin, allegra, saline, and chloraseden.  They report that nothing has worked.  They denies other sick contacts.  She reports that she does tend to have allergies.  She has had a history of multiple nasal surgeries.  She does smoke still.    Review of Systems  Constitutional: Positive for malaise/fatigue. Negative for fever and chills.  HENT: Positive for congestion, ear pain, sore throat and tinnitus.   Eyes: Positive for discharge.  Respiratory: Positive for cough, sputum production and wheezing. Negative for shortness of breath.   Cardiovascular: Negative for chest pain, palpitations and leg swelling.  Skin: Negative.   Neurological: Positive for headaches.    PE:  Filed Vitals:   07/25/15 0933  BP: 116/70  Pulse: 84  Temp: 98 F (36.7 C)  Resp: 18    General:  Alert and non-toxic, WDWN, NAD HEENT: NCAT, PERLA, EOM normal, no occular discharge or erythema.  Nasal mucosal edema with sinus tenderness to palpattion.  Oropharynx clear with minimal oropharyngeal edema and erythema.  Mucous membranes moist and pink. Neck:  Cervical adenopathy Chest:  RRR no MRGs.  Lungs clear to auscultation A&P with no wheezes rhonchi or rales.   Abdomen: +BS x 4 quadrants, soft, non-tender, no guarding, rigidity, or rebound. Skin: warm and dry no rash Neuro: A&Ox4, CN II-XII grossly intact  Assessment and Plan:   1. Acute URI -doxycycline -nasal saline -allegra -pepcid or zantac -phenergan robitussin syrup -decadron shot.

## 2015-07-25 NOTE — Patient Instructions (Addendum)
Please start taking nasonex 2 sprays per each nostril right before bedtime.    Please continue to take allegra daily.  Use saline spray in your nose as often as tolerated.    You can take either pepcid or zantac once in the morning and once in the evening.   Please take the doxycycline until it is all the way gone.  Use hydrogen peroxide drops in your ear to prevent wax build up.

## 2015-07-25 NOTE — Addendum Note (Signed)
Addended by: Britta Louth A on: 07/25/2015 10:25 AM   Modules accepted: Orders

## 2015-08-25 ENCOUNTER — Other Ambulatory Visit: Payer: Self-pay | Admitting: *Deleted

## 2015-08-25 MED ORDER — MOMETASONE FUROATE 50 MCG/ACT NA SUSP: 2.0000 | Freq: Every day | NASAL | Status: DC

## 2015-08-25 MED ORDER — DOXYCYCLINE HYCLATE 100 MG PO CAPS: 100.0000 mg | ORAL_CAPSULE | Freq: Two times a day (BID) | ORAL | Status: DC

## 2015-08-25 MED ORDER — PROMETHAZINE-DM 6.25-15 MG/5ML PO SYRP: ORAL_SOLUTION | ORAL | Status: DC

## 2015-09-30 ENCOUNTER — Ambulatory Visit: Payer: 59 | Admitting: Internal Medicine

## 2015-10-02 NOTE — Progress Notes (Signed)
Patient ID: Morgan Maddox, female   DOB: 02-06-1961, 54 y.o.   MRN: [id number] [name] ...........[facility]

## 2015-12-20 ENCOUNTER — Emergency Department
Admission: EM | Admit: 2015-12-20 | Discharge: 2015-12-20 | Disposition: A | Discharge status: Home / Self Care | Payer: BLUE CROSS/BLUE SHIELD | Source: Home / Self Care | Attending: Family Medicine | Admitting: Family Medicine

## 2015-12-20 ENCOUNTER — Encounter: Payer: Self-pay | Admitting: *Deleted

## 2015-12-20 DIAGNOSIS — J040 Acute laryngitis: Secondary | ICD-10-CM | POA: Diagnosis not present

## 2015-12-20 DIAGNOSIS — J0101 Acute recurrent maxillary sinusitis: Secondary | ICD-10-CM | POA: Diagnosis not present

## 2015-12-20 DIAGNOSIS — K120 Recurrent oral aphthae: Secondary | ICD-10-CM | POA: Diagnosis not present

## 2015-12-20 MED ORDER — DOXYCYCLINE HYCLATE 100 MG PO CAPS: 100.0000 mg | ORAL_CAPSULE | Freq: Two times a day (BID) | ORAL | Status: DC

## 2015-12-20 NOTE — Discharge Instructions (Signed)
You may take 400-600mg  Ibuprofen (Motrin) every 6-8 hours for fever and pain  Alternate with Tylenol  You may take 500mg  Tylenol every 4-6 hours as needed for fever and pain  Follow-up with your primary care provider next week for recheck of symptoms if not improving.  Be sure to drink plenty of fluids and rest, at least 8hrs of sleep a night, preferably more while you are sick. Return urgent care or go to closest ER if you cannot keep down fluids/signs of dehydration, fever not reducing with Tylenol, difficulty breathing/wheezing, stiff neck, worsening condition, or other concerns (see below)  Please take antibiotics as prescribed and be sure to complete entire course even if you start to feel better to ensure infection does not come back.   Laryngitis Laryngitis is inflammation of your vocal cords. This causes hoarseness, coughing, loss of voice, sore throat, or a dry throat. Your vocal cords are two bands of muscles that are found in your throat. When you speak, these cords come together and vibrate. These vibrations come out through your mouth as sound. When your vocal cords are inflamed, your voice sounds different. Laryngitis can be temporary (acute) or long-term (chronic). Most cases of acute laryngitis improve with time. Chronic laryngitis is laryngitis that lasts for more than three weeks. CAUSES Acute laryngitis may be caused by:  A viral infection.  Lots of talking, yelling, or singing. This is also called vocal strain.  Bacterial infections. Chronic laryngitis may be caused by:  Vocal strain.  Injury to your vocal cords.  Acid reflux (gastroesophageal reflux disease or GERD).  Allergies.  Sinus infection.  Smoking.  Alcohol abuse.  Breathing in chemicals or dust.  Growths on the vocal cords. RISK FACTORS Risk factors for laryngitis include:  Smoking.  Alcohol abuse.  Having allergies. SIGNS AND SYMPTOMS Symptoms of laryngitis may include:  Low, hoarse  voice.  Loss of voice.  Dry cough.  Sore throat.  Stuffy nose. DIAGNOSIS Laryngitis may be diagnosed by:  Physical exam.  Throat culture.  Blood test.  Laryngoscopy. This procedure allows your health care provider to look at your vocal cords with a mirror or viewing tube. TREATMENT Treatment for laryngitis depends on what is causing it. Usually, treatment involves resting your voice and using medicines to soothe your throat. However, if your laryngitis is caused by a bacterial infection, you may need to take antibiotic medicine. If your laryngitis is caused by a growth, you may need to have a procedure to remove it. HOME CARE INSTRUCTIONS  Drink enough fluid to keep your urine clear or pale yellow.  Breathe in moist air. Use a humidifier if you live in a dry climate.  Take medicines only as directed by your health care provider.  If you were prescribed an antibiotic medicine, finish it all even if you start to feel better.  Do not smoke cigarettes or electronic cigarettes. If you need help quitting, ask your health care provider.  Talk as little as possible. Also avoid whispering, which can cause vocal strain.  Write instead of talking. Do this until your voice is back to normal. SEEK MEDICAL CARE IF:  You have a fever.  You have increasing pain.  You have difficulty swallowing. SEEK IMMEDIATE MEDICAL CARE IF:  You cough up blood.  You have trouble breathing.   This information is not intended to replace advice given to you by your health care provider. Make sure you discuss any questions you have with your health care provider.  Document Released: 10/29/2005 Document Revised: 11/19/2014 Document Reviewed: 04/13/2014 Elsevier Interactive Patient Education 2016 [place] Canker sores are small, painful sores that develop inside your mouth. They may also be called aphthous ulcers. You can get canker sores on the inside of your lips or cheeks,  on your tongue, or anywhere inside your mouth. You can have just one canker sore or several of them. Canker sores cannot be passed from one person to another (noncontagious). These sores are different than the sores that you may get on the outside of your lips (cold sores or fever blisters). Canker sores usually start as painful red bumps. Then they turn into small white, yellow, or gray ulcers that have red borders. The ulcers may be quite painful. The pain may be worse when you eat or drink. CAUSES The cause of this condition is not known. RISK FACTORS This condition is more likely to develop in:  Women.  People in their teens or 64s.  Women who are having their menstrual period.  People who are under a lot of emotional stress.  People who do not get enough iron or B vitamins.  People who have poor oral hygiene.  People who have an injury inside the mouth. This can happen after having dental work or from chewing something hard. SYMPTOMS Along with the canker sore, symptoms may also include:  Fever.  Fatigue.  Swollen lymph nodes in your neck. DIAGNOSIS This condition can be diagnosed based on your symptoms. Your health care provider will also examine your mouth. Your health care provider may also do tests if you get canker sores often or if they are very bad. Tests may include:  Blood tests to rule out other causes of canker sores.  Taking swabs from the sore to check for infection.  Taking a small piece of skin from the sore (biopsy) to test it for cancer. TREATMENT Most canker sores clear up without treatment in about 10 days. Home care is usually the only treatment that you will need. Over-the-counter medicines can relieve discomfort.If you have severe canker sores, your health care provider may prescribe:  Numbing ointment to relieve pain.  Vitamins.  Steroid medicines. These may be given as:  Oral pills.  Mouth rinses.  Gels.  Antibiotic mouth rinse. HOME  CARE INSTRUCTIONS  Apply, take, or use medicines only as directed by your health care provider. These include vitamins.  If you were prescribed an antibiotic mouth rinse, finish all of it even if you start to feel better.  Until the sores are healed:  Do not drink coffee or citrus juices.  Do not eat spicy or salty foods.  Use a mild, over-the-counter mouth rinse as directed by your health care provider.  Practice good oral hygiene.  Floss your teeth every day.  Brush your teeth with a soft brush twice each day. SEEK MEDICAL CARE IF:  Your symptoms do not get better after two weeks.  You also have a fever or swollen glands.  You get canker sores often.  You have a canker sore that is getting larger.  You cannot eat or drink due to your canker sores.   This information is not intended to replace advice given to you by your health care provider. Make sure you discuss any questions you have with your health care provider.   Document Released: 02/23/2011 Document Revised: 03/15/2015 Document Reviewed: 09/29/2014 Elsevier Interactive Patient Education 2016 [employer]  Sinus Rinse WHAT IS A SINUS RINSE? A  sinus rinse is a simple home treatment that is used to rinse your sinuses with a sterile mixture of salt and water (saline solution). Sinuses are air-filled spaces in your skull behind the bones of your face and forehead that open into your nasal cavity. You will use the following:  Saline solution.  Neti pot or spray bottle. This releases the saline solution into your nose and through your sinuses. Neti pots and spray bottles can be purchased at [employer], a health food store, or online. WHEN WOULD I DO A SINUS RINSE? A sinus rinse can help to clear mucus, dirt, dust, or pollen from the nasal cavity. You may do a sinus rinse when you have a cold, a virus, nasal allergy symptoms, a sinus infection, or stuffiness in the nose or sinuses. If you are considering a  sinus rinse:  Ask your child's health care provider before performing a sinus rinse on your child.  Do not do a sinus rinse if you have had ear or nasal surgery, ear infection, or blocked ears. HOW DO I DO A SINUS RINSE?  Wash your hands.  Disinfect your device according to the directions provided and then dry it.  Use the solution that comes with your device or one that is sold separately in stores. Follow the mixing directions on the package.  Fill your device with the amount of saline solution as directed by the device instructions.  Stand over a sink and tilt your head sideways over the sink.  Place the spout of the device in your upper nostril (the one closer to the ceiling).  Gently pour or squeeze the saline solution into the nasal cavity. The liquid should drain to the lower nostril if you are not overly congested.  Gently blow your nose. Blowing too hard may cause ear pain.  Repeat in the other nostril.  Clean and rinse your device with clean water and then air-dry it. ARE THERE RISKS OF A SINUS RINSE?  Sinus rinse is generally very safe and effective. However, there are a few risks, which include:   A burning sensation in the sinuses. This may happen if you do not make the saline solution as directed. Make sure to follow all directions when making the saline solution.  Infection from contaminated water. This is rare, but possible.  Nasal irritation.   This information is not intended to replace advice given to you by your health care provider. Make sure you discuss any questions you have with your health care provider.   Document Released: 05/26/2014 Document Reviewed: 05/26/2014 Elsevier Interactive Patient Education [employer].

## 2015-12-20 NOTE — ED Provider Notes (Signed)
CSN: [id number]     Arrival date & time 12/20/15  1248 History   First MD Initiated Contact with Patient 12/20/15 1319     Chief Complaint  Patient presents with  . Hoarse   (Consider location/radiation/quality/duration/timing/severity/associated sxs/prior Treatment) HPI Pt is a 55yo female presenting to [facility] with c/o worsening sore throat with hoarse voice and mild intermittent non-productive cough for 3 weeks.  She is also c/o sinus pressure with nasal congestion.  She reports hx of recurrent sinus infections despite having 3 sinus surgeries. Last surgery was over 15 years ago but she still gets sinus infections every several months. She notes she works from home and is on the phone for up to 12 hours a day.  Denies sick contacts, pointing out she works from home.  She has had subjective fever with hot and cold chills, dizziness and fatigue.  Denies n/v/d. She has used nasal saline, which typically helps but symptoms continue to worsen.  Past Medical History  Diagnosis Date  . Hypertriglyceridemia 04/21/2013  . 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 History of sinus surgery 04/21/2013  . History of mononucleosis 04/21/2013   Past Surgical History  Procedure Laterality Date  . Nasal sinus surgery    . Tonsillectomy    . Ovarian cyst surgery     Family History  Problem Relation Age of Onset  . Heart failure Mother   .      Social History  Substance Use Topics  . Smoking status: Current Some Day Smoker  . Smokeless tobacco: None  . Alcohol Use: 0.0 oz/week    0 Standard drinks or equivalent per week     Comment: social   OB History    No data available     Review of Systems  Constitutional: Positive for fever, chills and fatigue.  HENT: Positive for congestion, postnasal drip, rhinorrhea, sinus pressure, sneezing and sore throat. Negative for ear pain, trouble swallowing and voice change.   Respiratory: Positive for cough. Negative for shortness of breath.   Cardiovascular:  Negative for chest pain and palpitations.  Gastrointestinal: Negative for nausea, vomiting, abdominal pain and diarrhea.  Musculoskeletal: Negative for myalgias, back pain and arthralgias.  Skin: Negative for rash.  Neurological: Positive for dizziness and headaches. Negative for light-headedness.    Allergies  Benadryl; Caladryl; and Calamine  Home Medications   Prior to Admission medications   Medication Sig Start Date End Date Taking? Authorizing Provider  Cholecalciferol (VITAMIN D3) 5000 UNIT/ML LIQD Take 2 mLs by mouth daily.    Historical Provider, MD  Cyanocobalamin (VITAMIN B-12) 5000 MCG SUBL Place under the tongue daily.    Historical Provider, MD  doxycycline (VIBRAMYCIN) 100 MG capsule Take 1 capsule (100 mg total) by mouth 2 (two) times daily. One po bid x 7 days 12/20/15   Noland Fordyce, PA-C  mometasone (NASONEX) 50 MCG/ACT nasal spray Place 2 sprays into the nose daily. 08/25/15 08/24/16  Courtney Forcucci, PA-C  promethazine-dextromethorphan (PROMETHAZINE-DM) 6.25-15 MG/5ML syrup Take 5 mLs every 6-8 hours as needed for severe coughing. 08/25/15   Courtney Forcucci, PA-C   Meds Ordered and Administered this Visit  Medications - No data to display  BP 137/97 mmHg  Pulse 80  Temp(Src) 98.1 F (36.7 C) (Oral)  Resp 16  Ht 5\' 7"  (1.702 m)  Wt 272 lb (123.378 kg)  BMI 42.59 kg/m2  SpO2 97% No data found.   Physical Exam  Constitutional: She appears well-developed and well-nourished. No distress.  HENT:  Head: Normocephalic and atraumatic.  Right Ear: Hearing, tympanic membrane, external ear and ear canal normal.  Left Ear: Hearing, tympanic membrane, external ear and ear canal normal.  Nose: Mucosal edema and rhinorrhea present. Right sinus exhibits maxillary sinus tenderness. Right sinus exhibits no frontal sinus tenderness. Left sinus exhibits maxillary sinus tenderness and frontal sinus tenderness.  Mouth/Throat: Uvula is midline and mucous membranes are  normal. Oral lesions ( ulceration on Left tonsil) present. No trismus in the jaw. No dental abscesses or uvula swelling. Posterior oropharyngeal erythema present. No oropharyngeal exudate or posterior oropharyngeal edema.  Eyes: Conjunctivae are normal. No scleral icterus.  Neck: Normal range of motion. Neck supple.  Cardiovascular: Normal rate, regular rhythm and normal heart sounds.   Pulmonary/Chest: Effort normal and breath sounds normal. No stridor. No respiratory distress. She has no wheezes. She has no rales. She exhibits no tenderness.  Abdominal: Soft. She exhibits no distension. There is no tenderness.  Musculoskeletal: Normal range of motion.  Lymphadenopathy:    She has cervical adenopathy.  Neurological: She is alert.  Skin: Skin is warm and dry. She is not diaphoretic.  Nursing note and vitals reviewed.   ED Course  Procedures (including critical care time)  Labs Review Labs Reviewed - No data to display  Imaging Review No results found.    MDM   1. Laryngitis   2. Acute recurrent maxillary sinusitis   3. Canker sores oral    Pt c/o worsening nasal congestion with sore throat and hoarse voice. Canker sore noted on Left tonsil w/o evidence of peritonsillar abscess.  Hx of recurrent sinusitis. Discussed use of prednisone, pt declined stating it makes her anxious and causes her to gain weight.  Rx: doxycycline Pt has promethazine-DM cough medication. Advised she may take that as prescribed for her cough.  Pt would like to work but understands her voice needs rest.  Work note provided. Pt to be off tomorrow, 12/21/15 and half days on Thursday and Friday. Advised pt to use acetaminophen and ibuprofen as needed for fever and pain. Encouraged rest and fluids. F/u with PCP in 7-10 days if not improving, sooner if worsening. Pt verbalized understanding and agreement with tx plan.     Noland Fordyce, PA-C 12/20/15 1420

## 2015-12-20 NOTE — ED Notes (Signed)
Pt c/o hoarseness and nonproductive cough x 3 days. Denies fever.

## 2016-02-09 HISTORY — PX: NM MYOVIEW LTD: HXRAD82

## 2016-02-10 ENCOUNTER — Other Ambulatory Visit: Payer: Self-pay | Admitting: *Deleted

## 2016-02-10 DIAGNOSIS — R079 Chest pain, unspecified: Secondary | ICD-10-CM

## 2016-02-10 DIAGNOSIS — Z8249 Family history of ischemic heart disease and other diseases of the circulatory system: Secondary | ICD-10-CM

## 2016-03-01 ENCOUNTER — Ambulatory Visit (INDEPENDENT_AMBULATORY_CARE_PROVIDER_SITE_OTHER): Payer: BLUE CROSS/BLUE SHIELD | Admitting: Cardiology

## 2016-03-01 ENCOUNTER — Encounter: Payer: Self-pay | Admitting: Cardiology

## 2016-03-01 VITALS — BP 144/86 | HR 70 | Ht 67.0 in | Wt 274.0 lb

## 2016-03-01 DIAGNOSIS — IMO0001 Reserved for inherently not codable concepts without codable children: Secondary | ICD-10-CM

## 2016-03-01 DIAGNOSIS — R030 Elevated blood-pressure reading, without diagnosis of hypertension: Secondary | ICD-10-CM | POA: Diagnosis not present

## 2016-03-01 DIAGNOSIS — E782 Mixed hyperlipidemia: Secondary | ICD-10-CM | POA: Diagnosis not present

## 2016-03-01 DIAGNOSIS — M940 Chondrocostal junction syndrome [Tietze]: Secondary | ICD-10-CM

## 2016-03-01 NOTE — Progress Notes (Signed)
PATIENT: Morgan Maddox MRN: [id number] DOB: 11-Feb-1961 PCP: Alesia Richards, MD  Clinic Note: Chief Complaint  Patient presents with  . New Evaluation  . Dizziness  . Shortness of Breath  . Edema    HPI: Morgan Maddox is a 55 y.o. female with a PMH below who presents today for Cardiology Hospital f/u for CP. She is the sister of one of my patients (who is with her today) & their mother is also my patient. Morgan Maddox is a morbidly obese woman (BMI of roughly 12), with a long history of tobacco abuse, and family history of CAD.  Hospital Records Reviewed from [facility]): She presented there on March 29 with symptoms of intermittent substernal chest pain radiating to the right shoulder and right jaw. She contacted her sister who herself is one of my patients. Her sister was very concerned about the chest discomfort radiating to the jaw and recommended that she quickly go see help - especially in light of her family history of CAD & her own CRFs. She went to the closest facility which is [place].    She noted that the episodes would occur at rest but also did occur with activity. She works at home sitting at [employer], but is under a significant amount of stress with work. This keeps her from being able to be active and doing any exercise. She has had a hard time with her weight. She states that work has caused her significant amount of social stress. She noted that when she had these episodes she also had significant swelling in her hands and feet. That has subsequently improved. When this pain in her chest occurred, she became more worrisome about the symptoms as the spells lasted longer and were associated with swelling in her hands and feet. She is admitted overnight and ruled out for MI. Normal chest x-ray. She had a Myoview stress test that basically suggested potential breast attenuation. Cardiac catheterization was not recommended as the symptoms were not  thought to be cardiac in nature.  Myoview 02/09/2016: FINDINGS: No comparison. LOW RISK. Decreased activity is present within the periapical anterior wall which demonstrates partial improvement on rest images. Normal wall motion is seen at this site. While this may partially be secondary to breast attenuation, the images suggest a small component of ischemia at the periapical anterior wall. No additional perfusion abnormality. No regional wall motion abnormality. Left ventricular ejection fraction is calculated to be 76%.  Lipid Panel: cholesterol is 155 with triglycerides of 113, HDL 63 and LDL 69. Hemoglobin A1c 5.6  Interval History: [name] now presents for hospital f/u with me (per request of her sister & mother).  She is extremely emotional and in several occasions broke out in tears during this visit. She does not do well with Gerhard Perches interactions. She states that she still having some episodes of discomfort. And she is able to point to with one finger along the costal margin on the right side of her chest. It radiates some out to the right side of her chest. No further radiation to her jaw. The swelling in her hands and feet has notably improved. Now 1 symptoms noting more concern for her is that she's had a lot of pain in the triceps area of her right arm and shoulder that is worse with any particular movement. Any attempt to use the right arm is very painful. She is essentially sedentary and does not do any activity --> before her work got very  dizzy, she would try to go walk routinely up to 2 miles at a time, but has not done that recently. Her chest discomfort is not made worse with activity occurs on most always at rest. She had some palpitations during her initial presentation, but has not necessarily had further episodes of palpitations.   The remainder of cardiac review of systems is as follows: Cardiovascular ROS: positive for - Right sided chest pain, intermittent leg and hand  swelling negative for - dyspnea on exertion, irregular heartbeat, loss of consciousness, murmur, orthopnea, palpitations, paroxysmal nocturnal dyspnea, rapid heart rate, shortness of breath or Syncope/near syncope and TIA/amaurosis fugax, claudication :   Past Medical History  Diagnosis Date  . Hypertriglyceridemia 04/21/2013  . 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 History of sinus surgery 04/21/2013  . History of mononucleosis 04/21/2013  . Bilateral ovarian cysts   . Morbid obesity with BMI of 40.0-44.9, adult ([place])     BMI roughly 43 noted on 02/09/2016 admission [name])  . Family history of premature CAD     Brother had an MI, sister has CAD. Mother had an MI at age 51. Still alive at 4.  . Vitamin D deficiency     Prior Cardiac Evaluation and Past Surgical History: Past Surgical History  Procedure Laterality Date  . Nasal sinus surgery    . Tonsillectomy    . Ovarian cyst surgery    . Nm myoview ltd  02/09/2016    [place]: EF 76%. Periapical defect with partial improvement during rest. No wall motion abnormality noted. Likely related to breast attenuation, cannot exclude small area of periapical ischemia. --> Cardiac catheterization not recommended. LOW RISK    Allergies  Allergen Reactions  . Benadryl [Diphenhydramine Hcl]   . Caladryl [Pramoxine-Calamine]   . Calamine     Current Outpatient Prescriptions  Medication Sig Dispense Refill  . aspirin 81 MG chewable tablet Chew 81 mg by mouth daily.    . Cholecalciferol (VITAMIN D3) 5000 UNIT/ML LIQD Take 2 mLs by mouth daily.    . Cyanocobalamin (VITAMIN B-12) 5000 MCG SUBL Place under the tongue daily.     No current facility-administered medications for this visit.   Social History   Social History  . Marital Status: Single    Spouse Name: N/A  . Number of Children: 0  . Years of Education: N/A   Social History Main Topics  . Smoking status: Heavy Tobacco Smoker -- 0.50 packs/day for 25 years     Types: Cigarettes  . Smokeless tobacco: None  . Alcohol Use: 0.0 oz/week    0 Standard drinks or equivalent per week     Comment: social  . Drug Use: No  . Sexual Activity: Not Currently   Other Topics Concern  . None   Social History Narrative   She works from home. Works 10-12 hour shifts. Significant social stress.   Lives alone. Spent most of her time alone. Does not usually interact personally with other people besides her sister    Family History family history includes Coronary artery disease in her brother and mother; Coronary artery disease (age of onset: 21) in her sister; Heart attack in her brother and mother; Heart failure in her mother.  ROS: A comprehensive Review of Systems - was perfomed Review of Systems  Constitutional: Negative for weight loss (Roughly 50 pound weight gain over the past few years.).  HENT: Negative for congestion and nosebleeds.   Respiratory: Positive for shortness of breath (Difficulty breathing when  she has these symptoms and stress.). Negative for wheezing.   Cardiovascular: Positive for chest pain (Mostly right-sided) and leg swelling (And hand).  Gastrointestinal: Negative for heartburn, abdominal pain, constipation, blood in stool and melena.  Genitourinary: Negative for hematuria.  Musculoskeletal: Positive for myalgias (Right arm/tricep/shoulder) and joint pain (Right shoulder). Negative for falls.  Skin: Negative.   Neurological: Negative.  Negative for dizziness and headaches.  Endo/Heme/Allergies: Does not bruise/bleed easily.  Psychiatric/Behavioral: Positive for depression. The patient is nervous/anxious (Extremely anxious with multiple social stressors. Lots of stress from work).   All other systems reviewed and are negative.   PHYSICAL EXAM BP 144/86 mmHg  Pulse 70  Ht 5\' 7"  (1.702 m)  Wt 274 lb (124.286 kg)  BMI 42.90 kg/m2 General appearance: alert, cooperative, appears stated age, moderately obese and Well-nourished  well-groomed. Very anxious and tearful Neck: no adenopathy, no carotid bruit, no JVD, supple, symmetrical, trachea midline and thyroid not enlarged, symmetric, no tenderness/mass/nodules Lungs: clear to auscultation bilaterally, normal percussion bilaterally and Nonlabored, good air movement Heart: regular rate and rhythm, S1, S2 normal, no murmur, click, rub or gallop, normal apical impulse and Cannot exclude midsystolic click Abdomen: soft, non-tender; bowel sounds normal; no masses,  no organomegaly and Extreme obesity Extremities: extremities normal, atraumatic, no cyanosis or edema and no ulcers, gangrene or trophic changes Pulses: 2+ and symmetric Skin: Skin color, texture, turgor normal. No rashes or lesions Neurologic: Mental status: Alert, oriented, thought content appropriate, affect: labile and Very tearful at any conversation or suggestion of concerning issues. Cranial nerves: normal Motor: grossly normal She does have a lot of pain along the right tricep area leading to the shoulder. This is very sore with any particular range of motion activity. Gait: Normal  palpation along her chest wall reveals focal point tenderness with direct reproduction of her symptom along the third and fourth rib at the sternal margin consistent with costochondritis.    Adult ECG Report  Rate: 70 ;  Rhythm: normal sinus rhythm  QRS Axis: 17 ;  PR Interval: 140 ;  QRS Duration: 76 ; QTc: 440;  Voltages: Normal (borderline low, likely related to body habitus) Conduction Disturbances: none ;  Other Abnormalities: none   Narrative Interpretation: Normal EKG  Labs reviewed above  ASSESSMENT / PLAN: Zenia definitely has a lot of cardiac risk factors with family history, smoking and obesity, however her lipids are relatively controlled. She doesn't have diabetes or hypertension.   she presented to the hospital with chest pain that ruled out for MI. She had a Myoview that by the cardiologist read there  suggested breast attenuation and low risk. I agree that the nature of her symptoms are somewhat less concerning based on the right sided focal reproducible pain that I fill exam in association with the right arm pain. This is very unlikely cardiac in nature. I'm more concerned with costochondritis.  We will treat empirically for costochondritis and then reassess. I tried to reassure her that I don't think this is cardiac in nature and that we don't need to do any further evaluation. However if symptoms were to persist or worsen, we may need to consider whether or not we proceed with cardiac catheterization. This would not be my first choice however.  Plan: Take over the counter Ibuprofen 800 mg three times a day for 3 days, 600 mg three times a day for 3 days, 400 mg three times a day for 2 days 200 mg three times a day for 2  days    Problem List Items Addressed This Visit    Morbid obesity (BMI 43.44)     Once we are certain that her symptoms are noncardiac in nature, we can talk about getting into an exercise program. She should go to get back into her walking routine. Also need to consider nutrition counseling.      Mixed hyperlipidemia    Lipid Panel from her hospital stay: cholesterol is 155 with triglycerides of 113, HDL 63 and LDL 69. Hemoglobin A1c 5.6  This would be at goal.      Relevant Medications   aspirin 81 MG chewable tablet   Elevated BP    She is very anxious and stressed out today. I'm not inclined to follow this one recording. We can see what her pressure is running when I see her in follow-up.      Costochondritis - Primary    Produce full right-sided rib pain. Relatively low risk stress test.  Plan will be NSAID taper as noted. Reassess in one month         Meds ordered this encounter  Medications  . aspirin 81 MG chewable tablet    Sig: Chew 81 mg by mouth daily.    Extensive review of the chart using Care Everywhere  was required.  A great deal of the  conversation was revolving around her social stress. A lot of information was being relayed by her sister who had to continuously calm her down. The nature of the end interaction led to it lasting longer than one would normally expect. Her necessary time was required to reassure the patient. I spent directly with the patient of close to 60 minutes in addition to 15-20 minutes of chart review.   Counseling More Than 50% of the Appointment Time.      Followup: 1 months    Johathon Overturf, Leonie Green, M.D., M.S. Interventional Cardiologist   Pager # [phone] Phone # [phone] [address] [place], [place] [address]

## 2016-03-01 NOTE — Patient Instructions (Signed)
Your physician recommends that you schedule a follow-up appointment in: [place] has recommended you make the following change in your medication: Take over the counter Ibuprofen 800 mg three times a day for 3 days, 600 mg three times a day for 3 days, 400 mg three times a day for 2 days 200 mg three times a day for 2 days

## 2016-03-04 ENCOUNTER — Encounter: Payer: Self-pay | Admitting: Cardiology

## 2016-03-04 DIAGNOSIS — M940 Chondrocostal junction syndrome [Tietze]: Secondary | ICD-10-CM | POA: Insufficient documentation

## 2016-03-04 NOTE — Assessment & Plan Note (Signed)
She is very anxious and stressed out today. I'm not inclined to follow this one recording. We can see what her pressure is running when I see her in follow-up.

## 2016-03-04 NOTE — Assessment & Plan Note (Signed)
Lipid Panel from her hospital stay: cholesterol is 155 with triglycerides of 113, HDL 63 and LDL 69. Hemoglobin A1c 5.6  This would be at goal.

## 2016-03-04 NOTE — Assessment & Plan Note (Signed)
Once we are certain that her symptoms are noncardiac in nature, we can talk about getting into an exercise program. She should go to get back into her walking routine. Also need to consider nutrition counseling.

## 2016-03-04 NOTE — Assessment & Plan Note (Signed)
Produce full right-sided rib pain. Relatively low risk stress test.  Plan will be NSAID taper as noted. Reassess in one month

## 2016-03-05 ENCOUNTER — Other Ambulatory Visit: Payer: Self-pay | Admitting: Internal Medicine

## 2016-03-20 ENCOUNTER — Other Ambulatory Visit: Payer: Self-pay

## 2016-03-20 DIAGNOSIS — Z1231 Encounter for screening mammogram for malignant neoplasm of breast: Secondary | ICD-10-CM

## 2016-04-02 ENCOUNTER — Ambulatory Visit (INDEPENDENT_AMBULATORY_CARE_PROVIDER_SITE_OTHER): Payer: BLUE CROSS/BLUE SHIELD | Admitting: Internal Medicine

## 2016-04-02 ENCOUNTER — Encounter: Payer: Self-pay | Admitting: Internal Medicine

## 2016-04-02 VITALS — BP 140/100 | HR 107 | Temp 97.6°F | Resp 16 | Ht 69.0 in | Wt 273.4 lb

## 2016-04-02 DIAGNOSIS — I10 Essential (primary) hypertension: Secondary | ICD-10-CM

## 2016-04-02 DIAGNOSIS — R079 Chest pain, unspecified: Secondary | ICD-10-CM

## 2016-04-02 MED ORDER — SERTRALINE HCL 50 MG PO TABS: ORAL_TABLET | ORAL | Status: DC

## 2016-04-02 NOTE — Progress Notes (Signed)
  Subjective:    Patient ID: Morgan Maddox, female    DOB: [birth year], 55 y.o.   MRN: [id number]  HPI  This very nice 55 yo DWF with Labile HTN had an ER evaluation for CP on 29 Mar at [facility] & on 30 Mar had a Stress Myoview which was negative. Lipid profile was normal as was A1c and patient was recently seen by Dr Ellyn Hack for 2sd opinion and was dx'd with Costochondritis. Patient has hx/o chronic and acute anxiety and depression and was brought today by her sister [name].  Patient endorses her reluctance to take medications for depression as a sign of weakness.   Medication Sig  . aspirin 81 MG Take 1 tab daily.  [name] VITAMIN D 5000 UNIT/ML LIQD Take 2 mLs (10,000 u) daily.  [name] VITAMIN B-12 5,000 MCG SUBL Place under the tongue daily.   Allergies  Allergen Reactions  . Benadryl [Diphenhydramine Hcl]   . Caladryl [Pramoxine-Calamine]   . Calamine    Past Medical History  Diagnosis Date  . Hypertriglyceridemia 04/21/2013  . 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 History of sinus surgery 04/21/2013  . History of mononucleosis 04/21/2013  . Bilateral ovarian cysts   . Morbid obesity with BMI of 40.0-44.9, adult ([place])     BMI roughly 43 noted on 02/09/2016 admission [name])  . Family history of premature CAD     Brother had an MI, sister has CAD. Mother had an MI at age 71. Still alive at 16.  . Vitamin D deficiency    Review of Systems  10 point systems review negative except as above.    Objective:   Physical Exam  BP 140/100   Pulse 107  Temp 97.6 F  Resp 16  Ht 5\' 9"    Wt 273 lb     BMI 40.36    SpO2 96%  Patient is extremely anxious and tearful today and denies any SI or hallucinations  No formal exam was done with patient interviewed at length and counseled approx 20 minutes regarding need for medication to which she reluctantly agreed to on a trial basis.    Assessment & Plan:   1. Labile Hypertension  - anticipate BP will normalize one anxiety is  controlled.  - sertraline (ZOLOFT) 50 MG tablet; Take 1/2 to 1 tablet daily as directed for mood - Dispense: 30 tablet; Refill: 2  - advised to start Zoloft 25 mg qd x 2 week , then increase to 5o mg qd and ROV in 1 month to reassess  2. Chest pain, non-cardiac

## 2016-04-02 NOTE — Patient Instructions (Signed)
Start Zoloft (Sertraline ) 50 mg at   1/2 tablet at bedtime for the 1st 2 weeks ,    Then increase to 1 whole tablet daily  +++++++++++++++++++++++++++++++++++++++++++++++++++++ Generalized Anxiety Disorder Generalized anxiety disorder (GAD) is a mental disorder. It interferes with life functions, including relationships, work, and school. GAD is different from normal anxiety, which everyone experiences at some point in their lives in response to specific life events and activities. Normal anxiety actually helps [place] prepare for and get through these life events and activities. Normal anxiety goes away after the event or activity is over.  GAD causes anxiety that is not necessarily related to specific events or activities. It also causes excess anxiety in proportion to specific events or activities. The anxiety associated with GAD is also difficult to control. GAD can vary from mild to severe. People with severe GAD can have intense waves of anxiety with physical symptoms (panic attacks).  SYMPTOMS The anxiety and worry associated with GAD are difficult to control. This anxiety and worry are related to many life events and activities and also occur more days than not for 6 months or longer. People with GAD also have three or more of the following symptoms (one or more in children):  Restlessness.   Fatigue.  Difficulty concentrating.   Irritability.  Muscle tension.  Difficulty sleeping or unsatisfying sleep. DIAGNOSIS GAD is diagnosed through an assessment by your health care provider. Your health care provider will ask you questions aboutyour mood,physical symptoms, and events in your life. Your health care provider may ask you about your medical history and use of alcohol or drugs, including prescription medicines. Your health care provider may also do a physical exam and blood tests. Certain medical conditions and the use of certain substances can cause symptoms similar to those  associated with GAD. Your health care provider may refer you to a mental health specialist for further evaluation. TREATMENT The following therapies are usually used to treat GAD:   Medication. Antidepressant medication usually is prescribed for long-term daily control. Antianxiety medicines may be added in severe cases, especially when panic attacks occur.   Talk therapy (psychotherapy). Certain types of talk therapy can be helpful in treating GAD by providing support, education, and guidance. A form of talk therapy called cognitive behavioral therapy can teach you healthy ways to think about and react to daily life events and activities.  Stress managementtechniques. These include yoga, meditation, and exercise and can be very helpful when they are practiced regularly. A mental health specialist can help determine which treatment is best for you. Some people see improvement with one therapy. However, other people require a combination of therapies.   +++++++++++++++++++++++++++++++++++++++++++++++++++++++++++++ Depressive Disorder     Depressive disorder usually causes feelings of sadness, hopelessness, or helplessness. Some people with this disorder do not feel particularly sad but lose interest in doing things they used to enjoy (anhedonia). Major depressive disorder also can cause physical symptoms. It can interfere with work, school, relationships, and other normal everyday activities. The disorder varies in severity but is longer lasting and more serious than the sadness we all feel from time to time in our lives.     Depressive disorder often is triggered by stressful life events or major life changes. Examples of these triggers include divorce, loss of your job or home, a move, and the death of a family member or close friend. Sometimes this disorder occurs for no obvious reason at all. People who have family members with major  depressive disorder or bipolar disorder are at higher risk for  developing this disorder, with or without life stressors. Depressive disorder can occur at any age. It may occur just once in your life (single episode major depressive disorder). It may occur multiple times (recurrent major depressive disorder).  SYMPTOMS People with Depressive disorder have either anhedonia or depressed mood on nearly a daily basis for at least 2 weeks or longer. Symptoms of depressed mood include:  Feelings of sadness (blue or down in the dumps) or emptiness.  Feelings of hopelessness or helplessness.  Tearfulness or episodes of crying (may be observed by others).  Irritability (children and adolescents). In addition to depressed mood or anhedonia or both, people with this disorder have at least four of the following symptoms:  Difficulty sleeping or sleeping too much.   Significant change (increase or decrease) in appetite or weight.   Lack of energy or motivation.  Feelings of guilt and worthlessness.   Difficulty concentrating, remembering, or making decisions.  Unusually slow movement (psychomotor retardation) or restlessness (as observed by others).   Recurrent wishes for death, recurrent thoughts of self-harm (suicide), or a suicide attempt. People with major depressive disorder commonly have persistent negative thoughts about themselves, other people, and the world. People with severe major depressive disorder may experiencedistorted beliefs or perceptions about the world (psychotic delusions). They also may see or hear things that are not real (psychotic hallucinations).  DIAGNOSIS    Depressive disorder is diagnosed through an assessment by your health care provider. Your health care provider will ask aboutaspects of your daily life, such as mood,sleep, and appetite, to see if you have the diagnostic symptoms of major depressive disorder. Your health care provider may ask about your medical history and use of alcohol or drugs, including prescription  medicines. Your health care provider also may do a physical exam and blood work. This is because certain medical conditions and the use of certain substances can cause major depressive disorder-like symptoms (secondary depression). Your health care provider also may refer you to a mental health specialist for further evaluation and treatment.  TREATMENT   It is important to recognize the symptoms of major depressive disorder and seek treatment. The following treatments can be prescribed for this disorder:   Medicine. Antidepressant medicines usually are prescribed. Antidepressant medicines are thought to correct chemical imbalances in the brain that are commonly associated with major depressive disorder. Other types of medicine may be added if the symptoms do not respond to antidepressant medicines alone or if psychotic delusions or hallucinations occur.  Talk therapy. Talk therapy can be helpful in treating major depressive disorder by providing support, education, and guidance. Certain types of talk therapy also can help with negative thinking (cognitive behavioral therapy) and with relationship issues that trigger this disorder (interpersonal therapy). A mental health specialist can help determine which treatment is best for you. Most people with major depressive disorder do well with a combination of medicine and talk therapy. Treatments involving electrical stimulation of the brain can be used in situations with extremely severe symptoms or when medicine and talk therapy do not work over time. These treatments include electroconvulsive therapy, transcranial magnetic stimulation, and vagal nerve stimulation.

## 2016-04-10 ENCOUNTER — Ambulatory Visit (INDEPENDENT_AMBULATORY_CARE_PROVIDER_SITE_OTHER): Payer: BLUE CROSS/BLUE SHIELD | Admitting: Cardiology

## 2016-04-10 ENCOUNTER — Encounter: Payer: Self-pay | Admitting: Cardiology

## 2016-04-10 ENCOUNTER — Ambulatory Visit
Admission: RE | Admit: 2016-04-10 | Discharge: 2016-04-10 | Disposition: A | Discharge status: Home / Self Care | Payer: BLUE CROSS/BLUE SHIELD | Source: Ambulatory Visit

## 2016-04-10 VITALS — BP 140/70 | HR 68 | Ht 67.0 in | Wt 270.8 lb

## 2016-04-10 DIAGNOSIS — M940 Chondrocostal junction syndrome [Tietze]: Secondary | ICD-10-CM

## 2016-04-10 DIAGNOSIS — R030 Elevated blood-pressure reading, without diagnosis of hypertension: Secondary | ICD-10-CM | POA: Diagnosis not present

## 2016-04-10 DIAGNOSIS — E782 Mixed hyperlipidemia: Secondary | ICD-10-CM

## 2016-04-10 DIAGNOSIS — Z1231 Encounter for screening mammogram for malignant neoplasm of breast: Secondary | ICD-10-CM

## 2016-04-10 DIAGNOSIS — IMO0001 Reserved for inherently not codable concepts without codable children: Secondary | ICD-10-CM

## 2016-04-10 NOTE — Progress Notes (Signed)
PCP: Alesia Richards, MD  Clinic Note: Chief Complaint  Patient presents with  . 1 MONTH FOLLOW UP    NO CHEST PAIN, NO SWELLING , NO SOB    HPI: Morgan Maddox is a 55 y.o. female with a PMH below who presents today for one-month follow-up chest pain. She is a sister Morgan Maddox) and daughter Morgan Maddox) of 2 patients are follow-up with me.  Morgan Maddox is a morbidly obese woman (BMI of roughly 36), with a long history of tobacco abuse, and family history of CAD  Morgan Maddox was last seen on 03/01/2016 in follow-up from a [facility] admission for substernal chest pain and dyspnea.  She had a Myoview stress test performed that was read as "LOW RISK "there appear to be artifact in the apical wall suggestive of breast attenuation. EF was 76%. (I explained this process and the artifact findings).  When I saw her, she mostly noted pain in the triceps area of her right arm. She was diagnosed with likely costochondritis and treated with high-dose NSAIDs.  Recent Hospitalizations: none - went to Orthopedics -steroid injection in shoulder, helped some - started on Zoloft -- doing better, sleeping better.  Studies Reviewed:   n/a  Interval History: The patient presents today in much better spirits than she was last time. She has now about one week into taking Zoloft even at the lower dose. She has been sleeping better. She is much less stressed. She is not as tearful and emotional. She hasn't had any more of the chest discomfort that she had taken to the emergency room. She only took ibuprofen for a day or so. The main and is bothering her now is a right shoulder. She did get an injection at the orthopedic clinic which did help some.  At present, she really has not had any further episodes of chest discomfort with rest or exertion. She does get dyspnea with exertion, but she attributes this more to being out of shape. Remainder of her cardiovascular  review of symptoms is as  follows: No chest pain or shortness of breath with rest or exertion.  No PND, orthopnea or edema.  No palpitations, lightheadedness, dizziness, weakness or syncope/near syncope. No TIA/amaurosis fugax symptoms.  She also indicates that she has significantly cut down amount of cigarettes she smokes.  ROS: A comprehensive was performed. Review of Systems  Constitutional: Negative for malaise/fatigue.  HENT: Positive for nosebleeds.   Eyes: Negative for blurred vision.  Respiratory: Negative for cough, shortness of breath and wheezing.   Cardiovascular: Positive for leg swelling (But much better over the last 2 weeks). Negative for claudication.  Gastrointestinal: Negative for blood in stool and melena.  Genitourinary: Negative for hematuria.  Musculoskeletal: Positive for joint pain (Right shoulder).  Neurological: Negative for dizziness, tingling and headaches.  Endo/Heme/Allergies: Does not bruise/bleed easily.  Psychiatric/Behavioral: Positive for depression. The patient is nervous/anxious.        Overall symptoms are notably improved  All other systems reviewed and are negative.    Past Medical History  Diagnosis Date  . Hypertriglyceridemia 04/21/2013  . 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 History of sinus surgery 04/21/2013  . History of mononucleosis 04/21/2013  . Bilateral ovarian cysts   . Morbid obesity with BMI of 40.0-44.9, adult ([place])     BMI roughly 43 noted on 02/09/2016 admission [name])  . Family history of premature CAD     Brother had an MI, sister has CAD. Mother had an MI  at age 63. Still alive at 19.  . Vitamin D deficiency     Past Surgical History  Procedure Laterality Date  . Nasal sinus surgery    . Tonsillectomy    . Ovarian cyst surgery    . Nm myoview ltd  02/09/2016    [place]: EF 76%. Periapical defect with partial improvement during rest. No wall motion abnormality noted. Likely related to breast attenuation, cannot exclude small  area of periapical ischemia. --> Cardiac catheterization not recommended. LOW RISK    Prior to Admission medications   Medication Sig Start Date End Date Taking? Authorizing Provider  aspirin 81 MG chewable tablet Chew 81 mg by mouth daily.   Yes Historical Provider, MD  Cholecalciferol (VITAMIN D3) 5000 UNIT/ML LIQD Take 2 mLs by mouth daily.   Yes Historical Provider, MD  Cyanocobalamin (VITAMIN B-12) 5000 MCG SUBL Place under the tongue daily.   Yes Historical Provider, MD  sertraline (ZOLOFT) 50 MG tablet Take 1/2 to 1 tablet daily as directed for mood 04/02/16 07/03/16 Yes Unk Pinto, MD    Allergies  Allergen Reactions  . Benadryl [Diphenhydramine Hcl]   . Caladryl [Pramoxine-Calamine]   . Calamine     Social History   Social History  . Marital Status: Single    Spouse Name: N/A  . Number of Children: 0  . Years of Education: N/A   Social History Main Topics  . Smoking status: Heavy Tobacco Smoker -- 0.50 packs/day for 25 years    Types: Cigarettes  . Smokeless tobacco: None  . Alcohol Use: 0.0 oz/week    0 Standard drinks or equivalent per week     Comment: social  . Drug Use: No  . Sexual Activity: Not Currently   Other Topics Concern  . None   Social History Narrative   She works from home. Works 10-12 hour shifts. Significant social stress.   Lives alone. Spent most of her time alone. Does not usually interact personally with other people besides her sister    family history includes Coronary artery disease in her brother and mother; Coronary artery disease (age of onset: 76) in her sister; Heart attack in her brother and mother; Heart failure in her mother.   Wt Readings from Last 3 Encounters:  04/10/16 270 lb 12.8 oz (122.834 kg)  04/02/16 273 lb 6.4 oz (124.013 kg)  03/01/16 274 lb (124.286 kg)    PHYSICAL EXAM BP 140/70 mmHg  Pulse 68  Ht 5\' 7"  (1.702 m)  Wt 270 lb 12.8 oz (122.834 kg)  BMI 42.40 kg/m2 General appearance: alert,  cooperative, appears stated age, moderately obese and Well-nourished well-groomed. Noticeably less anxious or tearful today. Has a smile on her face.  HEENT: [place]/AT, EOMI, MMM, anicteric sclera Lungs: clear to auscultation bilaterally, normal percussion bilaterally and Nonlabored, good air movement Heart: regular rate and rhythm, S1, S2 normal, no murmur, click, rub or gallop, normal apical impulse  Abdomen: soft, non-tender; bowel sounds normal; no masses, no organomegaly and Extreme obesity Extremities: extremities normal, atraumatic, no cyanosis or edema and no ulcers, gangrene or trophic changes Pulses: 2+ and symmetric Skin: Skin color, texture, turgor normal. No rashes or lesions Neurologic: A&O x 3.  Normal mood & affect   Adult ECG Report none   Other studies Reviewed: Additional studies/ records that were reviewed today include:  Recent Labs:  n/a    ASSESSMENT / PLAN: Problem List Items Addressed This Visit    Morbid obesity (BMI 43.44)  (Chronic)  Now she is feeling a little bit better, she is artery lost little weight. Gradually her on this effort and recommended that she continue to watch her diet and taken for level activity can do to work on weight loss.      Mixed hyperlipidemia   Elevated BP - Primary   Costochondritis    Only minimal reproducible pain currently. Continue to consider when necessary ibuprofen. Would be cautious of sleeping just on one side versus alternating. Weight loss will also help         Current medicines are reviewed at length with the patient today. (+/- concerns) none The following changes have been made: none Studies Ordered:   No orders of the defined types were placed in this encounter.  Follow-up as needed     Glenetta Hew, M.D., M.S. Interventional Cardiologist   Pager # [phone] Phone # [phone] [address] [place], [place] [address]

## 2016-04-10 NOTE — Assessment & Plan Note (Signed)
Only minimal reproducible pain currently. Continue to consider when necessary ibuprofen. Would be cautious of sleeping just on one side versus alternating. Weight loss will also help

## 2016-04-10 NOTE — Assessment & Plan Note (Signed)
Now she is feeling a little bit better, she is artery lost little weight. Gradually her on this effort and recommended that she continue to watch her diet and taken for level activity can do to work on weight loss.

## 2016-04-10 NOTE — Patient Instructions (Addendum)
  NO CHANGE IN MEDICATIONS   Your physician wants you to follow-up on an as needed basis

## 2016-04-30 ENCOUNTER — Ambulatory Visit (INDEPENDENT_AMBULATORY_CARE_PROVIDER_SITE_OTHER): Payer: BLUE CROSS/BLUE SHIELD | Admitting: Internal Medicine

## 2016-04-30 ENCOUNTER — Encounter: Payer: Self-pay | Admitting: Internal Medicine

## 2016-04-30 VITALS — BP 130/84 | HR 88 | Temp 97.3°F | Resp 16 | Ht 66.5 in | Wt 273.4 lb

## 2016-04-30 DIAGNOSIS — IMO0001 Reserved for inherently not codable concepts without codable children: Secondary | ICD-10-CM

## 2016-04-30 DIAGNOSIS — R7303 Prediabetes: Secondary | ICD-10-CM

## 2016-04-30 DIAGNOSIS — R079 Chest pain, unspecified: Secondary | ICD-10-CM

## 2016-04-30 DIAGNOSIS — E559 Vitamin D deficiency, unspecified: Secondary | ICD-10-CM

## 2016-04-30 DIAGNOSIS — R030 Elevated blood-pressure reading, without diagnosis of hypertension: Secondary | ICD-10-CM | POA: Diagnosis not present

## 2016-04-30 DIAGNOSIS — I10 Essential (primary) hypertension: Secondary | ICD-10-CM | POA: Diagnosis not present

## 2016-04-30 MED ORDER — SERTRALINE HCL 50 MG PO TABS: ORAL_TABLET | ORAL | Status: DC

## 2016-04-30 NOTE — Patient Instructions (Signed)

## 2016-04-30 NOTE — Progress Notes (Signed)
[facility]   Unk Pinto, M.D.    Uvaldo Bristle. Silverio Lay, P.A.-C      Starlyn Skeans, P.A.-C   [facility]                [address] [place], [place] [id number] Telephone [phone] Telefax [phone] _________________________________   Subjective:    Patient ID: Morgan Maddox, female    DOB: 01-10-1961, 55 y.o.   MRN: [id number]  HPI  Patient presents for f/u with hx/o labile elevated BP and admits she feels better since initiating low dose Sertraline 1 month ago and has been in beter spirits and less emotionally labile. Patient has been seen again by Dr Ellyn Hack recounting that she does not have Cardiac Disease. Patient is still not exercising and weight is stable. (weight at [facility] graduation was 145 and now is 273 !). A1c was 5.9%. 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  Medication Sig  . aspirin 81 MG Chew 81 mg by mouth daily.  [name] VITAMIN D 5000 UNIT/ML LIQD Take 2 mLs by mouth daily.  [name] VITAMIN B-12 5000 MCG SUBL Place under the tongue daily.  . sertraline  50 MG tablet Take  1 tablet daily as directed for mood   Allergies  Allergen Reactions  . Benadryl [Diphenhydramine Hcl]   . Caladryl [Pramoxine-Calamine]   . Calamine    Past Medical History  Diagnosis Date  . Hypertriglyceridemia 04/21/2013  . 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 History of sinus surgery 04/21/2013  . History of mononucleosis 04/21/2013  . Bilateral ovarian cysts   . Morbid obesity with BMI of 40.0-44.9, adult ([place])     BMI roughly 43 noted on 02/09/2016 admission Morgan Maddox)  . Family history of premature CAD     Brother had an MI, sister has CAD. Mother had an MI at age 62. Still alive at 7.  . Vitamin D deficiency    Past Surgical History  Procedure Laterality Date  . Nasal sinus surgery    . Tonsillectomy    . Ovarian cyst surgery    . Nm myoview ltd  02/09/2016   [place]: EF 76%. Periapical defect with partial improvement during rest. No wall motion abnormality noted. Likely related to breast attenuation, cannot exclude small area of periapical ischemia. --> Cardiac catheterization not recommended. LOW RISK   Review of Systems  10 point systems review negative except as above.     Objective:   Physical Exam  BP 130/84        P   88     T    97.3 F       R   16          Ht 5' 6.5"      Wt 273 lb        BMI 43.47  In No Distress. Exposed skin clear w/o rash, cyanosis, icterus or clubbing.   HEENT - Eac's patent. TM's Nl. EOM's full. PERRLA. NasoOroPharynx clear. Neck - supple. Nl Thyroid.  Chest - Clear equal BS  Cor - Nl HS. RRR w/o sig MGR.  MS- FROM w/o deformities. Muscle power, tone and bulk Nl. Gait Nl. Neuro -  Nl w/o focal abnormalities. Psyche - Mental status normal & appropriate.  No delusions, ideations or obvious mood abnormalities.    Assessment & Plan:  1. Elevated BP, hx - seems stable now with control of anxiety   2. Prediabetes  - discussed dieting & weight loss.   3. Vitamin D deficiency   4. Essential hypertension  - sertraline (ZOLOFT) 50 MG tablet; Take 1 &  1/2 to 2 tabs daily as directed for mood  Dispense: 180 tablet; Refill: 1  - recc increase to 1.5 tab = 75 mg for 2-3 weeks the increase to 2 tabs til f/u OV in 6 weeks.  5. Chest pain, non cardiac

## 2016-04-30 NOTE — Progress Notes (Deleted)
Patient ID: Morgan Maddox, female   DOB: [birth year], 55 y.o.   MRN: [id number]

## 2016-06-05 ENCOUNTER — Encounter: Payer: Self-pay | Admitting: Internal Medicine

## 2016-06-05 ENCOUNTER — Ambulatory Visit (INDEPENDENT_AMBULATORY_CARE_PROVIDER_SITE_OTHER): Payer: BLUE CROSS/BLUE SHIELD | Admitting: Internal Medicine

## 2016-06-05 VITALS — BP 140/84 | HR 76 | Temp 97.3°F | Resp 16 | Ht 66.5 in | Wt 275.4 lb

## 2016-06-05 DIAGNOSIS — E559 Vitamin D deficiency, unspecified: Secondary | ICD-10-CM | POA: Diagnosis not present

## 2016-06-05 DIAGNOSIS — R030 Elevated blood-pressure reading, without diagnosis of hypertension: Secondary | ICD-10-CM | POA: Diagnosis not present

## 2016-06-05 DIAGNOSIS — E782 Mixed hyperlipidemia: Secondary | ICD-10-CM

## 2016-06-05 DIAGNOSIS — Z79899 Other long term (current) drug therapy: Secondary | ICD-10-CM

## 2016-06-05 DIAGNOSIS — IMO0001 Reserved for inherently not codable concepts without codable children: Secondary | ICD-10-CM

## 2016-06-05 DIAGNOSIS — R7303 Prediabetes: Secondary | ICD-10-CM | POA: Diagnosis not present

## 2016-06-05 LAB — HEPATIC FUNCTION PANEL
ALK PHOS: 80 U/L (ref 33–130)
ALT: 16 U/L (ref 6–29)
AST: 13 U/L (ref 10–35)
Albumin: 4.2 g/dL (ref 3.6–5.1)
BILIRUBIN DIRECT: 0.1 mg/dL (ref <=0.2)
Indirect Bilirubin: 0.3 mg/dL (ref 0.2–1.2)
Total Bilirubin: 0.4 mg/dL (ref 0.2–1.2)
Total Protein: 6.7 g/dL (ref 6.1–8.1)

## 2016-06-05 LAB — CBC WITH DIFFERENTIAL/PLATELET
BASOS ABS: 81 {cells}/uL (ref 0–200)
BASOS PCT: 1 %
EOS ABS: 162 {cells}/uL (ref 15–500)
Eosinophils Relative: 2 %
HCT: 42.9 % (ref 35.0–45.0)
HEMOGLOBIN: 14.2 g/dL (ref 11.7–15.5)
LYMPHS ABS: 2349 {cells}/uL (ref 850–3900)
Lymphocytes Relative: 29 %
MCH: 28.7 pg (ref 27.0–33.0)
MCHC: 33.1 g/dL (ref 32.0–36.0)
MCV: 86.8 fL (ref 80.0–100.0)
MPV: 9.4 fL (ref 7.5–12.5)
Monocytes Absolute: 405 cells/uL (ref 200–950)
Monocytes Relative: 5 %
NEUTROS ABS: 5103 {cells}/uL (ref 1500–7800)
Neutrophils Relative %: 63 %
Platelets: 373 10*3/uL (ref 140–400)
RBC: 4.94 MIL/uL (ref 3.80–5.10)
RDW: 13.8 % (ref 11.0–15.0)
WBC: 8.1 10*3/uL (ref 3.8–10.8)

## 2016-06-05 LAB — TSH: TSH: 1.79 m[IU]/L

## 2016-06-05 LAB — BASIC METABOLIC PANEL WITH GFR
BUN: 8 mg/dL (ref 7–25)
CHLORIDE: 104 mmol/L (ref 98–110)
CO2: 26 mmol/L (ref 20–31)
CREATININE: 0.84 mg/dL (ref 0.50–1.05)
Calcium: 9.1 mg/dL (ref 8.6–10.4)
GFR, Est African American: >89 mL/min (ref >=60)
GFR, Est Non African American: 80 mL/min (ref >=60)
Glucose, Bld: 98 mg/dL (ref 65–99)
POTASSIUM: 4.1 mmol/L (ref 3.5–5.3)
SODIUM: 140 mmol/L (ref 135–146)

## 2016-06-05 LAB — LIPID PANEL
CHOL/HDL RATIO: 3.1 ratio (ref <=5.0)
Cholesterol: 214 mg/dL — ABNORMAL HIGH (ref 125–200)
HDL: 70 mg/dL (ref >=46)
LDL CALC: 114 mg/dL (ref <130)
Triglycerides: 151 mg/dL — ABNORMAL HIGH (ref <150)
VLDL: 30 mg/dL (ref <30)

## 2016-06-05 LAB — MAGNESIUM: MAGNESIUM: 1.9 mg/dL (ref 1.5–2.5)

## 2016-06-05 MED ORDER — CITALOPRAM HYDROBROMIDE 40 MG PO TABS: 40.0000 mg | ORAL_TABLET | Freq: Every day | ORAL | 1 refills | Status: DC

## 2016-06-05 NOTE — Progress Notes (Signed)
[facility]                       Unk Pinto, M.D.        Uvaldo Bristle. Silverio Lay, P.A.-C       Starlyn Skeans, P.A.-C   [facility]                [address] [place], [place] [id number] Telephone [phone] Telefax [phone] ______________________________________________________________________     This very nice 55 y.o. presents for  follow up with labile Hypertension, Hyperlipidemia, Pre-Diabetes and Vitamin D Deficiency. Patient also was recently started on Sertraline for anxiety, panic attacks and depression. She does report feeling calmer, less stressed and admits family and friends have likewise noticed very positive effects in her attitude and demeanor. Unfortunately she also has developed rather severe diarrhea which is compromising her quality of life.     Patient is also monitored proactively for elevated BP which has been labile in the past. BP has been controlled and today's BP is borderline at 140/84. Patient has had no complaints of any cardiac type chest pain, palpitations, dyspnea/orthopnea/PND, dizziness, claudication, or dependent edema.    Hyperlipidemia is controlled with diet. Patient denies myalgias or other med SE's. Last Lipids were  Lab Results  Component Value Date   CHOL 183 03/23/2015   HDL 70 03/23/2015   LDLCALC 87 03/23/2015   TRIG 132 03/23/2015   CHOLHDL 2.6 03/23/2015      Also, the patient has history of Morbid Obesity (BMI 43+) and consequent PreDiabetes with A1c of 5.7% in 2014 and has had no symptoms of reactive hypoglycemia, diabetic polys, paresthesias or visual blurring.  Last A1c was  Lab Results  Component Value Date   HGBA1C 5.9 (H) 03/23/2015       Further, the patient also has history of Vitamin D Deficiency and does not regularly supplements vitamin D as requested. Last vitamin D was   Lab Results  Component Value Date   VD25OH 20 (L) 03/23/2015    Current Outpatient Prescriptions on File Prior to Visit  Medication Sig  . aspirin 81 MG chewable tablet Chew 81 mg by mouth daily.  . Cholecalciferol (VITAMIN D3) 5000 UNIT/ML LIQD Take 2 mLs by mouth daily.  . Cyanocobalamin (VITAMIN B-12) 5000 MCG SUBL Place under the tongue daily.   No current facility-administered medications on file prior to visit.    Allergies  Allergen Reactions  . Benadryl [Diphenhydramine Hcl]   . Caladryl [Pramoxine-Calamine]   . Calamine    PMHx:   Past Medical History:  Diagnosis Date  . Bilateral ovarian cysts   . Family history of premature CAD    Brother had an MI, sister has CAD. Mother had an MI at age 41. Still alive at 68.  [name] History of mononucleosis 04/21/2013  . History of sinus surgery 04/21/2013  . Hypertriglyceridemia 04/21/2013  . Morbid obesity with BMI of 40.0-44.9, adult ([facility])    BMI roughly 43 noted on 02/09/2016 admission [name])  . 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 Vitamin D deficiency    Immunization History  Administered Date(s) Administered  . PPD Test 03/23/2015   Past Surgical History:  Procedure Laterality Date  . NASAL SINUS SURGERY    . NM MYOVIEW LTD  02/09/2016   : EF 76%. Periapical defect with partial improvement during  rest. No wall motion abnormality noted. Likely related to breast attenuation, cannot exclude small area of periapical ischemia. --> Cardiac catheterization not recommended. LOW RISK  . OVARIAN CYST SURGERY    . TONSILLECTOMY     FHx:    Reviewed / unchanged  SHx:    Reviewed / unchanged  Systems Review:  Constitutional: Denies fever, chills, wt changes, headaches, insomnia, fatigue, night sweats, change in appetite. Eyes: Denies redness, blurred vision, diplopia, discharge, itchy, watery eyes.  ENT: Denies discharge, congestion, post nasal drip, epistaxis, sore throat, earache, hearing loss, dental pain, tinnitus, vertigo, sinus pain, snoring.  CV: Denies chest pain,  palpitations, irregular heartbeat, syncope, dyspnea, diaphoresis, orthopnea, PND, claudication or edema. Respiratory: denies cough, dyspnea, DOE, pleurisy, hoarseness, laryngitis, wheezing.  Gastrointestinal: Denies dysphagia, odynophagia, heartburn, reflux, water brash, abdominal pain or cramps, nausea, vomiting, bloating, diarrhea, constipation, hematemesis, melena, hematochezia  or hemorrhoids. Genitourinary: Denies dysuria, frequency, urgency, nocturia, hesitancy, discharge, hematuria or flank pain. Musculoskeletal: Denies arthralgias, myalgias, stiffness, jt. swelling, pain, limping or strain/sprain.  Skin: Denies pruritus, rash, hives, warts, acne, eczema or change in skin lesion(s). Neuro: No weakness, tremor, incoordination, spasms, paresthesia or pain. Psychiatric: Denies confusion, memory loss or sensory loss. Endo: Denies change in weight, skin or hair change.  Heme/Lymph: No excessive bleeding, bruising or enlarged lymph nodes.  Physical Exam  BP 140/84   Pulse 76   Temp 97.3 F (36.3 C)   Resp 16   Ht 5' 6.5" (1.689 m)   Wt 275 lb 6.4 oz (124.9 kg)   BMI 43.78 kg/m   Appears well nourished and in no distress. Eyes: PERRLA, EOMs, conjunctiva no swelling or erythema. Sinuses: No frontal/maxillary tenderness ENT/Mouth: EAC's clear, TM's nl w/o erythema, bulging. Nares clear w/o erythema, swelling, exudates. Oropharynx clear without erythema or exudates. Oral hygiene is good. Tongue normal, non obstructing. Hearing intact.  Neck: Supple. Thyroid nl. Car 2+/2+ without bruits, nodes or JVD. Chest: Respirations nl with BS clear & equal w/o rales, rhonchi, wheezing or stridor.  Cor: Heart sounds normal w/ regular rate and rhythm without sig. murmurs, gallops, clicks, or rubs. Peripheral pulses normal and equal  without edema.  Abdomen: Soft & bowel sounds normal. Non-tender w/o guarding, rebound, hernias, masses, or organomegaly.  Lymphatics: Unremarkable.  Musculoskeletal: Full  ROM all peripheral extremities, joint stability, 5/5 strength, and normal gait.  Skin: Warm, dry without exposed rashes, lesions or ecchymosis apparent.  Neuro: Cranial nerves intact, reflexes equal bilaterally. Sensory-motor testing grossly intact. Tendon reflexes grossly intact.  Pysch: Alert & oriented x 3.  Insight and judgement nl & appropriate. No ideations.  Assessment and Plan:  1. Elevated BP / Labile HTN  - Continue medication, monitor blood pressure at home. Continue DASH diet. Reminder to go to the ER if any CP, SOB, nausea, dizziness, severe HA, changes vision/speech, left arm numbness and tingling and jaw pain.  - TSH  2. Mixed hyperlipidemia  - Continue diet/meds, exercise,& lifestyle modifications. Continue monitor periodic cholesterol/liver & renal functions  - Lipid panel - TSH  3. Prediabetes  - Continue diet, exercise, lifestyle modifications. Monitor appropriate labs. - Hemoglobin A1c - Insulin, random  4. Vitamin D deficiency  - Continue supplementation. - VITAMIN D 25 Hydroxy   5. Medication management - CBC with Differential/Platelet - BASIC METABOLIC PANEL WITH GFR - Hepatic function panel - Magnesium  6. Diarrhea   - suspect due to Zoloft - which is d/c'd and patient is Rx'd Citalopram 40 mg # 30 x 2 rf  And   - advised to take a probiotic and use Imodium/Loperamide up to #12 tabs/day prn.   - ROV 2-3 weeks to reassess   Recommended regular exercise, BP monitoring, weight control, and discussed med and SE's. Recommended labs to assess and monitor clinical status. Further disposition pending results of labs. Over 30 minutes of exam, counseling, chart review was performed

## 2016-06-05 NOTE — Patient Instructions (Addendum)
Recommend ProBiotic   ++++++++++++++++++++++++++++++++++++++++++++++++++++++ Recommend Imodium (Loperamide 2 mg)  Up to # 12 tabs / day   ++++++++++++++++++++++++++++++++++++++++++++++++++++++ Recommend Adult Low Dose Aspirin or  coated  Aspirin 81 mg daily  To reduce risk of Colon Cancer 20 %,  Skin Cancer 26 % ,  Melanoma 46%  and  Pancreatic cancer 60% ++++++++++++++++++++++++++++++++++++++++++++++++++++++ Vitamin D goal  is between 70-100.  Please make sure that you are taking your Vitamin D as directed.  It is very important as a natural anti-inflammatory  helping hair, skin, and nails, as well as reducing stroke and heart attack risk.  It helps your bones and helps with mood. It also decreases numerous cancer risks so please take it as directed.  Low Vit D is associated with a 200-300% higher risk for CANCER  and 200-300% higher risk for HEART   ATTACK  &  STROKE.   .....................................[name] It is also associated with higher death rate at younger ages,  autoimmune diseases like Rheumatoid arthritis, Lupus, Multiple Sclerosis.    Also many other serious conditions, like depression, Alzheimer's Dementia, infertility, muscle aches, fatigue, fibromyalgia - just to name a few. ++++++++++++++++++++++++++++++++++++++++++++++++ Recommend the book "The END of DIETING" by Dr Excell Seltzer  & the book "The END of DIABETES " by Dr Excell Seltzer At [email or web address] - get book & Audio CD's    Being diabetic has a  300% increased risk for heart attack, stroke, cancer, and alzheimer- type vascular dementia. It is very important that you work harder with diet by avoiding all foods that are white. Avoid white rice (brown & wild rice is OK), white potatoes (sweetpotatoes in moderation is OK), White bread or wheat bread or anything made out of white flour like bagels, donuts, rolls, buns, biscuits, cakes, pastries, cookies, pizza crust, and pasta (made from white flour & egg whites) -  vegetarian pasta or spinach or wheat pasta is OK. Multigrain breads like Arnold's or Pepperidge [employer], or multigrain sandwich thins or flatbreads.  Diet, exercise and weight loss can reverse and cure diabetes in the early stages.  Diet, exercise and weight loss is very important in the control and prevention of complications of diabetes which affects every system in your body, ie. Brain - dementia/stroke, eyes - glaucoma/blindness, heart - heart attack/heart failure, kidneys - dialysis, stomach - gastric paralysis, intestines - malabsorption, nerves - severe painful neuritis, circulation - gangrene & loss of a leg(s), and finally cancer and Alzheimers.    I recommend avoid fried & greasy foods,  sweets/candy, white rice (brown or wild rice or Quinoa is OK), white potatoes (sweet potatoes are OK) - anything made from white flour - bagels, doughnuts, rolls, buns, biscuits,white and wheat breads, pizza crust and traditional pasta made of white flour & egg white(vegetarian pasta or spinach or wheat pasta is OK).  Multi-grain bread is OK - like multi-grain flat bread or sandwich thins. Avoid alcohol in excess. Exercise is also important.    Eat all the vegetables you want - avoid meat, especially red meat and dairy - especially cheese.  Cheese is the most concentrated form of trans-fats which is the worst thing to clog up our arteries. Veggie cheese is OK which can be found in the fresh produce section at [employer] or [employer] or [employer]  ++++++++++++++++++++++++++++++++++++++++++++++++++ DASH Eating Plan  DASH stands for "Dietary Approaches to Stop Hypertension."   The DASH eating plan is a healthy eating plan that has been shown to reduce high blood pressure (hypertension). Additional health  benefits may include reducing the risk of type 2 diabetes mellitus, heart disease, and stroke. The DASH eating plan may also help with weight loss. WHAT DO I NEED TO KNOW ABOUT THE DASH EATING PLAN? For the  DASH eating plan, you will follow these general guidelines:  Choose foods with a percent daily value for sodium of less than 5% (as listed on the food label).  Use salt-free seasonings or herbs instead of table salt or sea salt.  Check with your health care provider or pharmacist before using salt substitutes.  Eat lower-sodium products, often labeled as "lower sodium" or "no salt added."  Eat fresh foods.  Eat more vegetables, fruits, and low-fat dairy products.  Choose whole grains. Look for the word "whole" as the first word in the ingredient list.  Choose fish   Limit sweets, desserts, sugars, and sugary drinks.  Choose heart-healthy fats.  Eat veggie cheese   Eat more home-cooked food and less restaurant, buffet, and fast food.  Limit fried foods.  Cook foods using methods other than frying.  Limit canned vegetables. If you do use them, rinse them well to decrease the sodium.  When eating at a restaurant, ask that your food be prepared with less salt, or no salt if possible.                      WHAT FOODS CAN I EAT? Read Dr Fara Olden Fuhrman's books on The End of Dieting & The End of Diabetes  Grains Whole grain or whole wheat bread. Brown rice. Whole grain or whole wheat pasta. Quinoa, bulgur, and whole grain cereals. Low-sodium cereals. Corn or whole wheat flour tortillas. Whole grain cornbread. Whole grain crackers. Low-sodium crackers.  Vegetables Fresh or frozen vegetables (raw, steamed, roasted, or grilled). Low-sodium or reduced-sodium tomato and vegetable juices. Low-sodium or reduced-sodium tomato sauce and paste. Low-sodium or reduced-sodium canned vegetables.   Fruits All fresh, canned (in natural juice), or frozen fruits.  Protein Products  All fish and seafood.  Dried beans, peas, or lentils. Unsalted nuts and seeds. Unsalted canned beans.  Dairy Low-fat dairy products, such as skim or 1% milk, 2% or reduced-fat cheeses, low-fat ricotta or cottage  cheese, or plain low-fat yogurt. Low-sodium or reduced-sodium cheeses.  Fats and Oils Tub margarines without trans fats. Light or reduced-fat mayonnaise and salad dressings (reduced sodium). Avocado. Safflower, olive, or canola oils. Natural peanut or almond butter.  Other Unsalted popcorn and pretzels. The items listed above may not be a complete list of recommended foods or beverages. Contact your dietitian for more options.  +++++++++++++++++++++++++++++++++++++++++++  WHAT FOODS ARE NOT RECOMMENDED? Grains/ White flour or wheat flour White bread. White pasta. White rice. Refined cornbread. Bagels and croissants. Crackers that contain trans fat.  Vegetables  Creamed or fried vegetables. Vegetables in a . Regular canned vegetables. Regular canned tomato sauce and paste. Regular tomato and vegetable juices.  Fruits Dried fruits. Canned fruit in light or heavy syrup. Fruit juice.  Meat and Other Protein Products Meat in general - RED mwaet & White meat.  Fatty cuts of meat. Ribs, chicken wings, bacon, sausage, bologna, salami, chitterlings, fatback, hot dogs, bratwurst, and packaged luncheon meats.  Dairy Whole or 2% milk, cream, half-and-half, and cream cheese. Whole-fat or sweetened yogurt. Full-fat cheeses or blue cheese. Nondairy creamers and whipped toppings. Processed cheese, cheese spreads, or cheese curds.  Condiments Onion and garlic salt, seasoned salt, table salt, and sea salt. Canned and packaged gravies. Worcestershire sauce.  Tartar sauce. Barbecue sauce. Teriyaki sauce. Soy sauce, including reduced sodium. Steak sauce. Fish sauce. Oyster sauce. Cocktail sauce. Horseradish. Ketchup and mustard. Meat flavorings and tenderizers. Bouillon cubes. Hot sauce. Tabasco sauce. Marinades. Taco seasonings. Relishes.  Fats and Oils Butter, stick margarine, lard, shortening and bacon fat. Coconut, palm kernel, or palm oils. Regular salad dressings.  Pickles and olives. Salted  popcorn and pretzels.  The items listed above may not be a complete list of foods and beverages to avoid.  Recommend Adult Low Dose Aspirin or  coated  Aspirin 81 mg daily  To reduce risk of Colon Cancer 20 %,  Skin Cancer 26 % ,  Melanoma 46%  and  Pancreatic cancer 60% ++++++++++++++++++++++++++++++++++++++++++++++++++++++ Vitamin D goal  is between 70-100.  Please make sure that you are taking your Vitamin D as directed.  It is very important as a natural anti-inflammatory  helping hair, skin, and nails, as well as reducing stroke and heart attack risk.  It helps your bones and helps with mood. It also decreases numerous cancer risks so please take it as directed.  Low Vit D is associated with a 200-300% higher risk for CANCER  and 200-300% higher risk for HEART   ATTACK  &  STROKE.   .....................................[name] It is also associated with higher death rate at younger ages,  autoimmune diseases like Rheumatoid arthritis, Lupus, Multiple Sclerosis.    Also many other serious conditions, like depression, Alzheimer's Dementia, infertility, muscle aches, fatigue, fibromyalgia - just to name a few. ++++++++++++++++++++++++++++++++++++++++++++++++ Recommend the book "The END of DIETING" by Dr Excell Seltzer  & the book "The END of DIABETES " by Dr Excell Seltzer At [email or web address] - get book & Audio CD's    Being diabetic has a  300% increased risk for heart attack, stroke, cancer, and alzheimer- type vascular dementia. It is very important that you work harder with diet by avoiding all foods that are white. Avoid white rice (brown & wild rice is OK), white potatoes (sweetpotatoes in moderation is OK), White bread or wheat bread or anything made out of white flour like bagels, donuts, rolls, buns, biscuits, cakes, pastries, cookies, pizza crust, and pasta (made from white flour & egg whites) - vegetarian pasta or spinach or wheat pasta is OK. Multigrain breads like Arnold's or [employer]  [employer], or multigrain sandwich thins or flatbreads.  Diet, exercise and weight loss can reverse and cure diabetes in the early stages.  Diet, exercise and weight loss is very important in the control and prevention of complications of diabetes which affects every system in your body, ie. Brain - dementia/stroke, eyes - glaucoma/blindness, heart - heart attack/heart failure, kidneys - dialysis, stomach - gastric paralysis, intestines - malabsorption, nerves - severe painful neuritis, circulation - gangrene & loss of a leg(s), and finally cancer and Alzheimers.    I recommend avoid fried & greasy foods,  sweets/candy, white rice (brown or wild rice or Quinoa is OK), white potatoes (sweet potatoes are OK) - anything made from white flour - bagels, doughnuts, rolls, buns, biscuits,white and wheat breads, pizza crust and traditional pasta made of white flour & egg white(vegetarian pasta or spinach or wheat pasta is OK).  Multi-grain bread is OK - like multi-grain flat bread or sandwich thins. Avoid alcohol in excess. Exercise is also important.    Eat all the vegetables you want - avoid meat, especially red meat and dairy - especially cheese.  Cheese is the most concentrated form of trans-fats which is the  worst thing to clog up our arteries. Veggie cheese is OK which can be found in the fresh produce section at [employer] or [employer] or [employer]  ++++++++++++++++++++++++++++++++++++++++++++++++++ DASH Eating Plan  DASH stands for "Dietary Approaches to Stop Hypertension."   The DASH eating plan is a healthy eating plan that has been shown to reduce high blood pressure (hypertension). Additional health benefits may include reducing the risk of type 2 diabetes mellitus, heart disease, and stroke. The DASH eating plan may also help with weight loss. WHAT DO I NEED TO KNOW ABOUT THE DASH EATING PLAN? For the DASH eating plan, you will follow these general guidelines:  Choose foods with a percent daily  value for sodium of less than 5% (as listed on the food label).  Use salt-free seasonings or herbs instead of table salt or sea salt.  Check with your health care provider or pharmacist before using salt substitutes.  Eat lower-sodium products, often labeled as "lower sodium" or "no salt added."  Eat fresh foods.  Eat more vegetables, fruits, and low-fat dairy products.  Choose whole grains. Look for the word "whole" as the first word in the ingredient list.  Choose fish   Limit sweets, desserts, sugars, and sugary drinks.  Choose heart-healthy fats.  Eat veggie cheese   Eat more home-cooked food and less restaurant, buffet, and fast food.  Limit fried foods.  Cook foods using methods other than frying.  Limit canned vegetables. If you do use them, rinse them well to decrease the sodium.  When eating at a restaurant, ask that your food be prepared with less salt, or no salt if possible.                      WHAT FOODS CAN I EAT? Read Dr Fara Olden Fuhrman's books on The End of Dieting & The End of Diabetes  Grains Whole grain or whole wheat bread. Brown rice. Whole grain or whole wheat pasta. Quinoa, bulgur, and whole grain cereals. Low-sodium cereals. Corn or whole wheat flour tortillas. Whole grain cornbread. Whole grain crackers. Low-sodium crackers.  Vegetables Fresh or frozen vegetables (raw, steamed, roasted, or grilled). Low-sodium or reduced-sodium tomato and vegetable juices. Low-sodium or reduced-sodium tomato sauce and paste. Low-sodium or reduced-sodium canned vegetables.   Fruits All fresh, canned (in natural juice), or frozen fruits.  Protein Products  All fish and seafood.  Dried beans, peas, or lentils. Unsalted nuts and seeds. Unsalted canned beans.  Dairy Low-fat dairy products, such as skim or 1% milk, 2% or reduced-fat cheeses, low-fat ricotta or cottage cheese, or plain low-fat yogurt. Low-sodium or reduced-sodium cheeses.  Fats and Oils Tub  margarines without trans fats. Light or reduced-fat mayonnaise and salad dressings (reduced sodium). Avocado. Safflower, olive, or canola oils. Natural peanut or almond butter.  Other Unsalted popcorn and pretzels. The items listed above may not be a complete list of recommended foods or beverages. Contact your dietitian for more options.  +++++++++++++++++++++++++++++++++++++++++++  WHAT FOODS ARE NOT RECOMMENDED? Grains/ White flour or wheat flour White bread. White pasta. White rice. Refined cornbread. Bagels and croissants. Crackers that contain trans fat.  Vegetables  Creamed or fried vegetables. Vegetables in a . Regular canned vegetables. Regular canned tomato sauce and paste. Regular tomato and vegetable juices.  Fruits Dried fruits. Canned fruit in light or heavy syrup. Fruit juice.  Meat and Other Protein Products Meat in general - RED mwaet & White meat.  Fatty cuts of meat. Ribs, chicken wings,  bacon, sausage, bologna, salami, chitterlings, fatback, hot dogs, bratwurst, and packaged luncheon meats.  Dairy Whole or 2% milk, cream, half-and-half, and cream cheese. Whole-fat or sweetened yogurt. Full-fat cheeses or blue cheese. Nondairy creamers and whipped toppings. Processed cheese, cheese spreads, or cheese curds.  Condiments Onion and garlic salt, seasoned salt, table salt, and sea salt. Canned and packaged gravies. Worcestershire sauce. Tartar sauce. Barbecue sauce. Teriyaki sauce. Soy sauce, including reduced sodium. Steak sauce. Fish sauce. Oyster sauce. Cocktail sauce. Horseradish. Ketchup and mustard. Meat flavorings and tenderizers. Bouillon cubes. Hot sauce. Tabasco sauce. Marinades. Taco seasonings. Relishes.  Fats and Oils Butter, stick margarine, lard, shortening and bacon fat. Coconut, palm kernel, or palm oils. Regular salad dressings.  Pickles and olives. Salted popcorn and pretzels.  The items listed above may not be a complete list of foods and beverages  to avoid.

## 2016-06-06 ENCOUNTER — Other Ambulatory Visit: Payer: Self-pay | Admitting: *Deleted

## 2016-06-06 LAB — VITAMIN D 25 HYDROXY (VIT D DEFICIENCY, FRACTURES): VIT D 25 HYDROXY: 29 ng/mL — AB (ref 30–100)

## 2016-06-06 LAB — HEMOGLOBIN A1C
HEMOGLOBIN A1C: 6.1 % — AB (ref <5.7)
Mean Plasma Glucose: 128 mg/dL

## 2016-06-06 LAB — INSULIN, RANDOM: Insulin: 6.2 u[IU]/mL (ref 2.0–19.6)

## 2016-06-06 MED ORDER — CITALOPRAM HYDROBROMIDE 40 MG PO TABS: 40.0000 mg | ORAL_TABLET | Freq: Every day | ORAL | 1 refills | Status: DC

## 2016-06-11 ENCOUNTER — Ambulatory Visit: Payer: Self-pay | Admitting: Internal Medicine

## 2016-07-03 ENCOUNTER — Ambulatory Visit (INDEPENDENT_AMBULATORY_CARE_PROVIDER_SITE_OTHER): Payer: BLUE CROSS/BLUE SHIELD | Admitting: Internal Medicine

## 2016-07-03 ENCOUNTER — Encounter: Payer: Self-pay | Admitting: Internal Medicine

## 2016-07-03 VITALS — BP 126/80 | HR 91 | Temp 98.1°F | Resp 16 | Ht 66.5 in | Wt 272.6 lb

## 2016-07-03 DIAGNOSIS — IMO0001 Reserved for inherently not codable concepts without codable children: Secondary | ICD-10-CM

## 2016-07-03 DIAGNOSIS — R030 Elevated blood-pressure reading, without diagnosis of hypertension: Secondary | ICD-10-CM | POA: Diagnosis not present

## 2016-07-03 DIAGNOSIS — R7303 Prediabetes: Secondary | ICD-10-CM

## 2016-07-03 DIAGNOSIS — F4002 Agoraphobia without panic disorder: Secondary | ICD-10-CM

## 2016-07-03 DIAGNOSIS — F410 Panic disorder [episodic paroxysmal anxiety] without agoraphobia: Secondary | ICD-10-CM | POA: Insufficient documentation

## 2016-07-03 MED ORDER — PHENTERMINE HCL 37.5 MG PO TABS: ORAL_TABLET | ORAL | 2 refills | Status: DC

## 2016-07-03 MED ORDER — METFORMIN HCL ER 500 MG PO TB24: ORAL_TABLET | ORAL | 1 refills | Status: DC

## 2016-07-03 NOTE — Patient Instructions (Signed)

## 2016-07-03 NOTE — Progress Notes (Signed)
  Subjective:    Patient ID: Morgan Maddox, female    DOB: October 24, 1961, 55 y.o.   MRN: [id number]  HPI  Patient present for f/u of severe mood issues with Social Anxiety and was switched from Zoloft to Celexa for diarrhea which has resolved. Other issues discussed were her prediabetes consequent of her severe Morbid Obesity (BMI 43+) & lack of exercise . Patient acknowledges improved sense of calmness & well-being since on Celexa and has been more socially active and not lying in bed at home all day. Also note patient's BP is normal & improved as her anxiety has improved.   Medication Sig  . aspirin 81 MG chewable tablet Chew 81 mg by mouth daily.  . Cholecalciferol (VITAMIN D3) 5000 UNIT/ML LIQD Take 2 mLs by mouth daily.  . citalopram (CELEXA) 40 MG tablet Take 1 tablet (40 mg total) by mouth daily  . VIT B-12 5000 MCG SUBL Place under the tongue daily.  . Lactobacillus   Chew by mouth.   Allergies  Allergen Reactions  . Benadryl [Diphenhydramine Hcl]   . Caladryl [Pramoxine-Calamine]   . Calamine    Past Medical History:  Diagnosis Date  . Bilateral ovarian cysts   . Family history of premature CAD    Brother had an MI, sister has CAD. Mother had an MI at age 67. Still alive at 50.  [name] History of mononucleosis 04/21/2013  . History of sinus surgery 04/21/2013  . Hypertriglyceridemia 04/21/2013  . Morbid obesity with BMI of 40.0-44.9, adult ([place])    BMI roughly 43 noted on 02/09/2016 admission [name])  . 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 Vitamin D deficiency    Review of Systems  10 point systems review negative except as above.    Objective:   Physical Exam  BP 126/80   Pulse 91   Temp 98.1 F (36.7 C)   Resp 16   Ht 5' 6.5" (1.689 m)   Wt 272 lb 9.6 oz (123.7 kg)   SpO2 96%   BMI 43.34 kg/m   Central Morbid Obesity.  HEENT - Eac's patent. TM's Nl. EOM's full. PERRLA. NasoOroPharynx clear. Neck - supple. Nl Thyroid. Carotids 2+  Chest - Clear equal  BS. Cor - Nl HS. RRR w/o sig MGR. PP 1(+). No edema. MS- FROM w/o deformities. Muscle power, tone and bulk Nl. Gait Nl. Neuro - No obvious Cr N abnormalities. Nl w/o focal abnormalities. Psyche - Mental status normal & appropriate.  No delusions, ideations or obvious mood abnormalities.  Assessment & Plan:   1. Elevated BP   2. Prediabetes  - metFORMIN (GLUCOPHAGE XR) 500 MG 24 hr tablet; Take 3 to 4 tablets daily for insulin Resistance & weight loss  Dispense: 360 tablet; Refill: 1  3. Morbid obesity due to excess calories (HCC)  - phentermine (ADIPEX-P) 37.5 MG tablet; Take 1/2 to 1 tablet every morning for dieting & weightloss  Dispense: 30 tablet; Refill: 2  4. Panic type anxiety neurosis ________________________________  - Discussed meds & SE's.  - ROV 2 months

## 2016-07-26 ENCOUNTER — Other Ambulatory Visit: Payer: Self-pay | Admitting: Internal Medicine

## 2016-07-26 ENCOUNTER — Telehealth: Payer: Self-pay | Admitting: *Deleted

## 2016-07-26 NOTE — Telephone Encounter (Signed)
Patient called and states she is not tolerating the Citalopram 40 mg.  She states she has been having nausea and vomiting, trouble sleeping and eye pain.  Per Dr Melford Aase, she can try breaking the 40 mg tablet into quarters and take 1/4 tablet twice a day.  He recommends she start the Metformin, but it is her option as to whether she starts the Phentermine.  Patient aware and will try the suggestions.

## 2016-08-28 ENCOUNTER — Other Ambulatory Visit: Payer: Self-pay | Admitting: *Deleted

## 2016-08-28 MED ORDER — PROMETHAZINE-DM 6.25-15 MG/5ML PO SYRP: 5.0000 mL | ORAL_SOLUTION | Freq: Four times a day (QID) | ORAL | 1 refills | Status: DC | PRN

## 2016-10-08 ENCOUNTER — Encounter: Payer: Self-pay | Admitting: Internal Medicine

## 2016-10-08 ENCOUNTER — Ambulatory Visit (INDEPENDENT_AMBULATORY_CARE_PROVIDER_SITE_OTHER): Payer: BLUE CROSS/BLUE SHIELD | Admitting: Internal Medicine

## 2016-10-08 VITALS — BP 176/92 | HR 84 | Temp 97.7°F | Resp 16 | Ht 66.5 in | Wt 279.6 lb

## 2016-10-08 DIAGNOSIS — Z79899 Other long term (current) drug therapy: Secondary | ICD-10-CM | POA: Diagnosis not present

## 2016-10-08 DIAGNOSIS — I10 Essential (primary) hypertension: Secondary | ICD-10-CM

## 2016-10-08 DIAGNOSIS — Z91148 Patient's other noncompliance with medication regimen for other reason: Secondary | ICD-10-CM

## 2016-10-08 DIAGNOSIS — F418 Other specified anxiety disorders: Secondary | ICD-10-CM | POA: Diagnosis not present

## 2016-10-08 DIAGNOSIS — R7303 Prediabetes: Secondary | ICD-10-CM

## 2016-10-08 DIAGNOSIS — F32A Depression, unspecified: Secondary | ICD-10-CM

## 2016-10-08 DIAGNOSIS — E782 Mixed hyperlipidemia: Secondary | ICD-10-CM | POA: Diagnosis not present

## 2016-10-08 DIAGNOSIS — Z9114 Patient's other noncompliance with medication regimen: Secondary | ICD-10-CM | POA: Diagnosis not present

## 2016-10-08 DIAGNOSIS — R300 Dysuria: Secondary | ICD-10-CM

## 2016-10-08 DIAGNOSIS — E559 Vitamin D deficiency, unspecified: Secondary | ICD-10-CM | POA: Diagnosis not present

## 2016-10-08 DIAGNOSIS — F419 Anxiety disorder, unspecified: Secondary | ICD-10-CM

## 2016-10-08 DIAGNOSIS — F329 Major depressive disorder, single episode, unspecified: Secondary | ICD-10-CM

## 2016-10-08 MED ORDER — BUPROPION HCL ER (XL) 300 MG PO TB24: 300.0000 mg | ORAL_TABLET | ORAL | 5 refills | Status: DC

## 2016-10-08 NOTE — Patient Instructions (Signed)

## 2016-10-08 NOTE — Progress Notes (Signed)
[facility] Unk Pinto, M.D.        Uvaldo Bristle. Silverio Lay, P.A.-C       Starlyn Skeans, P.A.-C  [facility]                [address] [place], [place] [id number] Telephone [phone] Telefax [phone] ______________________________________________________________________     This very nice 55 y.o. Poorly compliant DWF presents for 3 month follow up with Hypertension, Hyperlipidemia, Pre-Diabetes and Vitamin D Deficiency. Patient is also followed for Social Anxiety Disorder Int to Zoloft) and was compensated well on Celexa til she stopped it for c/o "eye pains" (?!!!?).  She does admit improvement in her mood , depression & anxiety when on treatment. Today she's also c/o urinary burning and frequency and denies fevers, chills, sweats, rash or change in color of her urine.      Patient is treated for HTN since May 2016  & BP has been controlled at home. Patient has had a negative Cardiac w/u by Dr Ellyn Hack. Today's BP was elevated at 176/92 and was rechecked at 153/90. Patient has had no complaints of any cardiac type chest pain, palpitations, dyspnea/orthopnea/PND, dizziness, claudication, or dependent edema.     Hyperlipidemia is not controlled with diet. Last Lipids were not at goal: Lab Results  Component Value Date   CHOL 214 (H) 06/05/2016   HDL 70 06/05/2016   LDLCALC 114 06/05/2016   TRIG 151 (H) 06/05/2016   CHOLHDL 3.1 06/05/2016      Also, the patient has history of Severe Morbid Obesity (BMI 44+) and consequent PreDiabetes with A1c 5.7% in June 2014 and has been started on Metformin for Insulin Resistance and severe Obesity, but is only taking 1 x/day due to c/o "pill too big". Patient's weight at high school graduation was 145 # and current weight is 279#  (134# weight gain). Patient denies symptoms of reactive hypoglycemia, diabetic polys, paresthesias or visual blurring.  Last A1c was  still not at goal: Lab Results  Component Value Date   HGBA1C 6.1 (H) 06/05/2016      Further, the patient also has history of Vitamin D Deficiency and supplements vitamin D without any suspected side-effects. Last vitamin D was not at goal:  Lab Results  Component Value Date   VD25OH 39 (L) 06/05/2016   Current Outpatient Prescriptions on File Prior to Visit  Medication Sig  . aspirin 81 MG  Chew  daily.  [name] VIT D 5,000 U/ML LIQD Take 2 mLs (10,000 U) daily.  . citalopram  40 MG tablet Take 1 tab daily  . VIT B-12 5000 MCG SUBL Place under the tongue daily.  . Lactobacillus  Chew by mouth.  . metFORMIN-XR 500 MG  Take 3 to 4 tablets daily for insulin Resistance & weight loss  . phentermine  37.5 MG  Take 1/2 to 1 tablet every morning for dieting & weightloss   Allergies  Allergen Reactions  . Benadryl [Diphenhydramine Hcl]   . Caladryl [Pramoxine-Calamine]   . Calamine    PMHx:   Past Medical History:  Diagnosis Date  . Bilateral ovarian cysts   . Family history of premature CAD    Brother had an MI, sister has CAD. Mother had an MI at age 39. Still alive at 50.  [name] History of mononucleosis 04/21/2013  . History of sinus surgery 04/21/2013  . Hypertriglyceridemia  04/21/2013  . Morbid obesity with BMI of 40.0-44.9, adult ([place])    BMI roughly 43 noted on 02/09/2016 admission [name])  . 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 Vitamin D deficiency    Immunization History  Administered Date(s) Administered  . PPD Test 03/23/2015   Past Surgical History:  Procedure Laterality Date  . NASAL SINUS SURGERY    . NM MYOVIEW LTD  02/09/2016   [place]: EF 76%. Periapical defect with partial improvement during rest. No wall motion abnormality noted. Likely related to breast attenuation, cannot exclude small area of periapical ischemia. --> Cardiac catheterization not recommended. LOW RISK  . OVARIAN CYST SURGERY    . TONSILLECTOMY     FHx:    Reviewed / unchanged  SHx:     Reviewed / unchanged  Systems Review:  Constitutional: Denies fever, chills, wt changes, headaches, insomnia, fatigue, night sweats, change in appetite. Eyes: Denies redness, blurred vision, diplopia, discharge, itchy, watery eyes.  ENT: Denies discharge, congestion, post nasal drip, epistaxis, sore throat, earache, hearing loss, dental pain, tinnitus, vertigo, sinus pain, snoring.  CV: Denies chest pain, palpitations, irregular heartbeat, syncope, dyspnea, diaphoresis, orthopnea, PND, claudication or edema. Respiratory: denies cough, dyspnea, DOE, pleurisy, hoarseness, laryngitis, wheezing.  Gastrointestinal: Denies dysphagia, odynophagia, heartburn, reflux, water brash, abdominal pain or cramps, nausea, vomiting, bloating, diarrhea, constipation, hematemesis, melena, hematochezia  or hemorrhoids. Genitourinary: Denies dysuria, frequency, urgency, nocturia, hesitancy, discharge, hematuria or flank pain. Musculoskeletal: Denies arthralgias, myalgias, stiffness, jt. swelling, pain, limping or strain/sprain.  Skin: Denies pruritus, rash, hives, warts, acne, eczema or change in skin lesion(s). Neuro: No weakness, tremor, incoordination, spasms, paresthesia or pain. Psychiatric: Denies confusion, memory loss or sensory loss. Endo: Denies change in weight, skin or hair change.  Heme/Lymph: No excessive bleeding, bruising or enlarged lymph nodes.  Physical Exam BP  176/92 -> reck'd at 153/90  P 84   T 97.7 F    R 16   Ht 5' 6.5"    Wt 279 lb 9.6 oz    BMI 44.45   Appears  overnourished and in no distress.  Eyes: PERRLA, EOMs, conjunctiva no swelling or erythema. Sinuses: No frontal/maxillary tenderness ENT/Mouth: EAC's clear, TM's nl w/o erythema, bulging. Nares clear w/o erythema, swelling, exudates. Oropharynx clear without erythema or exudates. Oral hygiene is good. Tongue normal, non obstructing. Hearing intact.  Neck: Supple. Thyroid nl. Car 2+/2+ without bruits, nodes or JVD. Chest:  Respirations nl with BS clear & equal w/o rales, rhonchi, wheezing or stridor.  Cor: Heart sounds normal w/ regular rate and rhythm without sig. murmurs, gallops, clicks, or rubs. Peripheral pulses normal and equal  without edema.  Abdomen: Soft, rotund  & bowel sounds normal. Non-tender w/o guarding, rebound, hernias, masses, or organomegaly.  Lymphatics: Unremarkable.  Musculoskeletal: Full ROM all peripheral extremities, joint stability, 5/5 strength, and normal gait.  Skin: Warm, dry without exposed rashes, lesions or ecchymosis apparent.  Neuro: Cranial nerves intact, reflexes equal bilaterally. Sensory-motor testing grossly intact. Tendon reflexes grossly intact.  Pysch: Alert & oriented x 3.  Insight and judgement nl & appropriate. No ideations.  Assessment and Plan:   1. Essential hypertension  - recommend monitor random BP's at various times thru-out the day, document  and call if remain abnormal to establish a trend.  - Continue DASH diet. Reminder to go to the ER if any CP,  SOB, nausea, dizziness, severe HA, changes vision/speech,  left arm numbness and tingling and jaw pain. - TSH  2. Mixed hyperlipidemia  -  Continue diet/meds, exercise,& lifestyle modifications.  - Continue monitor periodic cholesterol/liver & renal functions  - Lipid panel - TSH  3. Prediabetes  - Continue diet, exercise, lifestyle modifications. Monitor appropriate labs. - Hemoglobin A1c - Insulin, random  4. Vitamin D deficiency  - Continue supplementation. - VITAMIN D 25 Hydroxy   5. Dysuria  - Urinalysis, Routine w reflex microscopic - Urine culture  6. Medication management  - CBC with Differential/Platelet - BASIC METABOLIC PANEL WITH GFR - Hepatic function panel - Magnesium  7. Anxiety and depression  - Given Rx Wellbutrin 150 mg XL #30 for 1st month, then Rx for  - buPROPion (WELLBUTRIN XL) 300 MG 24 hr tablet; Take 1 tablet (300 mg total) by mouth every morning.  Dispense:  30 tablet; Refill: 5        Recommended regular exercise, BP monitoring, weight control, and discussed med and SE's. Recommended labs to assess and monitor clinical status. Further disposition pending results of labs. Over 30 minutes of exam, counseling, chart review was performed

## 2016-10-09 LAB — URINALYSIS, ROUTINE W REFLEX MICROSCOPIC
BILIRUBIN URINE: NEGATIVE
Glucose, UA: NEGATIVE
Hgb urine dipstick: NEGATIVE
KETONES UR: NEGATIVE
Leukocytes, UA: NEGATIVE
Nitrite: NEGATIVE
PH: 6 (ref 5.0–8.0)
Protein, ur: NEGATIVE
SPECIFIC GRAVITY, URINE: 1.007 (ref 1.001–1.035)

## 2016-10-09 LAB — BASIC METABOLIC PANEL WITH GFR
BUN: 12 mg/dL (ref 7–25)
CALCIUM: 9.5 mg/dL (ref 8.6–10.4)
CO2: 26 mmol/L (ref 20–31)
CREATININE: 0.86 mg/dL (ref 0.50–1.05)
Chloride: 103 mmol/L (ref 98–110)
GFR, EST NON AFRICAN AMERICAN: 77 mL/min (ref >=60)
GFR, Est African American: 89 mL/min (ref >=60)
Glucose, Bld: 99 mg/dL (ref 65–99)
Potassium: 4.2 mmol/L (ref 3.5–5.3)
SODIUM: 138 mmol/L (ref 135–146)

## 2016-10-09 LAB — CBC WITH DIFFERENTIAL/PLATELET
BASOS ABS: 0 {cells}/uL (ref 0–200)
Basophils Relative: 0 %
EOS PCT: 1 %
Eosinophils Absolute: 92 cells/uL (ref 15–500)
HCT: 41.9 % (ref 35.0–45.0)
Hemoglobin: 13.9 g/dL (ref 11.7–15.5)
LYMPHS ABS: 2392 {cells}/uL (ref 850–3900)
Lymphocytes Relative: 26 %
MCH: 28.5 pg (ref 27.0–33.0)
MCHC: 33.2 g/dL (ref 32.0–36.0)
MCV: 86 fL (ref 80.0–100.0)
MONOS PCT: 4 %
MPV: 9.5 fL (ref 7.5–12.5)
Monocytes Absolute: 368 cells/uL (ref 200–950)
NEUTROS ABS: 6348 {cells}/uL (ref 1500–7800)
NEUTROS PCT: 69 %
PLATELETS: 347 10*3/uL (ref 140–400)
RBC: 4.87 MIL/uL (ref 3.80–5.10)
RDW: 13.5 % (ref 11.0–15.0)
WBC: 9.2 10*3/uL (ref 3.8–10.8)

## 2016-10-09 LAB — LIPID PANEL
CHOL/HDL RATIO: 2.8 ratio (ref <5.0)
CHOLESTEROL: 195 mg/dL (ref <200)
HDL: 70 mg/dL (ref >50)
LDL Cholesterol: 104 mg/dL — ABNORMAL HIGH (ref <100)
TRIGLYCERIDES: 107 mg/dL (ref <150)
VLDL: 21 mg/dL (ref <30)

## 2016-10-09 LAB — VITAMIN D 25 HYDROXY (VIT D DEFICIENCY, FRACTURES): Vit D, 25-Hydroxy: 42 ng/mL (ref 30–100)

## 2016-10-09 LAB — MAGNESIUM: MAGNESIUM: 1.8 mg/dL (ref 1.5–2.5)

## 2016-10-09 LAB — HEPATIC FUNCTION PANEL
ALT: 20 U/L (ref 6–29)
AST: 19 U/L (ref 10–35)
Albumin: 4.2 g/dL (ref 3.6–5.1)
Alkaline Phosphatase: 80 U/L (ref 33–130)
BILIRUBIN DIRECT: 0.1 mg/dL (ref <=0.2)
BILIRUBIN INDIRECT: 0.2 mg/dL (ref 0.2–1.2)
BILIRUBIN TOTAL: 0.3 mg/dL (ref 0.2–1.2)
Total Protein: 6.5 g/dL (ref 6.1–8.1)

## 2016-10-09 LAB — URINE CULTURE

## 2016-10-09 LAB — HEMOGLOBIN A1C
HEMOGLOBIN A1C: 5.8 % — AB (ref <5.7)
MEAN PLASMA GLUCOSE: 120 mg/dL

## 2016-10-09 LAB — INSULIN, RANDOM: Insulin: 8 u[IU]/mL (ref 2.0–19.6)

## 2016-10-09 LAB — TSH: TSH: 1.27 mIU/L

## 2016-11-26 DIAGNOSIS — J320 Chronic maxillary sinusitis: Secondary | ICD-10-CM | POA: Diagnosis not present

## 2016-11-26 DIAGNOSIS — H6121 Impacted cerumen, right ear: Secondary | ICD-10-CM | POA: Diagnosis not present

## 2016-11-26 DIAGNOSIS — J3081 Allergic rhinitis due to animal (cat) (dog) hair and dander: Secondary | ICD-10-CM | POA: Diagnosis not present

## 2016-11-26 DIAGNOSIS — J301 Allergic rhinitis due to pollen: Secondary | ICD-10-CM | POA: Diagnosis not present

## 2016-12-10 DIAGNOSIS — H9041 Sensorineural hearing loss, unilateral, right ear, with unrestricted hearing on the contralateral side: Secondary | ICD-10-CM | POA: Diagnosis not present

## 2016-12-10 DIAGNOSIS — H9311 Tinnitus, right ear: Secondary | ICD-10-CM | POA: Diagnosis not present

## 2016-12-10 DIAGNOSIS — H8141 Vertigo of central origin, right ear: Secondary | ICD-10-CM | POA: Diagnosis not present

## 2016-12-17 DIAGNOSIS — H903 Sensorineural hearing loss, bilateral: Secondary | ICD-10-CM | POA: Diagnosis not present

## 2016-12-17 DIAGNOSIS — H8101 Meniere's disease, right ear: Secondary | ICD-10-CM | POA: Diagnosis not present

## 2016-12-17 DIAGNOSIS — J320 Chronic maxillary sinusitis: Secondary | ICD-10-CM | POA: Diagnosis not present

## 2016-12-17 DIAGNOSIS — J301 Allergic rhinitis due to pollen: Secondary | ICD-10-CM | POA: Diagnosis not present

## 2017-01-22 ENCOUNTER — Ambulatory Visit: Payer: Self-pay | Admitting: Internal Medicine

## 2017-04-23 DIAGNOSIS — J322 Chronic ethmoidal sinusitis: Secondary | ICD-10-CM | POA: Diagnosis not present

## 2017-04-23 DIAGNOSIS — H6521 Chronic serous otitis media, right ear: Secondary | ICD-10-CM | POA: Diagnosis not present

## 2017-04-23 DIAGNOSIS — H6061 Unspecified chronic otitis externa, right ear: Secondary | ICD-10-CM | POA: Diagnosis not present

## 2017-04-23 DIAGNOSIS — J320 Chronic maxillary sinusitis: Secondary | ICD-10-CM | POA: Diagnosis not present

## 2017-04-30 ENCOUNTER — Ambulatory Visit
Admission: RE | Admit: 2017-04-30 | Discharge: 2017-04-30 | Disposition: A | Discharge status: Home / Self Care | Payer: BLUE CROSS/BLUE SHIELD | Source: Ambulatory Visit | Attending: Otolaryngology | Admitting: Otolaryngology

## 2017-04-30 ENCOUNTER — Other Ambulatory Visit: Payer: Self-pay | Admitting: Otolaryngology

## 2017-04-30 DIAGNOSIS — H6061 Unspecified chronic otitis externa, right ear: Secondary | ICD-10-CM | POA: Diagnosis not present

## 2017-04-30 DIAGNOSIS — J320 Chronic maxillary sinusitis: Secondary | ICD-10-CM

## 2017-04-30 DIAGNOSIS — J322 Chronic ethmoidal sinusitis: Secondary | ICD-10-CM

## 2017-04-30 DIAGNOSIS — H6521 Chronic serous otitis media, right ear: Secondary | ICD-10-CM | POA: Diagnosis not present

## 2017-05-27 ENCOUNTER — Other Ambulatory Visit (HOSPITAL_COMMUNITY): Payer: Self-pay | Admitting: Otolaryngology

## 2017-05-27 DIAGNOSIS — H6061 Unspecified chronic otitis externa, right ear: Secondary | ICD-10-CM

## 2017-05-27 DIAGNOSIS — H6691 Otitis media, unspecified, right ear: Secondary | ICD-10-CM | POA: Diagnosis not present

## 2017-05-27 DIAGNOSIS — H6121 Impacted cerumen, right ear: Secondary | ICD-10-CM | POA: Diagnosis not present

## 2017-05-28 ENCOUNTER — Ambulatory Visit (INDEPENDENT_AMBULATORY_CARE_PROVIDER_SITE_OTHER): Payer: BLUE CROSS/BLUE SHIELD

## 2017-05-28 DIAGNOSIS — H6691 Otitis media, unspecified, right ear: Secondary | ICD-10-CM

## 2017-05-28 DIAGNOSIS — H6061 Unspecified chronic otitis externa, right ear: Secondary | ICD-10-CM

## 2017-05-28 DIAGNOSIS — H938X1 Other specified disorders of right ear: Secondary | ICD-10-CM | POA: Diagnosis not present

## 2017-06-03 DIAGNOSIS — H6691 Otitis media, unspecified, right ear: Secondary | ICD-10-CM | POA: Diagnosis not present

## 2017-06-11 DIAGNOSIS — H93A1 Pulsatile tinnitus, right ear: Secondary | ICD-10-CM | POA: Diagnosis not present

## 2017-06-11 DIAGNOSIS — H90A31 Mixed conductive and sensorineural hearing loss, unilateral, right ear with restricted hearing on the contralateral side: Secondary | ICD-10-CM | POA: Diagnosis not present

## 2017-06-11 DIAGNOSIS — R2689 Other abnormalities of gait and mobility: Secondary | ICD-10-CM | POA: Diagnosis not present

## 2017-07-01 DIAGNOSIS — H9011 Conductive hearing loss, unilateral, right ear, with unrestricted hearing on the contralateral side: Secondary | ICD-10-CM | POA: Diagnosis not present

## 2017-07-01 DIAGNOSIS — H9071 Mixed conductive and sensorineural hearing loss, unilateral, right ear, with unrestricted hearing on the contralateral side: Secondary | ICD-10-CM | POA: Diagnosis not present

## 2017-07-01 DIAGNOSIS — F17210 Nicotine dependence, cigarettes, uncomplicated: Secondary | ICD-10-CM | POA: Diagnosis not present

## 2017-07-01 DIAGNOSIS — H902 Conductive hearing loss, unspecified: Secondary | ICD-10-CM | POA: Diagnosis not present

## 2017-07-13 HISTORY — PX: OTHER SURGICAL HISTORY: SHX169

## 2017-08-12 DIAGNOSIS — G932 Benign intracranial hypertension: Secondary | ICD-10-CM

## 2017-08-12 HISTORY — DX: Benign intracranial hypertension: G93.2

## 2017-08-20 DIAGNOSIS — Z79899 Other long term (current) drug therapy: Secondary | ICD-10-CM | POA: Diagnosis not present

## 2017-08-20 DIAGNOSIS — M26621 Arthralgia of right temporomandibular joint: Secondary | ICD-10-CM | POA: Diagnosis not present

## 2017-08-20 DIAGNOSIS — Z9629 Presence of other otological and audiological implants: Secondary | ICD-10-CM | POA: Diagnosis not present

## 2017-08-20 DIAGNOSIS — M26601 Right temporomandibular joint disorder, unspecified: Secondary | ICD-10-CM | POA: Diagnosis not present

## 2017-08-20 DIAGNOSIS — Z9889 Other specified postprocedural states: Secondary | ICD-10-CM | POA: Diagnosis not present

## 2017-08-20 DIAGNOSIS — H9190 Unspecified hearing loss, unspecified ear: Secondary | ICD-10-CM | POA: Diagnosis not present

## 2017-08-20 DIAGNOSIS — H6521 Chronic serous otitis media, right ear: Secondary | ICD-10-CM | POA: Diagnosis not present

## 2017-08-20 DIAGNOSIS — F17210 Nicotine dependence, cigarettes, uncomplicated: Secondary | ICD-10-CM | POA: Diagnosis not present

## 2017-08-20 DIAGNOSIS — H90A11 Conductive hearing loss, unilateral, right ear with restricted hearing on the contralateral side: Secondary | ICD-10-CM | POA: Diagnosis not present

## 2017-08-27 DIAGNOSIS — Z48810 Encounter for surgical aftercare following surgery on the sense organs: Secondary | ICD-10-CM | POA: Diagnosis not present

## 2017-08-27 DIAGNOSIS — Z9622 Myringotomy tube(s) status: Secondary | ICD-10-CM | POA: Diagnosis not present

## 2017-08-27 DIAGNOSIS — G932 Benign intracranial hypertension: Secondary | ICD-10-CM | POA: Diagnosis not present

## 2017-08-27 DIAGNOSIS — Z888 Allergy status to other drugs, medicaments and biological substances status: Secondary | ICD-10-CM | POA: Diagnosis not present

## 2017-08-27 DIAGNOSIS — F17210 Nicotine dependence, cigarettes, uncomplicated: Secondary | ICD-10-CM | POA: Diagnosis not present

## 2017-08-27 DIAGNOSIS — L299 Pruritus, unspecified: Secondary | ICD-10-CM | POA: Diagnosis not present

## 2017-08-27 DIAGNOSIS — H9211 Otorrhea, right ear: Secondary | ICD-10-CM | POA: Diagnosis not present

## 2017-08-27 DIAGNOSIS — H9071 Mixed conductive and sensorineural hearing loss, unilateral, right ear, with unrestricted hearing on the contralateral side: Secondary | ICD-10-CM | POA: Diagnosis not present

## 2017-09-09 DIAGNOSIS — G932 Benign intracranial hypertension: Secondary | ICD-10-CM | POA: Diagnosis not present

## 2017-09-11 DIAGNOSIS — G932 Benign intracranial hypertension: Secondary | ICD-10-CM | POA: Diagnosis not present

## 2017-09-11 DIAGNOSIS — H9071 Mixed conductive and sensorineural hearing loss, unilateral, right ear, with unrestricted hearing on the contralateral side: Secondary | ICD-10-CM | POA: Diagnosis not present

## 2017-09-11 DIAGNOSIS — H9211 Otorrhea, right ear: Secondary | ICD-10-CM | POA: Diagnosis not present

## 2017-09-11 DIAGNOSIS — G960 Cerebrospinal fluid leak: Secondary | ICD-10-CM | POA: Diagnosis not present

## 2017-09-23 DIAGNOSIS — G932 Benign intracranial hypertension: Secondary | ICD-10-CM | POA: Diagnosis not present

## 2017-09-30 DIAGNOSIS — H90A11 Conductive hearing loss, unilateral, right ear with restricted hearing on the contralateral side: Secondary | ICD-10-CM | POA: Diagnosis not present

## 2017-09-30 DIAGNOSIS — G960 Cerebrospinal fluid leak: Secondary | ICD-10-CM | POA: Diagnosis not present

## 2017-10-17 DIAGNOSIS — I871 Compression of vein: Secondary | ICD-10-CM | POA: Diagnosis not present

## 2017-10-17 DIAGNOSIS — G960 Cerebrospinal fluid leak: Secondary | ICD-10-CM | POA: Diagnosis not present

## 2017-10-17 DIAGNOSIS — G932 Benign intracranial hypertension: Secondary | ICD-10-CM | POA: Diagnosis not present

## 2017-10-18 DIAGNOSIS — G960 Cerebrospinal fluid leak: Secondary | ICD-10-CM | POA: Diagnosis not present

## 2017-10-18 DIAGNOSIS — G932 Benign intracranial hypertension: Secondary | ICD-10-CM | POA: Diagnosis not present

## 2017-10-18 DIAGNOSIS — I871 Compression of vein: Secondary | ICD-10-CM | POA: Diagnosis not present

## 2017-10-22 DIAGNOSIS — G960 Cerebrospinal fluid leak: Secondary | ICD-10-CM | POA: Diagnosis not present

## 2017-10-22 DIAGNOSIS — G932 Benign intracranial hypertension: Secondary | ICD-10-CM | POA: Diagnosis not present

## 2017-11-06 DIAGNOSIS — G932 Benign intracranial hypertension: Secondary | ICD-10-CM | POA: Diagnosis not present

## 2017-11-06 DIAGNOSIS — Z7982 Long term (current) use of aspirin: Secondary | ICD-10-CM | POA: Diagnosis not present

## 2017-11-06 DIAGNOSIS — Z7902 Long term (current) use of antithrombotics/antiplatelets: Secondary | ICD-10-CM | POA: Diagnosis not present

## 2017-11-06 DIAGNOSIS — R0602 Shortness of breath: Secondary | ICD-10-CM | POA: Diagnosis not present

## 2017-11-06 DIAGNOSIS — Z87891 Personal history of nicotine dependence: Secondary | ICD-10-CM | POA: Diagnosis not present

## 2017-11-29 DIAGNOSIS — Z87891 Personal history of nicotine dependence: Secondary | ICD-10-CM | POA: Diagnosis not present

## 2017-11-29 DIAGNOSIS — Z95828 Presence of other vascular implants and grafts: Secondary | ICD-10-CM | POA: Diagnosis not present

## 2017-11-29 DIAGNOSIS — Z7982 Long term (current) use of aspirin: Secondary | ICD-10-CM | POA: Diagnosis not present

## 2017-11-29 DIAGNOSIS — Z48811 Encounter for surgical aftercare following surgery on the nervous system: Secondary | ICD-10-CM | POA: Diagnosis not present

## 2017-12-10 DIAGNOSIS — Z7689 Persons encountering health services in other specified circumstances: Secondary | ICD-10-CM | POA: Diagnosis not present

## 2017-12-10 DIAGNOSIS — R350 Frequency of micturition: Secondary | ICD-10-CM | POA: Diagnosis not present

## 2018-07-01 ENCOUNTER — Other Ambulatory Visit: Payer: Self-pay

## 2018-07-01 ENCOUNTER — Emergency Department
Admission: EM | Admit: 2018-07-01 | Discharge: 2018-07-01 | Disposition: A | Discharge status: Home / Self Care | Payer: BLUE CROSS/BLUE SHIELD | Source: Home / Self Care | Attending: Family Medicine | Admitting: Family Medicine

## 2018-07-01 DIAGNOSIS — R350 Frequency of micturition: Secondary | ICD-10-CM

## 2018-07-01 DIAGNOSIS — R300 Dysuria: Secondary | ICD-10-CM

## 2018-07-01 DIAGNOSIS — R030 Elevated blood-pressure reading, without diagnosis of hypertension: Secondary | ICD-10-CM

## 2018-07-01 MED ORDER — CEPHALEXIN 500 MG PO CAPS: 500.0000 mg | ORAL_CAPSULE | Freq: Two times a day (BID) | ORAL | 0 refills | Status: DC

## 2018-07-01 NOTE — Discharge Instructions (Signed)
°  Please take your antibiotic as prescribed. A urine culture has been sent to check the severity of your urinary infection and to determine if you are on the most appropriate antibiotic. The results should come back within 2-3 days and you will be notified even if no medication change is needed.  Please stay well hydrated and follow up with your family doctor in 1 week if not improving, sooner if worsening.  

## 2018-07-01 NOTE — ED Triage Notes (Signed)
Started Sunday with burning, lower back pain and odor.

## 2018-07-01 NOTE — ED Provider Notes (Signed)
[facility]    CSN: [id number] Arrival date & time: 07/01/18  1459     History   Chief Complaint Chief Complaint  Patient presents with  . Dysuria  . Back Pain    HPI Morgan Maddox is a 57 y.o. female.   HPI  Morgan Maddox is a 57 y.o. female presenting to UC with c/o 2-3 days of urinary burning, frequency, urgency, malodorous urine, lower back soreness and bladder pressure. She has taken Azo with mild relief.  Denies fever, chills, n/v/d. Last UTI was "many years ago."  She did go swimming in a pool recently but denies vaginal discharge, itching or burning. No concern for STIs.    Past Medical History:  Diagnosis Date  . Bilateral ovarian cysts   . Family history of premature CAD    Brother had an MI, sister has CAD. Mother had an MI at age 80. Still alive at 74.  [name] History of mononucleosis 04/21/2013  . History of sinus surgery 04/21/2013  . Hypertriglyceridemia 04/21/2013  . Morbid obesity with BMI of 40.0-44.9, adult ([place])    BMI roughly 43 noted on 02/09/2016 admission [name])  . 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 Vitamin D deficiency     Patient Active Problem List   Diagnosis Date Noted  . Poor compliance with medication 10/08/2016  . 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 Prediabetes 04/30/2016  . Costochondritis 03/04/2016  . Elevated BP 03/23/2015  . Vitamin D deficiency 03/23/2015  . Medication management 03/23/2015  . Morbid obesity (BMI 43.44)  03/23/2015  . History of mononucleosis 04/21/2013  . 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 History of D&C 04/21/2013  . Mixed hyperlipidemia 04/21/2013    Past Surgical History:  Procedure Laterality Date  . NASAL SINUS SURGERY    . NM MYOVIEW LTD  02/09/2016   [place]: EF 76%. Periapical defect with partial improvement during rest. No wall motion abnormality noted. Likely related to breast attenuation, cannot exclude small area of periapical ischemia. --> Cardiac  catheterization not recommended. LOW RISK  . OVARIAN CYST SURGERY    . TONSILLECTOMY      OB History   None      Home Medications    Prior to Admission medications   Medication Sig Start Date End Date Taking? Authorizing Provider  aspirin 81 MG chewable tablet Chew 81 mg by mouth daily.    [provider]  buPROPion (WELLBUTRIN XL) 300 MG 24 hr tablet Take 1 tablet (300 mg total) by mouth every morning. 10/08/16 10/08/17  Unk Pinto, MD  cephALEXin (KEFLEX) 500 MG capsule Take 1 capsule (500 mg total) by mouth 2 (two) times daily. 07/01/18   Noe Gens, PA-C  Cholecalciferol (VITAMIN D3) 5000 UNIT/ML LIQD Take 2 mLs by mouth daily.    [provider]  Cyanocobalamin (VITAMIN B-12) 5000 MCG SUBL Place under the tongue daily.    [provider]  Lactobacillus (ACIDOPHILUS) CHEW Chew by mouth. 01/12/16   [provider]  meclizine (ANTIVERT) 12.5 MG tablet Take by mouth. 01/11/16   [provider]  metFORMIN (GLUCOPHAGE XR) 500 MG 24 hr tablet Take 3 to 4 tablets daily for insulin Resistance & weight loss Patient not taking: Reported on 10/08/2016 07/03/16 01/03/17  Unk Pinto, MD  mometasone (NASONEX) 50 MCG/ACT nasal spray Place into the nose.    [provider]  phentermine (ADIPEX-P) 37.5 MG tablet Take 1/2 to 1 tablet every morning for dieting &  weightloss Patient not taking: Reported on 10/08/2016 07/03/16   Unk Pinto, MD  promethazine-dextromethorphan (PROMETHAZINE-DM) 6.25-15 MG/5ML syrup Take 5 mLs by mouth every 6 (six) hours as needed for cough. 08/28/16   Unk Pinto, MD    Family History Family History  Problem Relation Age of Onset  . Heart failure Mother        Long-standing CAD  . Heart attack Mother        First MI in 20s  . Coronary artery disease Mother   . Coronary artery disease Sister 72  . Heart attack Brother        In his 26s  . Coronary artery disease Brother     Social  History Social History   Tobacco Use  . Smoking status: Heavy Tobacco Smoker    Packs/day: 0.50    Years: 25.00    Pack years: 12.50    Types: Cigarettes  Substance Use Topics  . Alcohol use: Yes    Alcohol/week: 0.0 standard drinks    Comment: social  . Drug use: No     Allergies   Benadryl [diphenhydramine hcl]; Caladryl [pramoxine-calamine]; and Calamine   Review of Systems Review of Systems  Constitutional: Negative for chills and fever.  Gastrointestinal: Negative for diarrhea, nausea and vomiting.  Genitourinary: Positive for dysuria, frequency and urgency. Negative for hematuria.  Musculoskeletal: Positive for back pain. Negative for myalgias.     Physical Exam Triage Vital Signs ED Triage Vitals  Enc Vitals Group     BP 07/01/18 1543 (!) 144/88     Pulse Rate 07/01/18 1543 99     Resp --      Temp 07/01/18 1543 98.3 F (36.8 C)     Temp Source 07/01/18 1543 Oral     SpO2 07/01/18 1543 94 %     Weight 07/01/18 1544 257 lb (116.6 kg)     Height 07/01/18 1544 5\' 7"  (1.702 m)     Head Circumference --      Peak Flow --      Pain Score 07/01/18 1544 4     Pain Loc --      Pain Edu? --      Excl. in [place]? --    No data found.  Updated Vital Signs BP 135/80 (BP Location: Right Arm)   Pulse 99   Temp 98.3 F (36.8 C) (Oral)   Ht 5\' 7"  (1.702 m)   Wt 257 lb (116.6 kg)   SpO2 94%   BMI 40.25 kg/m   Visual Acuity Right Eye Distance:   Left Eye Distance:   Bilateral Distance:    Right Eye Near:   Left Eye Near:    Bilateral Near:     Physical Exam  Constitutional: She is oriented to person, place, and time. She appears well-developed and well-nourished. No distress.  HENT:  Head: Normocephalic and atraumatic.  Eyes: EOM are normal.  Neck: Normal range of motion.  Cardiovascular: Normal rate, regular rhythm and normal heart sounds.  Pulmonary/Chest: Effort normal and breath sounds normal. No stridor. No respiratory distress. She has no wheezes.  She has no rales.  Abdominal: Soft. She exhibits no distension. There is no tenderness. There is no CVA tenderness.  Musculoskeletal: Normal range of motion.  Neurological: She is alert and oriented to person, place, and time.  Skin: Skin is warm and dry. She is not diaphoretic.  Psychiatric: She has a normal mood and affect. Her behavior is normal.  Nursing note and vitals  reviewed.    UC Treatments / Results  Labs (all labs ordered are listed, but only abnormal results are displayed) Labs Reviewed  URINE CULTURE    EKG None  Radiology No results found.  Procedures Procedures (including critical care time)  Medications Ordered in UC Medications - No data to display  Initial Impression / Assessment and Plan / UC Course  I have reviewed the triage vital signs and the nursing notes.  Pertinent labs & imaging results that were available during my care of the patient were reviewed by me and considered in my medical decision making (see chart for details).     Hx c/w UTI Unable to run UA due to pt taking Azo just PTA Urine culture sent Will start pt on empiric treatment with cephalexin  Home instructions provided. Final Clinical Impressions(s) / UC Diagnoses   Final diagnoses:  Dysuria  Urinary frequency  Elevated blood pressure reading     Discharge Instructions      Please take your antibiotic as prescribed. A urine culture has been sent to check the severity of your urinary infection and to determine if you are on the most appropriate antibiotic. The results should come back within 2-3 days and you will be notified even if no medication change is needed.  Please stay well hydrated and follow up with your family doctor in 1 week if not improving, sooner if worsening.     ED Prescriptions    Medication Sig Dispense Auth. Provider   cephALEXin (KEFLEX) 500 MG capsule  (Status: Discontinued) Take 1 capsule (500 mg total) by mouth 2 (two) times daily. 14 capsule  Gerarda Fraction, [place] [facility], PA-C   cephALEXin (KEFLEX) 500 MG capsule Take 1 capsule (500 mg total) by mouth 2 (two) times daily. 14 capsule Noe Gens, PA-C     Controlled Substance Prescriptions [place] Controlled Substance Registry consulted? Not Applicable   Tyrell Antonio 07/01/18 1553

## 2018-07-03 ENCOUNTER — Telehealth: Payer: Self-pay

## 2018-07-03 LAB — URINE CULTURE
MICRO NUMBER:: 90990918
SPECIMEN QUALITY:: ADEQUATE

## 2018-07-03 NOTE — Telephone Encounter (Signed)
Left VM with lab results and contact information for any questions.

## 2018-07-15 ENCOUNTER — Encounter: Payer: Self-pay | Admitting: Osteopathic Medicine

## 2018-07-15 ENCOUNTER — Ambulatory Visit (INDEPENDENT_AMBULATORY_CARE_PROVIDER_SITE_OTHER): Payer: BLUE CROSS/BLUE SHIELD | Admitting: Osteopathic Medicine

## 2018-07-15 DIAGNOSIS — Z8639 Personal history of other endocrine, nutritional and metabolic disease: Secondary | ICD-10-CM | POA: Diagnosis not present

## 2018-07-15 DIAGNOSIS — Z1231 Encounter for screening mammogram for malignant neoplasm of breast: Secondary | ICD-10-CM

## 2018-07-15 DIAGNOSIS — R7303 Prediabetes: Secondary | ICD-10-CM | POA: Diagnosis not present

## 2018-07-15 DIAGNOSIS — E559 Vitamin D deficiency, unspecified: Secondary | ICD-10-CM | POA: Diagnosis not present

## 2018-07-15 DIAGNOSIS — Z1239 Encounter for other screening for malignant neoplasm of breast: Secondary | ICD-10-CM

## 2018-07-15 DIAGNOSIS — L578 Other skin changes due to chronic exposure to nonionizing radiation: Secondary | ICD-10-CM

## 2018-07-15 DIAGNOSIS — R21 Rash and other nonspecific skin eruption: Secondary | ICD-10-CM

## 2018-07-15 DIAGNOSIS — E782 Mixed hyperlipidemia: Secondary | ICD-10-CM | POA: Diagnosis not present

## 2018-07-15 DIAGNOSIS — Z1211 Encounter for screening for malignant neoplasm of colon: Secondary | ICD-10-CM

## 2018-07-15 DIAGNOSIS — Z0000 Encounter for general adult medical examination without abnormal findings: Secondary | ICD-10-CM

## 2018-07-15 NOTE — Patient Instructions (Signed)
Plan: Let's have you back for annual physical sometime not too long after your birthday  Will get labs, mammogram, Cologuard screening in the meantime Think about shingles vaccine

## 2018-07-15 NOTE — Progress Notes (Signed)
HPI: Morgan Maddox is a 57 y.o. 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  she presents to [facility] today, 07/15/18,  for chief complaint of: New to establish See headings    "Brain stent" - R transverse sinus stenting - following with neurosurgery at [facility]. Hx IIH, hx CSF leak from ear.   Spots on her face - she'd like referral to dermatology.   Former smoker, quit 10/16/17.   Postmenopausal, LMP few years ago. One child, no problems w/ pregnancy. Not currently sexually active.    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   Hx prediabetes, working on weight loss.     Past medical, surgical, social and family history reviewed:  Patient Active Problem List   Diagnosis Date Noted  . Poor compliance with medication 10/08/2016  . 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 Prediabetes 04/30/2016  . Costochondritis 03/04/2016  . Elevated BP 03/23/2015  . Vitamin D deficiency 03/23/2015  . Medication management 03/23/2015  . Morbid obesity (BMI 43.44)  03/23/2015  . History of mononucleosis 04/21/2013  . 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 History of D&C 04/21/2013  . Mixed hyperlipidemia 04/21/2013    Past Surgical History:  Procedure Laterality Date  . NASAL SINUS SURGERY    . NM MYOVIEW LTD  02/09/2016   [place]: EF 76%. Periapical defect with partial improvement during rest. No wall motion abnormality noted. Likely related to breast attenuation, cannot exclude small area of periapical ischemia. --> Cardiac catheterization not recommended. LOW  RISK  . OVARIAN CYST SURGERY    . TONSILLECTOMY      Social History   Tobacco Use  . Smoking status: Heavy Tobacco Smoker    Packs/day: 0.50    Years: 25.00    Pack years: 12.50    Types: Cigarettes  Substance Use Topics  . Alcohol use: Yes    Alcohol/week: 0.0 standard drinks    Comment: social    Family History  Problem Relation Age of Onset  . Heart failure Mother        Long-standing CAD  . Heart attack Mother        First MI in 49s  . Coronary artery disease Mother   . Coronary artery disease Sister 5  . Heart attack Brother        In his 32s  . Coronary artery disease Brother      Current medication list and allergy/intolerance information reviewed:    Current Outpatient Medications  Medication Sig Dispense Refill  . aspirin 81 MG chewable tablet Chew 81 mg by mouth daily.    [name] buPROPion (WELLBUTRIN XL) 300 MG 24 hr tablet Take 1 tablet (300 mg total) by mouth every morning. 30 tablet 5  . cephALEXin (KEFLEX) 500 MG capsule Take 1 capsule (500 mg total) by mouth 2 (two) times daily. 14 capsule 0  . Cholecalciferol (VITAMIN D3) 5000 UNIT/ML LIQD Take 2 mLs by mouth daily.    . Cyanocobalamin (VITAMIN B-12) 5000 MCG SUBL Place under the tongue daily.    . Lactobacillus (ACIDOPHILUS) CHEW Chew by mouth.    . meclizine (ANTIVERT) 12.5 MG tablet Take  by mouth.    . metFORMIN (GLUCOPHAGE XR) 500 MG 24 hr tablet Take 3 to 4 tablets daily for insulin Resistance & weight loss (Patient not taking: Reported on 10/08/2016) 360 tablet 1  . mometasone (NASONEX) 50 MCG/ACT nasal spray Place into the nose.    . phentermine (ADIPEX-P) 37.5 MG tablet Take 1/2 to 1 tablet every morning for dieting & weightloss (Patient not taking: Reported on 10/08/2016) 30 tablet 2  . promethazine-dextromethorphan (PROMETHAZINE-DM) 6.25-15 MG/5ML syrup Take 5 mLs by mouth every 6 (six) hours as needed for cough. 240 mL 1   No current facility-administered medications for this visit.      Allergies  Allergen Reactions  . Benadryl [Diphenhydramine Hcl]   . Caladryl [Pramoxine-Calamine]   . Calamine       Review of Systems:  Constitutional:  No  fever, no chills, No recent illness, No unintentional weight changes. No significant fatigue.   HEENT: No  headache, no vision change, no hearing change, No sore throat, No  sinus pressure  Cardiac: No  chest pain, No  pressure, No palpitations, No  Orthopnea  Respiratory:  No  shortness of breath. No  Cough  Gastrointestinal: No  abdominal pain, No  nausea, No  vomiting,  No  blood in stool, No  diarrhea, No  constipation   Musculoskeletal: No new myalgia/arthralgia  Skin: No  Rash, No other wounds/concerning lesions  Genitourinary: No  incontinence, No  abnormal genital bleeding, No abnormal genital discharge  Hem/Onc: No  easy bruising/bleeding, No  abnormal lymph node  Endocrine: No cold intolerance,  No heat intolerance. No polyuria/polydipsia/polyphagia   Neurologic: No  weakness, No  dizziness, No  slurred speech/focal weakness/facial droop  Psychiatric: No  concerns with depression, No  concerns with anxiety, No sleep problems, No mood problems  Exam:  BP 114/81 (BP Location: Left Arm, Patient Position: Sitting, Cuff Size: Large)   Pulse 76   Temp 98.2 F (36.8 C) (Oral)   Wt 249 lb 12.8 oz (113.3 kg)   BMI 39.12 kg/m   Constitutional: VS see above. General Appearance: alert, well-developed, well-nourished, NAD  Eyes: Normal lids and conjunctive, non-icteric sclera  Ears, Nose, Mouth, Throat: MMM, Normal external inspection ears/nares/mouth/lips/gums. TM normal bilaterally.   Neck: No masses, trachea midline. No thyroid enlargement.  Respiratory: Normal respiratory effort. no wheeze, no rhonchi, no rales  Cardiovascular: S1/S2 normal, no murmur, no rub/gallop auscultated. RRR. No lower extremity edema.  Gastrointestinal: Nontender, no masses.   Musculoskeletal: Gait normal. No  clubbing/cyanosis of digits.   Neurological: Normal balance/coordination. No tremor. No cranial nerve deficit on limited exam. Motor and sensation intact and symmetric. Cerebellar reflexes intact.   Skin: warm, dry, intact.   Psychiatric: Normal judgment/insight. Normal mood and affect. Oriented x3.    No results found for this or any previous visit (from the past 72 hour(s)).  No results found.   ASSESSMENT/PLAN: Diagnoses of Mixed hyperlipidemia, Prediabetes, Vitamin D deficiency, History of non anemic vitamin B12 deficiency, Screening for malignant neoplasm of colon, Screening for breast cancer, Rash and nonspecific skin eruption, Sun-damaged skin  Orders Placed This Encounter  Procedures  . MM 3D SCREEN BREAST BILATERAL  . VITAMIN D 25 Hydroxy (Vit-D Deficiency, Fractures)  . Cologuard  . B12  . TSH  . Lipid Profile  . HgB A1c  . COMPLETE METABOLIC PANEL WITH GFR  . CBC  . Lipid panel  . COMPLETE METABOLIC PANEL WITH GFR  . Hemoglobin A1c  . TSH  .  CBC  . Ambulatory referral to Dermatology     Patient Instructions  Plan: Let's have you back for annual physical sometime not too long after your birthday  Will get labs, mammogram, Cologuard screening in the meantime Think about shingles vaccine       Visit summary with medication list and pertinent instructions was printed for patient to review. All questions at time of visit were answered - patient instructed to contact office with any additional concerns. ER/RTC precautions were reviewed with the patient.   Follow-up plan: Return for annual physical later this year! Otherwise as needed .  Note: Total time spent 45 minutes, greater than 50% of the visit was spent face-to-face counseling and coordinating care for the above diagnoses in A/P  Please note: voice recognition software was used to produce this document, and typos may escape review. Please contact Dr. Sheppard Coil for any needed clarifications.

## 2018-07-16 ENCOUNTER — Encounter: Payer: Self-pay | Admitting: Osteopathic Medicine

## 2018-07-16 LAB — LIPID PANEL
Cholesterol: 196 mg/dL (ref <200)
HDL: 70 mg/dL (ref >50)
LDL CHOLESTEROL (CALC): 104 mg/dL — AB
Non-HDL Cholesterol (Calc): 126 mg/dL (calc) (ref <130)
TRIGLYCERIDES: 121 mg/dL (ref <150)
Total CHOL/HDL Ratio: 2.8 (calc) (ref <5.0)

## 2018-07-16 LAB — COMPLETE METABOLIC PANEL WITH GFR
AG RATIO: 1.6 (calc) (ref 1.0–2.5)
ALBUMIN MSPROF: 4.3 g/dL (ref 3.6–5.1)
ALT: 17 U/L (ref 6–29)
AST: 13 U/L (ref 10–35)
Alkaline phosphatase (APISO): 82 U/L (ref 33–130)
BILIRUBIN TOTAL: 0.6 mg/dL (ref 0.2–1.2)
BUN: 11 mg/dL (ref 7–25)
CHLORIDE: 101 mmol/L (ref 98–110)
CO2: 32 mmol/L (ref 20–32)
Calcium: 10.1 mg/dL (ref 8.6–10.4)
Creat: 0.86 mg/dL (ref 0.50–1.05)
GFR, EST AFRICAN AMERICAN: 88 mL/min/{1.73_m2} (ref >=60)
GFR, Est Non African American: 76 mL/min/{1.73_m2} (ref >=60)
GLOBULIN: 2.7 g/dL (ref 1.9–3.7)
GLUCOSE: 95 mg/dL (ref 65–99)
POTASSIUM: 5.4 mmol/L — AB (ref 3.5–5.3)
Sodium: 142 mmol/L (ref 135–146)
Total Protein: 7 g/dL (ref 6.1–8.1)

## 2018-07-16 LAB — HEMOGLOBIN A1C
EAG (MMOL/L): 6.3 (calc)
HEMOGLOBIN A1C: 5.6 %{Hb} (ref <5.7)
MEAN PLASMA GLUCOSE: 114 (calc)

## 2018-07-16 LAB — CBC
HCT: 41.2 % (ref 35.0–45.0)
Hemoglobin: 13.6 g/dL (ref 11.7–15.5)
MCH: 28.2 pg (ref 27.0–33.0)
MCHC: 33 g/dL (ref 32.0–36.0)
MCV: 85.5 fL (ref 80.0–100.0)
MPV: 10 fL (ref 7.5–12.5)
PLATELETS: 365 10*3/uL (ref 140–400)
RBC: 4.82 10*6/uL (ref 3.80–5.10)
RDW: 12.8 % (ref 11.0–15.0)
WBC: 7.5 10*3/uL (ref 3.8–10.8)

## 2018-07-16 LAB — VITAMIN D 25 HYDROXY (VIT D DEFICIENCY, FRACTURES): Vit D, 25-Hydroxy: 48 ng/mL (ref 30–100)

## 2018-07-16 LAB — TSH: TSH: 1.85 mIU/L

## 2018-07-25 ENCOUNTER — Ambulatory Visit (INDEPENDENT_AMBULATORY_CARE_PROVIDER_SITE_OTHER): Payer: BLUE CROSS/BLUE SHIELD

## 2018-07-25 ENCOUNTER — Ambulatory Visit: Payer: BLUE CROSS/BLUE SHIELD

## 2018-07-25 DIAGNOSIS — Z1231 Encounter for screening mammogram for malignant neoplasm of breast: Secondary | ICD-10-CM

## 2018-09-05 ENCOUNTER — Encounter: Payer: Self-pay | Admitting: Osteopathic Medicine

## 2018-09-05 ENCOUNTER — Ambulatory Visit (INDEPENDENT_AMBULATORY_CARE_PROVIDER_SITE_OTHER): Payer: BLUE CROSS/BLUE SHIELD | Admitting: Osteopathic Medicine

## 2018-09-05 ENCOUNTER — Ambulatory Visit (INDEPENDENT_AMBULATORY_CARE_PROVIDER_SITE_OTHER): Payer: BLUE CROSS/BLUE SHIELD

## 2018-09-05 VITALS — BP 140/60 | HR 75 | Temp 98.2°F | Wt 240.8 lb

## 2018-09-05 DIAGNOSIS — R220 Localized swelling, mass and lump, head: Secondary | ICD-10-CM | POA: Diagnosis not present

## 2018-09-05 DIAGNOSIS — J0100 Acute maxillary sinusitis, unspecified: Secondary | ICD-10-CM | POA: Diagnosis not present

## 2018-09-05 DIAGNOSIS — R51 Headache: Secondary | ICD-10-CM

## 2018-09-05 DIAGNOSIS — H6981 Other specified disorders of Eustachian tube, right ear: Secondary | ICD-10-CM

## 2018-09-05 MED ORDER — IPRATROPIUM BROMIDE 0.06 % NA SOLN: 2.0000 | Freq: Four times a day (QID) | NASAL | 1 refills | Status: DC | PRN

## 2018-09-05 MED ORDER — LEVOFLOXACIN 500 MG PO TABS: 500.0000 mg | ORAL_TABLET | Freq: Every day | ORAL | 0 refills | Status: DC

## 2018-09-05 NOTE — Patient Instructions (Signed)
Plan: Antibiotics and nasal spray. CT sinuses today.  To ER if worse over the weekend!  Will reach out to your neurosurgeon and if they have anything to add will let you know!

## 2018-09-05 NOTE — Progress Notes (Addendum)
HPI: Morgan Maddox is a 57 y.o. 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  she presents to [facility] today, 09/05/18,  for chief complaint of:  Facial pain  R cheek/eye pain 6+ weeks. Worse past 5 days. Has felt like a cold. Sore throat. Dry nasal passages.   Was on phone w/ her dentist who told her to come here, no dental pain Hx stenting R Transverse Sinus (brain) and pt is worried about this.     Past medical history, surgical history, and family history reviewed.  Current medication list and allergy/intolerance information reviewed.   (See remainder of HPI, ROS, Phys Exam below)   ASSESSMENT/PLAN:   Subacute maxillary sinusitis - seems more likely than complication from previous surgeries, I reached out to neurosurgery for any further recs. Signed out to covering providers.  Localized swelling, mass, and lump of head - hx R tansverse sinus (cerebral) stent and hx CSF leak from ear - Plan: CT Maxillofacial WO CM  Dysfunction of right eustachian tube   Meds ordered this encounter  Medications  . levofloxacin (LEVAQUIN) 500 MG tablet    Sig: Take 1 tablet (500 mg total) by mouth daily.    Dispense:  7 tablet    Refill:  0  . ipratropium (ATROVENT) 0.06 % nasal spray    Sig: Place 2 sprays into both nostrils 4 (four) times daily as needed for rhinitis.    Dispense:  15 mL    Refill:  1    Patient Instructions  Plan: Antibiotics and nasal spray. CT sinuses today.  To ER if worse over the weekend!  Will reach out to your neurosurgeon and if they have anything to add will let you know!   Follow-up plan: Return if symptoms worsen or fail to  improve.    Left message for neurosurgery: Dr Bridgette Habermann [phone] 09/05/18 11:39 AM  Received a call back from his nurse practitioner, today at 12:13 - nothing to add. Will forward CT resutls to their office.    Ct Maxillofacial Wo Cm  Result Date: 09/05/2018 CLINICAL DATA:  57 year old female with right side head and facial swelling beginning this week. EXAM: CT MAXILLOFACIAL WITHOUT CONTRAST TECHNIQUE: Multidetector CT imaging of the maxillofacial structures was performed. Multiplanar CT image reconstructions were also generated. COMPARISON:  Temporal bone CT 05/28/2017. Skull radiographs 04/30/2017. FINDINGS: Osseous: Mandible intact. No acute dental finding identified. Maxilla, zygoma, and nasal bones intact. Central skull base and visible cervical vertebrae appear intact. Orbits: Orbital walls intact. Visualized orbit soft tissues are within normal limits. Sinuses: Prior maxillary antrostomies. Similar bilateral maxillary alveolar recess mucosal thickening. Other paranasal sinuses are stable and well pneumatized. Persistent globular opacity at the junction of the right tympanic cavity and mastoid antrum, but better pneumatized right tympanic cavity elsewhere. Improved aeration about the ossicles. Partial opacification of the visible mastoid air cells appear stable. Left tympanic cavity and mastoids remain clear. Soft tissues: Incidental retropharyngeal course of the right carotid artery on series 2, image 17. The visible noncontrast deep soft tissue spaces of the face appear negative. No upper cervical lymphadenopathy. No superficial soft tissue inflammation or abnormality identified. Limited intracranial: Negative visible noncontrast brain parenchyma. IMPRESSION: 1. Negative noncontrast face CT; no inflammatory process identified. 2. Improved right  tympanic cavity pneumatization since 2018. Persistent globular opacity at the junction of the right tympanic and mastoid antrum. Visible partial right  mastoid opacification appears stable. Electronically Signed   By: Genevie Ann M.D.   On: 09/05/2018 12:46                                 ############################################ ############################################ ############################################ ############################################    Outpatient Encounter Medications as of 09/05/2018  Medication Sig Note  . aspirin 81 MG chewable tablet Chew 81 mg by mouth daily. 03/01/2016: Received from: [place]: Chew by mouth.  [name] buPROPion (WELLBUTRIN XL) 300 MG 24 hr tablet Take 1 tablet (300 mg total) by mouth every morning.   . Cholecalciferol (VITAMIN D3) 5000 UNIT/ML LIQD Take 2 mLs by mouth daily. 07/03/2016: Takes 10,000 units  . Cyanocobalamin (VITAMIN B-12) 5000 MCG SUBL Place under the tongue daily.   . Lactobacillus (ACIDOPHILUS) CHEW Chew by mouth. 06/05/2016: Received from: [place]: Chew 1 tablet by mouth 2 (two) times a day as needed.  . meclizine (ANTIVERT) 12.5 MG tablet Take by mouth. 10/08/2016: Received from: [place]: Take 1 tablet by mouth every 6 (six) hours as needed.  . metFORMIN (GLUCOPHAGE XR) 500 MG 24 hr tablet Take 3 to 4 tablets daily for insulin Resistance & weight loss (Patient not taking: Reported on 10/08/2016)   . mometasone (NASONEX) 50 MCG/ACT nasal spray Place into the nose. 10/08/2016: Received from: [place]: 2 sprays by Nasal route daily.  . phentermine (ADIPEX-P) 37.5 MG tablet Take 1/2 to 1 tablet every morning for dieting & weightloss (Patient not taking: Reported on 10/08/2016)   . promethazine-dextromethorphan (PROMETHAZINE-DM) 6.25-15 MG/5ML syrup Take 5 mLs by mouth every 6 (six) hours as needed for cough. (Patient not taking: Reported on 07/15/2018)    No facility-administered encounter medications on file as of 09/05/2018.    Allergies  Allergen Reactions  . Calamine Rash  .  Pramoxine-Calamine Rash  . Diphenhydramine Hcl Rash      Review of Systems:  Constitutional: +recent illness  HEENT: +headache, no vision change, +sore throat, +maxillary sinus tenderness on R   Cardiac: No  chest pain, No  pressure, No palpitations  Respiratory:  No  shortness of breath. No  Cough  Gastrointestinal: No  abdominal pain, no change on bowel habits  Musculoskeletal: No new myalgia/arthralgia  Skin: No  Rash  Hem/Onc: No  easy bruising/bleeding, No  abnormal lumps/bumps  Neurologic: No  weakness, No  Dizziness  Psychiatric: No  concerns with depression, No  concerns with anxiety  Exam:  BP 140/60 (BP Location: Left Arm, Patient Position: Sitting, Cuff Size: Large)   Pulse 75   Temp 98.2 F (36.8 C) (Oral)   Wt 240 lb 12.8 oz (109.2 kg)   BMI 37.71 kg/m   Constitutional: VS see above. General Appearance: alert, well-developed, well-nourished, NAD  Eyes: Normal lids and conjunctive, non-icteric sclera  Ears, Nose, Mouth, Throat: MMM, Normal external inspection ears/nares/mouth/lips/gums.  Neck: No masses, trachea midline. (+)Lymphadenopathy - tenderness but no enlargement, at R Ant Cervical node, (+)tenderness R maxillary sinus, R TM normal w/ scar tissue and mild clear effusion, no evidence of OM. L TM normal w/ mild clear effusion.   Respiratory: Normal respiratory effort. no wheeze, no rhonchi, no rales  Cardiovascular: S1/S2 normal, no murmur, no rub/gallop auscultated. RRR.   Musculoskeletal: Gait normal. Symmetric and independent  movement of all extremities  Neurological: Normal balance/coordination. No tremor.  Skin: warm, dry, intact.   Psychiatric: Normal judgment/insight. Anxious mood and affect. Oriented x3.   Visit summary with medication list and pertinent instructions was printed for patient to review, advised to alert [place] if any changes needed. All questions at time of visit were answered - patient instructed to contact office with any  additional concerns. ER/RTC precautions were reviewed with the patient and understanding verbalized.   Follow-up plan: Return if symptoms worsen or fail to improve.  Note: Total time spent 40 minutes, greater than 50% of the visit was spent face-to-face counseling and coordinating care for the following: The primary encounter diagnosis was Localized swelling, mass, and lump of head. Diagnoses of Subacute maxillary sinusitis and Dysfunction of right eustachian tube were also pertinent to this visit.[name]  Please note: voice recognition software was used to produce this document, and typos may escape review. Please contact Dr. Sheppard Coil for any needed clarifications.

## 2018-09-10 ENCOUNTER — Telehealth: Payer: Self-pay

## 2018-09-10 NOTE — Telephone Encounter (Signed)
Sharyn Lull from [facility] left a vm msg. She needs clarification on referral sent for pt. Pls contact her at [phone]. Thanks.

## 2018-09-12 NOTE — Telephone Encounter (Signed)
I don't see a referral for Neurology in here - CF

## 2018-09-22 ENCOUNTER — Ambulatory Visit (INDEPENDENT_AMBULATORY_CARE_PROVIDER_SITE_OTHER): Payer: BLUE CROSS/BLUE SHIELD | Admitting: Osteopathic Medicine

## 2018-09-22 ENCOUNTER — Encounter: Payer: Self-pay | Admitting: Osteopathic Medicine

## 2018-09-22 ENCOUNTER — Other Ambulatory Visit (HOSPITAL_COMMUNITY)
Admission: RE | Admit: 2018-09-22 | Discharge: 2018-09-22 | Disposition: A | Discharge status: Home / Self Care | Payer: BLUE CROSS/BLUE SHIELD | Source: Ambulatory Visit | Attending: Osteopathic Medicine | Admitting: Osteopathic Medicine

## 2018-09-22 VITALS — BP 136/86 | HR 74 | Temp 98.1°F | Wt 240.6 lb

## 2018-09-22 DIAGNOSIS — Z124 Encounter for screening for malignant neoplasm of cervix: Secondary | ICD-10-CM

## 2018-09-22 DIAGNOSIS — R030 Elevated blood-pressure reading, without diagnosis of hypertension: Secondary | ICD-10-CM | POA: Diagnosis not present

## 2018-09-22 DIAGNOSIS — J029 Acute pharyngitis, unspecified: Secondary | ICD-10-CM

## 2018-09-22 LAB — POCT RAPID STREP A (OFFICE)

## 2018-09-22 MED ORDER — LIDOCAINE VISCOUS HCL 2 % MT SOLN: 5.0000 mL | OROMUCOSAL | 1 refills | Status: DC | PRN

## 2018-09-22 NOTE — Telephone Encounter (Signed)
She already has a neurosurgeon and was instructed to follow-up with them.

## 2018-09-22 NOTE — Patient Instructions (Addendum)
Over-the-Counter Medications & Home Remedies for Upper Respiratory Illness  Note: the following list assumes no pregnancy, normal liver & kidney function and no other drug interactions. Always ask a pharmacist or qualified medical provider if you have any questions!   Aches/Pains, Fever, Headache Acetaminophen (Tylenol) 500 mg tablets - take max 2 tablets (1000 mg) every 6 hours (4 times per day)  Ibuprofen (Motrin) 200 mg tablets - take max 4 tablets (800 mg) every 6 hours*  Sinus Congestion Nasal Saline if desired to rinse Oxymetolazone (Afrin, others) sparing use due to rebound congestion, NEVER use in kids Phenylephrine (Sudafed) 10 mg tablets every 4 hours (or the 12-hour formulation)* Diphenhydramine (Benadryl) 25 mg tablets - take max 2 tablets every 4 hours  Cough & Sore Throat Dextromethorphan (Robitussin, others) - cough suppressant Guaifenesin (Robitussin, Mucinex, others) - expectorant (helps cough up mucus) (Dextromethorphan and Guaifenesin also come in a combination tablet) Lozenges w/ Benzocaine + Menthol (Cepacol) Honey - as much as you want! Teas which "coat the throat" - look for ingredients Elm Bark, Licorice Root, Marshmallow Root  Other  Zinc Lozenges within 24 hours of symptoms onset - mixed evidence this shortens the duration of the common cold Don't waste your money on Vitamin C or Echinacea  *Caution in patients with high blood pressure

## 2018-09-22 NOTE — Progress Notes (Signed)
HPI: Morgan Maddox is a 57 y.o. 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  she presents to [facility] today, 09/22/18,  for chief complaint of:  Pap Sore throat   Been taking DayQuil and NyQuil, ears have been clogged. Feeling a bit better today. Reports some sore throat and soreness w/ swallowing. Ongoing 4 days.   Concerned about her blood pressure.  Today is borderline.  BP Readings from Last 3 Encounters:  09/05/18 140/60  07/15/18 114/81  07/01/18 135/80    Pap due. Declines STI screening.  Mammo in 07/2018 was BIRADS1    Past medical history, surgical history, and family history reviewed.  Current medication list and allergy/intolerance information reviewed.   (See remainder of HPI, ROS, Phys Exam below)  No results found.  No results found for this or any previous visit (from the past 72 hour(s)).      ASSESSMENT/PLAN:   Cervical cancer screening - Plan: Cytology - PAP  Elevated blood pressure reading  Pharyngitis, unspecified etiology - Plan: POCT rapid strep A, Culture, Group A Strep   Meds ordered this encounter  Medications  . lidocaine (XYLOCAINE) 2 % solution    Sig: Use as directed 5-10 mLs in the mouth or throat every 3 (three) hours as needed (mouth/throat pain).    Dispense:  100 mL    Refill:  1    Patient Instructions  Over-the-Counter Medications & Home Remedies for Upper Respiratory Illness  Note: the following list assumes no pregnancy, normal liver & kidney function and no other drug interactions. Always ask a pharmacist or qualified medical provider if you have any questions!   Aches/Pains, Fever, Headache Acetaminophen  (Tylenol) 500 mg tablets - take max 2 tablets (1000 mg) every 6 hours (4 times per day)  Ibuprofen (Motrin) 200 mg tablets - take max 4 tablets (800 mg) every 6 hours*  Sinus Congestion Nasal Saline if desired to rinse Oxymetolazone (Afrin, others) sparing use due to rebound congestion, NEVER use in kids Phenylephrine (Sudafed) 10 mg tablets every 4 hours (or the 12-hour formulation)* Diphenhydramine (Benadryl) 25 mg tablets - take max 2 tablets every 4 hours  Cough & Sore Throat Dextromethorphan (Robitussin, others) - cough suppressant Guaifenesin (Robitussin, Mucinex, others) - expectorant (helps cough up mucus) (Dextromethorphan and Guaifenesin also come in a combination tablet) Lozenges w/ Benzocaine + Menthol (Cepacol) Honey - as much as you want! Teas which "coat the throat" - look for ingredients Elm Bark, Licorice Root, Marshmallow Root  Other  Zinc Lozenges within 24 hours of symptoms onset - mixed evidence this shortens the duration of the common cold Don't waste your money on Vitamin C or Echinacea  *Caution in patients with high blood pressure      Follow-up plan: Return for Nurse visit to verify home blood pressure monitor..                      ############################################ ############################################ ############################################ ############################################    Outpatient Encounter Medications as of 09/22/2018  Medication Sig Note  . aspirin 81 MG chewable tablet Chew 81 mg by mouth daily. 03/01/2016: Received from: [place]: Chew by mouth.  . Cyanocobalamin (VITAMIN B-12) 5000 MCG SUBL Place under the tongue daily.   [name]  Lactobacillus (ACIDOPHILUS) CHEW Chew by mouth. 06/05/2016: Received from: [place]: Chew 1 tablet by mouth 2 (two) times a day as needed.  [name] levofloxacin (LEVAQUIN) 500 MG tablet Take 1 tablet (500 mg total) by mouth daily.   .  mometasone (NASONEX) 50 MCG/ACT nasal spray Place into the nose. 10/08/2016: Received from: [place]: 2 sprays by Nasal route daily.  . [DISCONTINUED] buPROPion (WELLBUTRIN XL) 300 MG 24 hr tablet Take 1 tablet (300 mg total) by mouth every morning.   . [DISCONTINUED] Cholecalciferol (VITAMIN D3) 5000 UNIT/ML LIQD Take 2 mLs by mouth daily. 07/03/2016: Takes 10,000 units  . [DISCONTINUED] ipratropium (ATROVENT) 0.06 % nasal spray Place 2 sprays into both nostrils 4 (four) times daily as needed for rhinitis.   . [DISCONTINUED] meclizine (ANTIVERT) 12.5 MG tablet Take by mouth. 10/08/2016: Received from: [place]: Take 1 tablet by mouth every 6 (six) hours as needed.  . [DISCONTINUED] metFORMIN (GLUCOPHAGE XR) 500 MG 24 hr tablet Take 3 to 4 tablets daily for insulin Resistance & weight loss (Patient not taking: Reported on 10/08/2016)   . [DISCONTINUED] phentermine (ADIPEX-P) 37.5 MG tablet Take 1/2 to 1 tablet every morning for dieting & weightloss (Patient not taking: Reported on 10/08/2016)   . [DISCONTINUED] promethazine-dextromethorphan (PROMETHAZINE-DM) 6.25-15 MG/5ML syrup Take 5 mLs by mouth every 6 (six) hours as needed for cough. (Patient not taking: Reported on 07/15/2018)    No facility-administered encounter medications on file as of 09/22/2018.    Allergies  Allergen Reactions  . Calamine Rash  . Pramoxine-Calamine Rash and Other (See Comments)  . Diphenhydramine Hcl Rash      Review of Systems:  Constitutional: +recent illness,no fever/chills   HEENT: No  headache, no vision change, +sore throat as per HPI  Cardiac: No  chest pain, No  pressure, No palpitations  Respiratory:  No  shortness of breath. +Cough  Gastrointestinal: No  abdominal pain, no change on bowel habits  Musculoskeletal: No new myalgia/arthralgia  Skin: No  Rash  Hem/Onc: No  easy bruising/bleeding, No  abnormal lumps/bumps  Neurologic: No  weakness, No   Dizziness  Psychiatric: No  concerns with depression, No  concerns with anxiety  Exam:  BP 136/86 (BP Location: Left Arm, Patient Position: Sitting, Cuff Size: Normal)   Pulse 74   Temp 98.1 F (36.7 C) (Oral)   Wt 240 lb 9.6 oz (109.1 kg)   BMI 37.68 kg/m   Constitutional: VS see above. General Appearance: alert, well-developed, well-nourished, NAD  Eyes: Normal lids and conjunctive, non-icteric sclera  Ears, Nose, Mouth, Throat: MMM, Normal external inspection ears/nares/mouth/lips/gums.  Neck: No masses, trachea midline.   Respiratory: Normal respiratory effort. no wheeze, no rhonchi, no rales  Cardiovascular: S1/S2 normal, no murmur, no rub/gallop auscultated. RRR.   Musculoskeletal: Gait normal. Symmetric and independent movement of all extremities  Neurological: Normal balance/coordination. No tremor.  Skin: warm, dry, intact.   Psychiatric: Normal judgment/insight. Normal mood and affect. Oriented x3.  GYN: No lesions/ulcers to external genitalia, normal urethra, atrophic but otherwise normal vaginal mucosa, physiologic discharge, cervix normal without lesions, uterus not enlarged or tender, adnexa no masses and nontender   Visit summary with medication list and pertinent instructions was printed for patient to review, advised to alert [place] if any changes needed. All questions at time of visit were answered - patient instructed to contact office with any additional concerns. ER/RTC precautions were reviewed with the patient and understanding verbalized.   Follow-up plan:  Return for Nurse visit to verify home blood pressure monitor..  Note: Total time spent 25 minutes, greater than 50% of the visit was spent face-to-face counseling and coordinating care for the following: The primary encounter diagnosis was Cervical cancer screening. Diagnoses of Elevated blood pressure reading and Pharyngitis, unspecified etiology were also pertinent to this visit.[name]  Please note: voice  recognition software was used to produce this document, and typos may escape review. Please contact Dr. Sheppard Coil for any needed clarifications.

## 2018-09-23 LAB — CYTOLOGY - PAP
Diagnosis: NEGATIVE
HPV: NOT DETECTED

## 2018-09-24 LAB — CULTURE, GROUP A STREP
MICRO NUMBER: 91355900
SPECIMEN QUALITY:: ADEQUATE

## 2018-10-21 ENCOUNTER — Ambulatory Visit: Payer: BLUE CROSS/BLUE SHIELD

## 2019-03-30 DIAGNOSIS — G932 Benign intracranial hypertension: Secondary | ICD-10-CM | POA: Diagnosis not present

## 2019-03-30 DIAGNOSIS — R51 Headache: Secondary | ICD-10-CM | POA: Diagnosis not present

## 2019-05-26 DIAGNOSIS — Q165 Congenital malformation of inner ear: Secondary | ICD-10-CM | POA: Diagnosis not present

## 2019-05-26 DIAGNOSIS — H9311 Tinnitus, right ear: Secondary | ICD-10-CM | POA: Diagnosis not present

## 2019-05-26 DIAGNOSIS — H90A31 Mixed conductive and sensorineural hearing loss, unilateral, right ear with restricted hearing on the contralateral side: Secondary | ICD-10-CM | POA: Diagnosis not present

## 2019-05-26 DIAGNOSIS — G932 Benign intracranial hypertension: Secondary | ICD-10-CM | POA: Diagnosis not present

## 2019-07-16 ENCOUNTER — Telehealth: Payer: Self-pay | Admitting: Osteopathic Medicine

## 2019-07-16 DIAGNOSIS — H938X1 Other specified disorders of right ear: Secondary | ICD-10-CM | POA: Diagnosis not present

## 2019-07-16 DIAGNOSIS — H90A31 Mixed conductive and sensorineural hearing loss, unilateral, right ear with restricted hearing on the contralateral side: Secondary | ICD-10-CM | POA: Diagnosis not present

## 2019-07-16 DIAGNOSIS — R21 Rash and other nonspecific skin eruption: Secondary | ICD-10-CM

## 2019-07-16 DIAGNOSIS — G4733 Obstructive sleep apnea (adult) (pediatric): Secondary | ICD-10-CM | POA: Diagnosis not present

## 2019-07-16 DIAGNOSIS — R0981 Nasal congestion: Secondary | ICD-10-CM | POA: Diagnosis not present

## 2019-07-16 DIAGNOSIS — H903 Sensorineural hearing loss, bilateral: Secondary | ICD-10-CM | POA: Diagnosis not present

## 2019-07-16 DIAGNOSIS — Q165 Congenital malformation of inner ear: Secondary | ICD-10-CM | POA: Diagnosis not present

## 2019-07-16 NOTE — Telephone Encounter (Signed)
Dr. Ermalinda Barrios called and stated that she needs a new dermatology referral due to the other one expired.  She has been trying to reschedule her appointment since [place] hit and has been unable too until now.  The old referral is old and they stated she needed a new one. Please put referral in and I will resubmit to get patient scheduled.   Thank you  Fulton Mole

## 2019-07-17 ENCOUNTER — Other Ambulatory Visit: Payer: Self-pay | Admitting: Osteopathic Medicine

## 2019-07-17 DIAGNOSIS — Z1231 Encounter for screening mammogram for malignant neoplasm of breast: Secondary | ICD-10-CM

## 2019-07-17 NOTE — Telephone Encounter (Signed)
Orders in 

## 2019-07-17 NOTE — Telephone Encounter (Signed)
Pt advised.

## 2019-07-27 NOTE — Telephone Encounter (Signed)
Referral sent - CF °

## 2019-08-11 DIAGNOSIS — Z20828 Contact with and (suspected) exposure to other viral communicable diseases: Secondary | ICD-10-CM | POA: Diagnosis not present

## 2019-08-11 DIAGNOSIS — Z1159 Encounter for screening for other viral diseases: Secondary | ICD-10-CM | POA: Diagnosis not present

## 2019-08-11 DIAGNOSIS — H90A11 Conductive hearing loss, unilateral, right ear with restricted hearing on the contralateral side: Secondary | ICD-10-CM | POA: Diagnosis not present

## 2019-08-11 DIAGNOSIS — Z01812 Encounter for preprocedural laboratory examination: Secondary | ICD-10-CM | POA: Diagnosis not present

## 2019-08-11 DIAGNOSIS — G960 Cerebrospinal fluid leak: Secondary | ICD-10-CM | POA: Diagnosis not present

## 2019-08-18 DIAGNOSIS — H90A32 Mixed conductive and sensorineural hearing loss, unilateral, left ear with restricted hearing on the contralateral side: Secondary | ICD-10-CM | POA: Diagnosis not present

## 2019-08-18 DIAGNOSIS — Z79899 Other long term (current) drug therapy: Secondary | ICD-10-CM | POA: Diagnosis not present

## 2019-08-18 DIAGNOSIS — Q018 Encephalocele of other sites: Secondary | ICD-10-CM | POA: Diagnosis not present

## 2019-08-18 DIAGNOSIS — G9608 Other cranial cerebrospinal fluid leak: Secondary | ICD-10-CM | POA: Diagnosis not present

## 2019-08-18 DIAGNOSIS — H748X1 Other specified disorders of right middle ear and mastoid: Secondary | ICD-10-CM | POA: Diagnosis not present

## 2019-08-18 DIAGNOSIS — E669 Obesity, unspecified: Secondary | ICD-10-CM | POA: Diagnosis not present

## 2019-08-18 DIAGNOSIS — G9600 Cerebrospinal fluid leak, unspecified: Secondary | ICD-10-CM | POA: Diagnosis not present

## 2019-08-18 DIAGNOSIS — G932 Benign intracranial hypertension: Secondary | ICD-10-CM | POA: Diagnosis not present

## 2019-08-18 DIAGNOSIS — H90A31 Mixed conductive and sensorineural hearing loss, unilateral, right ear with restricted hearing on the contralateral side: Secondary | ICD-10-CM | POA: Diagnosis not present

## 2019-08-18 DIAGNOSIS — Z6837 Body mass index (BMI) 37.0-37.9, adult: Secondary | ICD-10-CM | POA: Diagnosis not present

## 2019-08-18 DIAGNOSIS — G9601 Cranial cerebrospinal fluid leak, spontaneous: Secondary | ICD-10-CM | POA: Diagnosis not present

## 2019-08-18 DIAGNOSIS — H7391 Unspecified disorder of tympanic membrane, right ear: Secondary | ICD-10-CM | POA: Diagnosis not present

## 2019-08-20 ENCOUNTER — Ambulatory Visit: Payer: BLUE CROSS/BLUE SHIELD

## 2019-08-31 ENCOUNTER — Telehealth: Payer: Self-pay | Admitting: Cardiology

## 2019-08-31 NOTE — Telephone Encounter (Signed)
IT OKAY FOR SISTER TO ATTEND  VISIT - NOTED ON SCHEDULE

## 2019-08-31 NOTE — Telephone Encounter (Signed)
Patient sister states that patient has had multiple brain surgeries, and her sister is her support person- she states she is also a patient of Dr.Harding and would like his okay for her to come to this appointment. Advised I would route a message to MD.

## 2019-08-31 NOTE — Telephone Encounter (Signed)
New Message     Patient's sister would like to speak to a nurse about coming into appointment with her on 09/03/19.  Please call her back to discuss.

## 2019-09-03 ENCOUNTER — Encounter

## 2019-09-03 ENCOUNTER — Encounter: Payer: Self-pay | Admitting: Cardiology

## 2019-09-03 ENCOUNTER — Ambulatory Visit (INDEPENDENT_AMBULATORY_CARE_PROVIDER_SITE_OTHER): Payer: BC Managed Care – PPO | Admitting: Cardiology

## 2019-09-03 ENCOUNTER — Ambulatory Visit (INDEPENDENT_AMBULATORY_CARE_PROVIDER_SITE_OTHER): Payer: BC Managed Care – PPO

## 2019-09-03 ENCOUNTER — Other Ambulatory Visit: Payer: Self-pay

## 2019-09-03 VITALS — BP 134/64 | HR 58 | Ht 67.0 in | Wt 249.0 lb

## 2019-09-03 DIAGNOSIS — R0789 Other chest pain: Secondary | ICD-10-CM | POA: Diagnosis not present

## 2019-09-03 DIAGNOSIS — M940 Chondrocostal junction syndrome [Tietze]: Secondary | ICD-10-CM

## 2019-09-03 DIAGNOSIS — Z1231 Encounter for screening mammogram for malignant neoplasm of breast: Secondary | ICD-10-CM

## 2019-09-03 DIAGNOSIS — R030 Elevated blood-pressure reading, without diagnosis of hypertension: Secondary | ICD-10-CM | POA: Diagnosis not present

## 2019-09-03 DIAGNOSIS — E782 Mixed hyperlipidemia: Secondary | ICD-10-CM

## 2019-09-03 NOTE — Progress Notes (Signed)
PCP: Emeterio Reeve, DO  Clinic Note: Chief Complaint  Patient presents with  . Hospitalization Follow-up    Chest pain    HPI:    Morgan Maddox is a 58 y.o. female who is being seen today for the ER follow-up for "atypical chest pain" at the request of Emeterio Reeve, DO.  Morgan Maddox was last seen in May 2017 as a follow-up for chest pain. Her sister Morgan Maddox is a patient of mine, and her mother Morgan Maddox) is a former patient of mine.  She is morbidly obese with a longstanding tobacco history and family history of CAD. -->  She had been to [facility] and evaluated for chest pain with a Myoview that was read as low risk. --> Chest pain that she had was felt to be consistent with costochondritis, and her symptoms are overall complicated by depression or anxiety. --> As needed follow-up suggested   Major issue has been diagnosis of pseudotumor cerebri [facility] -idiopathic intracranial hypertension) with R transverse Sinus Stent placement 912/04/2017) - for CSF leak from R myringotomy & tympanostomy tube   Recent Hospitalizations:   07/23/2019: [facility] ER evaluation for atypical chest pain --> noted tingling in her left arm and jaw and some squeezing in her chest.  Nonexertional symptoms.  08/18/2019 right sided mastoidectomy @ [facility]  Reviewed  CV studies:    The following studies were reviewed today: (if available, images/films reviewed: From Epic Chart or Care Everywhere) . None recently:   Interval History:   Morgan Maddox is here again with her sister (also a patient of mine) further follow-up from her recent ER visit.  Strange symptoms she describes of a tingling sensation across her upper chest into her arms and jaw that occur with or without any type of warning.  Not exertional. Basically talking to clarify, she has been having lots of issues going on with the hearing issues recent surgery, her diagnosis of pseudotumor cerebri etc.  She has lots  of tendency to be anxious and nervous.  She is always tearful.  She does drink quite a bit of coffee.  Apparently in the past she has tried SSRIs and has had lots of loose stools and is now very anti-SSRIs.  From a cardiac standpoint, she has not had any exertional chest pain or pressure.  No dyspnea on exertion other than expected from being deconditioned.  Prior to all of her surgery issues, she was actually walking couple miles a day.  She stated that before all the stuff happened and she was in a "good place ".  Now is having lots of stress.  Her sister tends to think that she is having issues with anxiety and depression and feeling lonely.  She denies any PND, orthopnea or edema.  She is always had some mild lower extremity swelling.  Despite only is tingling sensations in her chest, she is not feeling any rapid irregular heartbeats or palpitations.  She may get lightheaded here and there but no syncope or near syncope.  No TIA or amaurosis fugax.  Most of her neuro symptoms are related to hearing loss and dizziness from pseudotumor cerebri.    The patient does not have symptoms concerning for COVID-19 infection (fever, chills, cough, or new shortness of breath).  The patient is practicing social distancing. ++ Masking.  She does go out for groceries/shopping.  She does work mostly from home now.   REVIEWED OF SYSTEMS   ROS: A comprehensive was performed. Review of Systems  Constitutional: Negative for malaise/fatigue (Just has not felt like being very active since all of her surgery issues.).  HENT: Positive for congestion, ear pain and sinus pain. Negative for nosebleeds.        Postop symptoms from mastoidectomy etc.  Respiratory: Negative for cough, shortness of breath and wheezing.   Cardiovascular: Negative for claudication.  Gastrointestinal: Negative for blood in stool, heartburn and melena.  Genitourinary: Negative for hematuria.  Musculoskeletal: Negative for falls and joint  pain.  Neurological: Positive for dizziness and headaches.  Psychiatric/Behavioral: Positive for depression. The patient is nervous/anxious.        Has symptoms of anhedonia, sleeping and eating habits.  Depressed mood.  Constantly anxious and nervous  All other systems reviewed and are negative.  I have reviewed and (if needed) personally updated the patient's problem list, medications, allergies, past medical and surgical history, social and family history.   PAST MEDICAL HISTORY   Past Medical History:  Diagnosis Date  . Bilateral ovarian cysts   . Family history of premature CAD    Brother had an MI, sister has CAD. Mother had an MI at age 77. Still alive at 36.  [name] History of mononucleosis 04/21/2013  . History of sinus surgery 04/21/2013  . Hypertriglyceridemia 04/21/2013  . Morbid obesity with BMI of 40.0-44.9, adult ([place])    BMI roughly 43 noted on 02/09/2016 admission [name])  . 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 Pseudotumor cerebri 08/2017   Idiopathic intracerebral hypertension -> s/p right transverse sinus stenting  . Vitamin D deficiency     PAST SURGICAL HISTORY   Past Surgical History:  Procedure Laterality Date  . NASAL SINUS SURGERY    . NM MYOVIEW LTD  02/09/2016   [place]: EF 76%. Periapical defect with partial improvement during rest. No wall motion abnormality noted. Likely related to breast attenuation, cannot exclude small area of periapical ischemia. --> Cardiac catheterization not recommended. LOW RISK  . OVARIAN CYST SURGERY    . TONSILLECTOMY    . Transverse Sinus Stent  07/2017   [facility] - > for IHH/pseudotumor cerebri    MEDICATIONS/ALLERGIES   Current Meds  Medication Sig  . aspirin 81 MG chewable tablet Chew 81 mg by mouth daily.    Allergies  Allergen Reactions  . Calamine Rash  . Pramoxine-Calamine Rash and Other (See Comments)  . Diphenhydramine Hcl Rash     SOCIAL HISTORY/FAMILY HISTORY   Social History   Tobacco Use   . Smoking status: Former Smoker    Packs/day: 0.50    Years: 25.00    Pack years: 12.50    Types: Cigarettes  . Smokeless tobacco: Never Used  Substance Use Topics  . Alcohol use: Yes    Alcohol/week: 0.0 standard drinks    Comment: social  . Drug use: No   Social History   Social History Narrative   She works from home. Works 10-12 hour shifts. Significant social stress.   Lives alone. Spent most of her time alone. Does not usually interact personally with other people besides her sister    family history includes Coronary artery disease in her brother and mother; Coronary artery disease (age of onset: 68) in her sister; Diabetes in her mother; Heart attack in her brother and mother; Heart failure in her mother.   OBJCTIVE -PE, EKG, labs   Wt Readings from Last 3 Encounters:  09/03/19 249 lb (112.9 kg)  09/22/18 240 lb 9.6 oz (109.1 kg)  09/05/18 240 lb 12.8 oz (  109.2 kg)    Physical Exam: BP 134/64   Pulse (!) 58   Ht 5\' 7"  (1.702 m)   Wt 249 lb (112.9 kg)   BMI 39.00 kg/m  Physical Exam  Constitutional: She is oriented to person, place, and time. She appears well-developed.  Healthy-appearing/well-groomed.  Morbidly obese  HENT:  Head: Normocephalic.  Postop changes on right knee are noted.  No bleeding or infection or drainage  Eyes: Pupils are equal, round, and reactive to light. EOM are normal.  Neck: Normal range of motion. Neck supple. No hepatojugular reflux and no JVD present. Carotid bruit is not present.  Cardiovascular: Normal rate, regular rhythm, S2 normal and normal pulses. PMI is not displaced (Unable to palpate). Exam reveals distant heart sounds. Exam reveals no gallop and no friction rub.  No murmur heard. Pulmonary/Chest: Effort normal and breath sounds normal. No respiratory distress. She has no wheezes. She has no rales. She exhibits no tenderness.  Abdominal: Soft. Bowel sounds are normal. She exhibits no distension. There is no abdominal  tenderness. There is no rebound.  Unable to assess HSM due to body habitus  Musculoskeletal: Normal range of motion.        General: No edema.  Neurological: She is alert and oriented to person, place, and time.  Skin: Skin is warm and dry. No erythema.  Psychiatric: She has a normal mood and affect. Her behavior is normal. Judgment and thought content normal.  Vitals reviewed.   Adult ECG Report  Rate: 58 ;  Rhythm: sinus bradycardia, sinus arrhythmia and Normal axis, intervals and durations;   Narrative Interpretation: Relatively normal EKG  Recent Labs:   Lab Results  Component Value Date   CREATININE 0.86 07/15/2018   BUN 11 07/15/2018   NA 142 07/15/2018   K 5.4 (H) 07/15/2018   CL 101 07/15/2018   CO2 32 07/15/2018   Lab Results  Component Value Date   CHOL 196 07/15/2018   HDL 70 07/15/2018   LDLCALC 104 (H) 07/15/2018   TRIG 121 07/15/2018   CHOLHDL 2.8 07/15/2018   Lab Results  Component Value Date   WBC 7.5 07/15/2018   HGB 13.6 07/15/2018   HCT 41.2 07/15/2018   MCV 85.5 07/15/2018   PLT 365 07/15/2018    ASSESSMENT/PLAN    Problem List Items Addressed This Visit    Mixed hyperlipidemia (Chronic)    Interestingly, her HDL level went up in 2019 from 2017.  He had been 20.  Now 104. At present, still relatively acceptable.  We will allow her for time to recover from her ongoing surgical issues and reevaluate in roughly 6 months.  The plan is to check coronary calcium score at that time.  Depending on that result, we may choose to become more aggressive for stable course.      Morbid obesity (BMI 43.44)  (Chronic)    Continue to encourage her to get back into her exercise regimen.  I think the fact that her chest discomfort was not exertional it was a very atypical type of symptom and she is not having any more that she is safe to get back into walking once she is safe postoperatively.  I think a lot of her potential dietary indiscretions are related to  probable depression.      Relevant Orders   EKG 12-Lead (Completed)   Costochondritis    The symptoms she had several years ago was much more consistent with costochondritis.  Current episode does not seem  to be related to that.  She is not having the reproducible chest discomfort.  I suspect this most recent episode is more akin to a panic attack.      Elevated blood pressure reading    Blood pressure looks pretty good today and she is still quite stressed out.  At this point I think she probably fine not treating her blood pressure.      Relevant Orders   EKG 12-Lead (Completed)   Atypical chest pain - Primary    The symptoms she described in the hospital was very unusual tingling sensation across her chest associated with feeling anxious and nervous.  I have a suspicion that this was probably came to a panic attack.  She is not had any more symptoms, is stable.  No exertional symptoms.  At this point I am relatively confident that this episode was noncardiac in nature.  I think the best thing for her to do is get through her surgical issues, continue to get back into her exercise regimen.  We can reassess her in 6 months when things are more stable from a surgical standpoint and then we can discuss baseline cardiovascular evaluation with something like a coronary calcium score.  I do not think a stress test at this time would be helpful, mostly because it may very well delay her necessary surgery and since she is not actively having symptoms, I would not want to delay procedures.          COVID-19 Education: The signs and symptoms of COVID-19 were discussed with the patient and how to seek care for testing (follow up with PCP or arrange E-visit).   The importance of social distancing was discussed today.  I spent a total of 28 minutes with the patient and chart review. >  50% of the time was spent in direct patient consultation.  Additional time spent with chart review (studies,  outside notes, etc): 15 Total Time: 43 min   Current medicines are reviewed at length with the patient today.  (+/- concerns) - did not tolerate SSRI in the past -- refuses to take   Patient Instructions / Medication Changes & Studies & Tests Ordered   Patient Instructions  Medication Instructions:  NO CHANGES  *If you need a refill on your cardiac medications before your next appointment, please call your pharmacy*  Lab Work: NOT NEEDED   Testing/Procedures: NOT NEEDED  Follow-Up: At [facility], you and your health needs are our priority.  As part of our continuing mission to provide you with exceptional heart care, we have created designated Provider Care Teams.  These Care Teams include your primary Cardiologist (physician) and Advanced Practice Providers (APPs -  Physician Assistants and Nurse Practitioners) who all work together to provide you with the care you need, when you need it.  Your next appointment:   8 months  The format for your next appointment:   In Person  Provider:   Glenetta Hew, MD  Other Instructions    Studies Ordered:   Orders Placed This Encounter  Procedures  . EKG 12-Lead     Morgan Maddox, M.D., M.S. Interventional Cardiologist   Pager # [phone] Phone # [phone] [address] [place], [place] [address]   Thank you for choosing Heartcare at [facility]!!

## 2019-09-03 NOTE — Patient Instructions (Signed)
Medication Instructions:  NO CHANGES  *If you need a refill on your cardiac medications before your next appointment, please call your pharmacy*  Lab Work: NOT NEEDED   Testing/Procedures: NOT NEEDED  Follow-Up: At [facility], you and your health needs are our priority.  As part of our continuing mission to provide you with exceptional heart care, we have created designated Provider Care Teams.  These Care Teams include your primary Cardiologist (physician) and Advanced Practice Providers (APPs -  Physician Assistants and Nurse Practitioners) who all work together to provide you with the care you need, when you need it.  Your next appointment:   8 months  The format for your next appointment:   In Person  Provider:   Glenetta Hew, MD  Other Instructions

## 2019-09-05 ENCOUNTER — Encounter: Payer: Self-pay | Admitting: Cardiology

## 2019-09-05 DIAGNOSIS — R0789 Other chest pain: Secondary | ICD-10-CM | POA: Insufficient documentation

## 2019-09-05 DIAGNOSIS — M940 Chondrocostal junction syndrome [Tietze]: Secondary | ICD-10-CM | POA: Insufficient documentation

## 2019-09-05 NOTE — Assessment & Plan Note (Signed)
Blood pressure looks pretty good today and she is still quite stressed out.  At this point I think she probably fine not treating her blood pressure.

## 2019-09-05 NOTE — Assessment & Plan Note (Signed)
The symptoms she had several years ago was much more consistent with costochondritis.  Current episode does not seem to be related to that.  She is not having the reproducible chest discomfort.  I suspect this most recent episode is more akin to a panic attack.

## 2019-09-05 NOTE — Assessment & Plan Note (Signed)
The symptoms she described in the hospital was very unusual tingling sensation across her chest associated with feeling anxious and nervous.  I have a suspicion that this was probably came to a panic attack.  She is not had any more symptoms, is stable.  No exertional symptoms.  At this point I am relatively confident that this episode was noncardiac in nature.  I think the best thing for her to do is get through her surgical issues, continue to get back into her exercise regimen.  We can reassess her in 6 months when things are more stable from a surgical standpoint and then we can discuss baseline cardiovascular evaluation with something like a coronary calcium score.  I do not think a stress test at this time would be helpful, mostly because it may very well delay her necessary surgery and since she is not actively having symptoms, I would not want to delay procedures.

## 2019-09-05 NOTE — Assessment & Plan Note (Signed)
Continue to encourage her to get back into her exercise regimen.  I think the fact that her chest discomfort was not exertional it was a very atypical type of symptom and she is not having any more that she is safe to get back into walking once she is safe postoperatively.  I think a lot of her potential dietary indiscretions are related to probable depression.

## 2019-09-05 NOTE — Assessment & Plan Note (Signed)
Interestingly, her HDL level went up in 2019 from 2017.  He had been 38.  Now 104. At present, still relatively acceptable.  We will allow her for time to recover from her ongoing surgical issues and reevaluate in roughly 6 months.  The plan is to check coronary calcium score at that time.  Depending on that result, we may choose to become more aggressive for stable course.

## 2019-09-28 DIAGNOSIS — X32XXXS Exposure to sunlight, sequela: Secondary | ICD-10-CM | POA: Diagnosis not present

## 2019-09-28 DIAGNOSIS — D239 Other benign neoplasm of skin, unspecified: Secondary | ICD-10-CM | POA: Diagnosis not present

## 2019-09-28 DIAGNOSIS — L814 Other melanin hyperpigmentation: Secondary | ICD-10-CM | POA: Diagnosis not present

## 2019-09-28 DIAGNOSIS — L218 Other seborrheic dermatitis: Secondary | ICD-10-CM | POA: Diagnosis not present

## 2019-10-05 ENCOUNTER — Other Ambulatory Visit: Payer: Self-pay

## 2019-10-05 ENCOUNTER — Ambulatory Visit (INDEPENDENT_AMBULATORY_CARE_PROVIDER_SITE_OTHER): Payer: BC Managed Care – PPO | Admitting: Osteopathic Medicine

## 2019-10-05 ENCOUNTER — Encounter: Payer: Self-pay | Admitting: Osteopathic Medicine

## 2019-10-05 VITALS — BP 125/82 | HR 67 | Temp 98.1°F | Wt 251.1 lb

## 2019-10-05 DIAGNOSIS — F411 Generalized anxiety disorder: Secondary | ICD-10-CM

## 2019-10-05 MED ORDER — DIAZEPAM 5 MG PO TABS: 5.0000 mg | ORAL_TABLET | Freq: Three times a day (TID) | ORAL | 0 refills | Status: DC | PRN

## 2019-10-05 MED ORDER — TRAZODONE HCL 50 MG PO TABS: 25.0000 mg | ORAL_TABLET | Freq: Every evening | ORAL | 1 refills | Status: DC | PRN

## 2019-10-05 NOTE — Progress Notes (Signed)
HPI: Morgan Maddox is a 58 y.o. 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  she presents to [facility] today, 10/05/19,  for chief complaint of: Anxiety  S/p R mastoidectomy, repair R tegment tympani and mastoideum procedure performed 08/18/19, done for dx CSF leak, mixed conductive/sensorineural hearing loss - she is following with ENT, last appt there was 09/17/19 and healing well, f/u in another 3-4 weeks.   Recent visit w/ cardiologist 09/03/19 for atypical chest pain (ER f/u). "Strange symptoms she describes of a tingling sensation across her upper chest into her arms and jaw that occur with or without any type of warning.  Not exertional... Apparently in the past she has tried SSRIs and has had lots of loose stools and is now very anti-SSRIs... Now is having lots of stress.  Her sister tends to think that she is having issues with anxiety and depression and feeling lonely."   Was on schedule for annual, we cannot do preventive care visit plus problem based visit, pt opts to discuss anxiety today, defer wellness physical.   Patient reports increasing anxiety/irritability, she is brought to tears really easily.  This has been going on to 1 degree or another for several years but probably worse over the past few months.  Patient's sister accompanies her to visit, states that patient will get in her head about things, will start down a spiral/rabbit hole if she gets worried about 1 little thing will start to worry about multiple things down the line.  Has difficulty staying in the moment.  She is very resistant to therapy or medications  She states that  the Valium which was given to her by her dentist helps when she takes it but she is nervous to take this too often.  She states she was in the past placed on SSRIs, several different ones, which caused significant diarrhea.  She was on some kind of "medication that started with a T for sleep" but she cannot recall the exact name of the medicine    Depression screen [facility] 2/9 10/05/2019 07/15/2018 10/08/2016  Decreased Interest 0 0 0  Down, Depressed, Hopeless 0 0 0  PHQ - 2 Score 0 0 0  Altered sleeping 1 0 -  Tired, decreased energy 1 0 -  Change in appetite 0 0 -  Feeling bad or failure about yourself  0 0 -  Trouble concentrating 1 0 -  Moving slowly or fidgety/restless 2 0 -  Suicidal thoughts 0 0 -  PHQ-9 Score 5 0 -  Difficult doing work/chores Somewhat difficult - -   GAD 7 : Generalized Anxiety Score 10/05/2019 07/15/2018  Nervous, Anxious, on Edge 2 0  Control/stop worrying 2 0  Worry too much - different things 2 0  Trouble relaxing 3 0  Restless 2 0  Easily annoyed or irritable 2 0  Afraid - awful might happen 1 0  Total GAD 7 Score 14 0  Anxiety Difficulty Somewhat difficult -       Patient is accompanied by sister who assists with history-taking.   Past medical, surgical, social and family history reviewed:  Patient Active Problem List   Diagnosis Date Noted  . Atypical chest pain 09/05/2019  . Subacute maxillary  sinusitis 09/05/2018  . Poor compliance with medication 10/08/2016  . 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 Prediabetes 04/30/2016  . Costochondritis 03/04/2016  . Elevated blood pressure reading 03/23/2015  . Vitamin D deficiency 03/23/2015  . Medication management 03/23/2015  . Morbid obesity (BMI 43.44)  03/23/2015  . History of mononucleosis 04/21/2013  . 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 History of D&C 04/21/2013  . Mixed hyperlipidemia 04/21/2013    Past Surgical History:  Procedure Laterality Date  . MASTOIDECTOMY    . NASAL  SINUS SURGERY    . NM MYOVIEW LTD  02/09/2016   [place]: EF 76%. Periapical defect with partial improvement during rest. No wall motion abnormality noted. Likely related to breast attenuation, cannot exclude small area of periapical ischemia. --> Cardiac catheterization not recommended. LOW RISK  . OVARIAN CYST SURGERY    . TONSILLECTOMY    . Transverse Sinus Stent  07/2017   [facility] - > for IHH/pseudotumor cerebri    Social History   Tobacco Use  . Smoking status: Former Smoker    Packs/day: 0.50    Years: 25.00    Pack years: 12.50    Types: Cigarettes  . Smokeless tobacco: Never Used  Substance Use Topics  . Alcohol use: Yes    Alcohol/week: 0.0 standard drinks    Comment: social    Family History  Problem Relation Age of Onset  . Heart failure Mother        Long-standing CAD  . Heart attack Mother        First MI in 49s  . Coronary artery disease Mother   . Diabetes Mother   . Coronary artery disease Sister 32  . Heart attack Brother        In his 32s  . Coronary artery disease Brother      Current medication list and allergy/intolerance information reviewed:    Current Outpatient Medications  Medication Sig Dispense Refill  . aspirin 81 MG chewable tablet Chew 81 mg by mouth daily.    . clobetasol (TEMOVATE) 0.05 % external solution Apply 1 application topically 2 (two) times daily.    [name] desonide (DESOWEN) 0.05 % cream APP EXT AA BID FOR 2 WKS    . fluticasone (FLONASE) 50 MCG/ACT nasal spray Place 1 spray into both nostrils daily.    [name] ketoconazole (NIZORAL) 2 % shampoo APP EXT TO THE SCALP 2 TIMES A WK    . diazepam (VALIUM) 5 MG tablet Take 1 tablet (5 mg total) by mouth every 8 (eight) hours as needed (severe anxiety or panic). 10 tablet 0  . traZODone (DESYREL) 50 MG tablet Take 0.5-2 tablets (25-100 mg total) by mouth at bedtime as needed for sleep. 30 tablet 1   No current facility-administered medications for this visit.     Allergies  Allergen  Reactions  . Calamine Rash  . Pramoxine-Calamine Rash and Other (See Comments)  . Propofol Nausea And Vomiting    EXTREME NAUSEA & VOMITING! See 10/17/2017 surgical record for anesthesia   . Diphenhydramine Hcl Rash      Review of Systems:  Constitutional:  No  fever, no chills, No recent illness  HEENT: No  headache, no vision change  Cardiac: +chest pain per HPI, No  pressure  Respiratory:  No  shortness of breath. No  Cough  Gastrointestinal: No  abdominal pain, No  nausea  Musculoskeletal: No new myalgia/arthralgia  Neurologic: No  weakness, No  dizziness  Psychiatric: No  concerns with  depression, +concerns with anxiety, +sleep problems, +mood swings  Exam:  BP 125/82 (BP Location: Left Arm, Patient Position: Sitting, Cuff Size: Normal)   Pulse 67   Temp 98.1 F (36.7 C) (Oral)   Wt 251 lb 1.3 oz (113.9 kg)   BMI 39.32 kg/m   Constitutional: VS see above. General Appearance: alert, well-developed, well-nourished, NAD  Ears, Nose, Mouth, Throat: MMM, Normal external inspection ears/nares/mouth/lips/gums.   Neck: No masses, trachea midline. No thyroid enlargement.   Respiratory: Normal respiratory effort.   Musculoskeletal: Gait normal. No clubbing/cyanosis of digits.   Neurological: Normal balance/coordination. No tremor.   Skin: warm, dry, intact.   Psychiatric: Fair judgment/insight. Anxious. mood and affect. Oriented x3. Speech is somewhat tangential, pressured. Pt is easily brought to tears but recovers quickly.    No results found for this or any previous visit (from the past 72 hour(s)).  No results found.   ASSESSMENT/PLAN: The encounter diagnosis was Generalized anxiety disorder.   Long discussion with patient and her sister about medication options.  Given history of intolerance to SSRI, unknown reaction with Wellbutrin, she may be a good candidate for one of the newer medications like Trintellix.  She is very resistant to being on daily  medications or on anything which may even have a small chance of exacerbating her dizziness.  She is okay to try getting back on the "T" sleep medication which I would have to guess is either trazodone or temazepam?  We will start with the trazodone and see how this goes.  No orders of the defined types were placed in this encounter.   Meds ordered this encounter  Medications  . diazepam (VALIUM) 5 MG tablet    Sig: Take 1 tablet (5 mg total) by mouth every 8 (eight) hours as needed (severe anxiety or panic).    Dispense:  10 tablet    Refill:  0  . traZODone (DESYREL) 50 MG tablet    Sig: Take 0.5-2 tablets (25-100 mg total) by mouth at bedtime as needed for sleep.    Dispense:  30 tablet    Refill:  1    Patient Instructions  Plan:   Let's put you back on what I am pretty sure was the trazodone, to help with sleep.  Can take this as needed, or can take this nightly.  For anxiety, I am okay to refill the Valium 5 mg for you to have on hand to use at maximum once or twice per week as needed for severe anxiety/panic.  Think about whether you might want to try a daily medication to help generalized anxiety symptoms.   Think about whether you might want me to refer you to a PhD therapist to help with identifying triggers and developing healthy mental coping skills.            Visit summary with medication list and pertinent instructions was printed for patient to review. All questions at time of visit were answered - patient instructed to contact office with any additional concerns or updates. ER/RTC precautions were reviewed with the patient.   Note: Total time spent 40 minutes, greater than 50% of the visit was spent face-to-face counseling and coordinating care for the above diagnoses listed in assessment/plan.   Please note: voice recognition software was used to produce this document, and typos may escape review. Please contact Dr. Sheppard Coil for any needed clarifications.      Follow-up plan: Return in about 4 weeks (around 11/02/2019) for RECHECK ON NEW  MEDICATION FOR SLEEP / PANIC. SEE ME SOONER IF NEEDED. FYI to scheduler [id number] thanks! [name]

## 2019-10-05 NOTE — Patient Instructions (Addendum)
Plan:   Let's put you back on what I am pretty sure was the trazodone, to help with sleep.  Can take this as needed, or can take this nightly.  For anxiety, I am okay to refill the Valium 5 mg for you to have on hand to use at maximum once or twice per week as needed for severe anxiety/panic.  Think about whether you might want to try a daily medication to help generalized anxiety symptoms.   Think about whether you might want me to refer you to a PhD therapist to help with identifying triggers and developing healthy mental coping skills.

## 2019-11-04 ENCOUNTER — Other Ambulatory Visit: Payer: Self-pay

## 2019-11-04 ENCOUNTER — Encounter: Payer: Self-pay | Admitting: Osteopathic Medicine

## 2019-11-04 ENCOUNTER — Ambulatory Visit (INDEPENDENT_AMBULATORY_CARE_PROVIDER_SITE_OTHER): Payer: BC Managed Care – PPO | Admitting: Osteopathic Medicine

## 2019-11-04 VITALS — BP 116/72 | HR 55 | Temp 98.2°F | Wt 249.0 lb

## 2019-11-04 DIAGNOSIS — F419 Anxiety disorder, unspecified: Secondary | ICD-10-CM | POA: Diagnosis not present

## 2019-11-04 DIAGNOSIS — E559 Vitamin D deficiency, unspecified: Secondary | ICD-10-CM

## 2019-11-04 DIAGNOSIS — Z0000 Encounter for general adult medical examination without abnormal findings: Secondary | ICD-10-CM | POA: Diagnosis not present

## 2019-11-04 MED ORDER — TRAZODONE HCL 50 MG PO TABS: 50.0000 mg | ORAL_TABLET | Freq: Every evening | ORAL | 1 refills | Status: DC | PRN

## 2019-11-04 NOTE — Progress Notes (Signed)
HPI: Morgan Maddox is a 58 y.o. 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  she presents to [facility] today, 11/04/19,  for chief complaint of:  Follow-up mental health, annual check-up  See last visit note 10/05/19 for details. We started her on Trazodone at bedtime prn insomnia, Valium for sparing prn use for panic, pt was reluctant to get on maintenance therapy w/ SSRI or similar d/t concerns over dizziness and previous adverse effects. Today reports doing well, is ready to try counselor,prefers to see a PhD since her daughter is studying for PhD in neuropsychology  Otherwise no complaints or concerns today!    Wt Readings from Last 3 Encounters:  11/04/19 249 lb 0.6 oz (113 kg)  10/05/19 251 lb 1.3 oz (113.9 kg)  09/03/19 249 lb (112.9 kg)     Depression screen [facility] 2/9 11/04/2019 10/05/2019 07/15/2018  Decreased Interest 0 0 0  Down, Depressed, Hopeless 0 0 0  PHQ - 2 Score 0 0 0  Altered sleeping 1 1 0  Tired, decreased energy 1 1 0  Change in appetite 0 0 0  Feeling bad or failure about yourself  0 0 0  Trouble concentrating 0 1 0  Moving slowly or fidgety/restless 0 2 0  Suicidal thoughts 0 0 0  PHQ-9 Score 2 5 0  Difficult doing work/chores - Somewhat difficult -   GAD 7 : Generalized Anxiety Score 11/04/2019 10/05/2019 07/15/2018  Nervous, Anxious, on Edge 1 2 0  Control/stop worrying 1 2 0  Worry too much - different things 1 2 0  Trouble relaxing 0 3 0  Restless 1 2 0  Easily annoyed or irritable 1 2 0  Afraid - awful might happen 1 1 0  Total GAD 7 Score 6 14 0  Anxiety Difficulty - Somewhat difficult -            At today's visit 11/04/19 ... PMH, PSH, FH reviewed and updated as needed.  Current medication list and allergy/intolerance hx reviewed and updated as needed. (See remainder of HPI, ROS, Phys Exam below)   No results found.  No results found for this or any previous visit (from the past 72 hour(s)).        ASSESSMENT/PLAN: The primary encounter diagnosis was Annual physical exam. Diagnoses of Vitamin D deficiency and Anxiety were also pertinent to this visit.   Orders Placed This Encounter  Procedures  . CBC  . COMPLETE METABOLIC PANEL WITH GFR  . LIPID SCREENING  . HEPATITIS C SCREENING - IF DOB [birth year]  . HIV SCREEN  . VITAMIN D 25     Meds ordered this encounter  Medications  . traZODone (DESYREL) 50 MG tablet    Sig: Take 1 tablet (50 mg total) by mouth at bedtime as needed for sleep.    Dispense:  90 tablet    Refill:  1    There are no Patient Instructions on file for this visit.  General Preventive Care  Most recent routine screening lipids/other labs: ordered   Everyone should have blood pressure checked once per year.   Tobacco: don't!   Alcohol: responsible moderation is ok for most adults - if you have concerns about your alcohol intake, please talk  to me!   Exercise: as tolerated to reduce risk of cardiovascular disease and diabetes. Strength training will also prevent osteoporosis.   Mental health: if need for mental health care (medicines, counseling, other), or concerns about moods, please let me know!   Sexual health: if need for STD testing, or if concerns with libido/pain problems, please let me know!   Advanced Directive: Living Will and/or Healthcare Power of Attorney recommended  Vaccines  Flu vaccine: recommended for almost everyone, every fall.   Shingles vaccine: Shingrix recommended after age 56. Pt will think about this.   Pneumonia vaccines: Prevnar and Pneumovax recommended after age 23  Tetanus booster: Tdap  recommended every 10 years. Pt will think about this.  Cancer screenings   Colon cancer screening: due, pt has declined for now  Breast cancer screening: mammogram due next year   Cervical cancer screening: Pap every 1 to 5 years depending on age and other risk factors. Due!   Lung cancer screening: CT chest every year for those sge 59 to 57 years old with ?30 pack year smoking history, who either currently smoke or have quit within the past 15 years. Infection screenings . HIV: recommended screening at least once age 55-65, more often as needed. . Gonorrhea/Chlamydia: screening as needed . Hepatitis C: recommended for anyone born 51-1964 . TB: certain at-risk populations, or depending on work requirements and/or travel history Other . Bone Density Test: recommended for women at age 28         Follow-up plan: Return in about 6 months (around 05/04/2020) for recheck mental health - see me sooner if needed! .                                                 ################################################# ################################################# ################################################# #################################################    Current Meds  Medication Sig  . aspirin 81 MG chewable tablet Chew 81 mg by mouth daily.  . clobetasol (TEMOVATE) 0.05 % external solution Apply 1 application topically 2 (two) times daily.  [name] desonide (DESOWEN) 0.05 % cream APP EXT AA BID FOR 2 WKS  . diazepam (VALIUM) 5 MG tablet Take 1 tablet (5 mg total) by mouth every 8 (eight) hours as needed (severe anxiety or panic).  [name] ketoconazole (NIZORAL) 2 % shampoo APP EXT TO THE SCALP 2 TIMES A WK  . traZODone (DESYREL) 50 MG tablet Take 1 tablet (50 mg total) by mouth at bedtime as needed for sleep.  . [DISCONTINUED] fluticasone (FLONASE) 50 MCG/ACT nasal spray Place 1 spray into both nostrils daily.  . [DISCONTINUED] traZODone  (DESYREL) 50 MG tablet Take 0.5-2 tablets (25-100 mg total) by mouth at bedtime as needed for sleep.    Allergies  Allergen Reactions  . Calamine Rash  . Pramoxine-Calamine Rash and Other (See Comments)  . Propofol Nausea And Vomiting    EXTREME NAUSEA & VOMITING! See 10/17/2017 surgical record for anesthesia    . Diphenhydramine Hcl Rash       Review of Systems:  Constitutional: No recent illness  HEENT: No  headache, no vision change  Cardiac: No  chest pain, No  pressure, No palpitations  Respiratory:  No  shortness of breath. No  Cough  Gastrointestinal: No  abdominal pain, no change on bowel habits  Musculoskeletal: No new myalgia/arthralgia  Skin: No  Rash  Hem/Onc: No  easy bruising/bleeding, No  abnormal lumps/bumps  Neurologic: No  weakness, No  Dizziness  Psychiatric: No  concerns with depression, No  concerns with anxiety  Exam:  BP 116/72 (BP Location: Left Arm, Patient Position: Sitting, Cuff Size: Large)   Pulse (!) 55   Temp 98.2 F (36.8 C) (Oral)   Wt 249 lb 0.6 oz (113 kg)   BMI 39.01 kg/m   Constitutional: VS see above. General Appearance: alert, well-developed, well-nourished, NAD  Eyes: Normal lids and conjunctive, non-icteric sclera  Ears, Nose, Mouth, Throat: MMM, Normal external inspection ears/nares/mouth/lips/gums.  Neck: No masses, trachea midline.   Respiratory: Normal respiratory effort. no wheeze, no rhonchi, no rales  Cardiovascular: S1/S2 normal, no murmur, no rub/gallop auscultated. RRR.   Musculoskeletal: Gait normal. Symmetric and independent movement of all extremities  Abdominal: non-tender, non-distended, no appreciable organomegaly, neg Stange's, BS [id number]  Neurological: Normal balance/coordination. No tremor.  Skin: warm, dry, intact.   Psychiatric: Normal judgment/insight. Normal mood and affect. Oriented x3.       Visit summary with medication list and pertinent instructions was printed for patient to  review, patient was advised to alert [place] if any updates are needed. All questions at time of visit were answered - patient instructed to contact office with any additional concerns. ER/RTC precautions were reviewed with the patient and understanding verbalized.     Please note: voice recognition software was used to produce this document, and typos may escape review. Please contact Dr. Sheppard Coil for any needed clarifications.    Follow up plan: Return in about 6 months (around 05/04/2020) for recheck mental health - see me sooner if needed! [name]

## 2019-11-05 LAB — LIPID PANEL
Cholesterol: 175 mg/dL (ref <200)
HDL: 70 mg/dL (ref >=50)
LDL Cholesterol (Calc): 89 mg/dL (calc)
Non-HDL Cholesterol (Calc): 105 mg/dL (calc) (ref <130)
Total CHOL/HDL Ratio: 2.5 (calc) (ref <5.0)
Triglycerides: 70 mg/dL (ref <150)

## 2019-11-05 LAB — CBC
HCT: 41.1 % (ref 35.0–45.0)
Hemoglobin: 13.4 g/dL (ref 11.7–15.5)
MCH: 27.9 pg (ref 27.0–33.0)
MCHC: 32.6 g/dL (ref 32.0–36.0)
MCV: 85.4 fL (ref 80.0–100.0)
MPV: 10.2 fL (ref 7.5–12.5)
Platelets: 302 10*3/uL (ref 140–400)
RBC: 4.81 10*6/uL (ref 3.80–5.10)
RDW: 12.5 % (ref 11.0–15.0)
WBC: 5.6 10*3/uL (ref 3.8–10.8)

## 2019-11-05 LAB — COMPLETE METABOLIC PANEL WITH GFR
AG Ratio: 2 (calc) (ref 1.0–2.5)
ALT: 8 U/L (ref 6–29)
AST: 11 U/L (ref 10–35)
Albumin: 4.4 g/dL (ref 3.6–5.1)
Alkaline phosphatase (APISO): 63 U/L (ref 37–153)
BUN: 15 mg/dL (ref 7–25)
CO2: 25 mmol/L (ref 20–32)
Calcium: 9.3 mg/dL (ref 8.6–10.4)
Chloride: 102 mmol/L (ref 98–110)
Creat: 0.73 mg/dL (ref 0.50–1.05)
GFR, Est African American: 106 mL/min/{1.73_m2} (ref >=60)
GFR, Est Non African American: 91 mL/min/{1.73_m2} (ref >=60)
Globulin: 2.2 g/dL (calc) (ref 1.9–3.7)
Glucose, Bld: 86 mg/dL (ref 65–99)
Potassium: 4.7 mmol/L (ref 3.5–5.3)
Sodium: 138 mmol/L (ref 135–146)
Total Bilirubin: 0.5 mg/dL (ref 0.2–1.2)
Total Protein: 6.6 g/dL (ref 6.1–8.1)

## 2019-11-05 LAB — VITAMIN D 25 HYDROXY (VIT D DEFICIENCY, FRACTURES): Vit D, 25-Hydroxy: 28 ng/mL — ABNORMAL LOW (ref 30–100)

## 2019-11-05 LAB — HEPATITIS C ANTIBODY
Hepatitis C Ab: NONREACTIVE
SIGNAL TO CUT-OFF: 0.01 (ref <1.00)

## 2019-11-05 LAB — HIV ANTIBODY (ROUTINE TESTING W REFLEX): HIV 1&2 Ab, 4th Generation: NONREACTIVE

## 2019-11-10 ENCOUNTER — Ambulatory Visit: Payer: BC Managed Care – PPO | Admitting: Osteopathic Medicine

## 2019-12-01 DIAGNOSIS — H903 Sensorineural hearing loss, bilateral: Secondary | ICD-10-CM | POA: Diagnosis not present

## 2019-12-01 DIAGNOSIS — Z9889 Other specified postprocedural states: Secondary | ICD-10-CM | POA: Diagnosis not present

## 2019-12-01 DIAGNOSIS — Z9089 Acquired absence of other organs: Secondary | ICD-10-CM | POA: Diagnosis not present

## 2019-12-01 DIAGNOSIS — G932 Benign intracranial hypertension: Secondary | ICD-10-CM | POA: Diagnosis not present

## 2020-01-28 ENCOUNTER — Telehealth: Payer: Self-pay | Admitting: Cardiology

## 2020-01-28 NOTE — Telephone Encounter (Signed)
Pt called and wanted to know if it was safe for her to take her Baby Aspirin tomorrow when she gets her COVID vaccine

## 2020-01-29 NOTE — Telephone Encounter (Signed)
late entry . Spoke to patient at 3:30 pm  01/28/20 . Informed her baby aspirin will not interfere with taking the covid vaccine   "We are recommending the COVID-19 vaccine to all of our patients. Cardiac medications (including blood thinners) should not deter anyone from being vaccinated and there is no need to hold any of those medications prior to vaccine administration. "

## 2020-02-23 DIAGNOSIS — Q165 Congenital malformation of inner ear: Secondary | ICD-10-CM | POA: Diagnosis not present

## 2020-02-23 DIAGNOSIS — H9071 Mixed conductive and sensorineural hearing loss, unilateral, right ear, with unrestricted hearing on the contralateral side: Secondary | ICD-10-CM | POA: Diagnosis not present

## 2020-02-23 DIAGNOSIS — G9601 Cranial cerebrospinal fluid leak, spontaneous: Secondary | ICD-10-CM | POA: Diagnosis not present

## 2020-03-21 DIAGNOSIS — Z87891 Personal history of nicotine dependence: Secondary | ICD-10-CM | POA: Diagnosis not present

## 2020-03-21 DIAGNOSIS — Z9889 Other specified postprocedural states: Secondary | ICD-10-CM | POA: Diagnosis not present

## 2020-03-21 DIAGNOSIS — H04201 Unspecified epiphora, right lacrimal gland: Secondary | ICD-10-CM | POA: Diagnosis not present

## 2020-03-21 DIAGNOSIS — J343 Hypertrophy of nasal turbinates: Secondary | ICD-10-CM | POA: Diagnosis not present

## 2020-03-21 DIAGNOSIS — J342 Deviated nasal septum: Secondary | ICD-10-CM | POA: Diagnosis not present

## 2020-03-21 DIAGNOSIS — J3489 Other specified disorders of nose and nasal sinuses: Secondary | ICD-10-CM | POA: Diagnosis not present

## 2020-04-04 DIAGNOSIS — J3489 Other specified disorders of nose and nasal sinuses: Secondary | ICD-10-CM | POA: Diagnosis not present

## 2020-04-06 DIAGNOSIS — J343 Hypertrophy of nasal turbinates: Secondary | ICD-10-CM | POA: Diagnosis not present

## 2020-04-06 DIAGNOSIS — J3489 Other specified disorders of nose and nasal sinuses: Secondary | ICD-10-CM | POA: Diagnosis not present

## 2020-04-06 DIAGNOSIS — H04201 Unspecified epiphora, right lacrimal gland: Secondary | ICD-10-CM | POA: Diagnosis not present

## 2020-04-06 DIAGNOSIS — J342 Deviated nasal septum: Secondary | ICD-10-CM | POA: Diagnosis not present

## 2020-04-18 ENCOUNTER — Ambulatory Visit: Payer: BC Managed Care – PPO | Admitting: Cardiology

## 2020-05-02 ENCOUNTER — Other Ambulatory Visit: Payer: Self-pay

## 2020-05-02 ENCOUNTER — Ambulatory Visit (INDEPENDENT_AMBULATORY_CARE_PROVIDER_SITE_OTHER): Payer: BC Managed Care – PPO | Admitting: Osteopathic Medicine

## 2020-05-02 ENCOUNTER — Encounter: Payer: Self-pay | Admitting: Osteopathic Medicine

## 2020-05-02 VITALS — BP 134/93 | HR 69 | Temp 98.3°F | Wt 266.0 lb

## 2020-05-02 DIAGNOSIS — W57XXXA Bitten or stung by nonvenomous insect and other nonvenomous arthropods, initial encounter: Secondary | ICD-10-CM | POA: Diagnosis not present

## 2020-05-02 DIAGNOSIS — Z1211 Encounter for screening for malignant neoplasm of colon: Secondary | ICD-10-CM | POA: Diagnosis not present

## 2020-05-02 DIAGNOSIS — F419 Anxiety disorder, unspecified: Secondary | ICD-10-CM

## 2020-05-02 NOTE — Progress Notes (Signed)
Morgan Maddox is a 59 y.o. female who presents to  [place] at [facility]  today, 05/02/20, seeking care for the following: . Routine check-up, med refills, metnal health. Doing well! . Concern about tick bite few weeks ago, <24h attachment and no target lesion, small itching spot  . Never did the Cologuard, requests reorder this      ASSESSMENT & PLAN with other pertinent history/findings:  The primary encounter diagnosis was Anxiety. Diagnoses of Colon cancer screening and Tick bite, initial encounter were also pertinent to this visit.  1. Colon cancer screening Cologuard  2. Anxiety Good control, has Valium left over   3. Tick bite, initial encounter Skin shows no target lesion, small healing pink area c/w allergic reaction/healing excoriation     There are no Patient Instructions on file for this visit.   Orders Placed This Encounter  Procedures  . Cologuard    No orders of the defined types were placed in this encounter.      Follow-up instructions: Return in about 6 months (around 11/01/2020) for [place] (call week prior to visit for lab orders).                                         BP (!) 134/93 (BP Location: Left Arm, Patient Position: Sitting, Cuff Size: Large)   Pulse 69   Temp 98.3 F (36.8 C) (Oral)   Wt 266 lb (120.7 kg)   BMI 41.66 kg/m   Current Meds  Medication Sig  . aspirin 81 MG chewable tablet Chew 81 mg by mouth daily.  . clobetasol (TEMOVATE) 0.05 % external solution Apply 1 application topically 2 (two) times daily.  [name] desonide (DESOWEN) 0.05 % cream APP EXT AA BID FOR 2 WKS  . diazepam (VALIUM) 5 MG tablet Take 1 tablet (5 mg total) by mouth every 8 (eight) hours as needed (severe anxiety or panic).  [name] ketoconazole (NIZORAL) 2 % shampoo APP EXT TO THE SCALP 2 TIMES A WK  . traZODone (DESYREL) 50 MG tablet Take 1 tablet (50 mg total) by mouth at  bedtime as needed for sleep.    No results found for this or any previous visit (from the past 72 hour(s)).  No results found.  Depression screen [facility] 2/9 11/04/2019 10/05/2019 07/15/2018  Decreased Interest 0 0 0  Down, Depressed, Hopeless 0 0 0  PHQ - 2 Score 0 0 0  Altered sleeping 1 1 0  Tired, decreased energy 1 1 0  Change in appetite 0 0 0  Feeling bad or failure about yourself  0 0 0  Trouble concentrating 0 1 0  Moving slowly or fidgety/restless 0 2 0  Suicidal thoughts 0 0 0  PHQ-9 Score 2 5 0  Difficult doing work/chores - Somewhat difficult -    GAD 7 : Generalized Anxiety Score 11/04/2019 10/05/2019 07/15/2018  Nervous, Anxious, on Edge 1 2 0  Control/stop worrying 1 2 0  Worry too much - different things 1 2 0  Trouble relaxing 0 3 0  Restless 1 2 0  Easily annoyed or irritable 1 2 0  Afraid - awful might happen 1 1 0  Total GAD 7 Score 6 14 0  Anxiety Difficulty - Somewhat difficult -      All questions at time of visit were answered - patient instructed to contact  office with any additional concerns or updates.  ER/RTC precautions were reviewed with the patient.  Please note: voice recognition software was used to produce this document, and typos may escape review. Please contact Dr. Sheppard Coil for any needed clarifications.

## 2020-05-09 ENCOUNTER — Ambulatory Visit: Payer: BC Managed Care – PPO | Admitting: Cardiology

## 2020-05-20 DIAGNOSIS — R519 Headache, unspecified: Secondary | ICD-10-CM | POA: Diagnosis not present

## 2020-06-06 ENCOUNTER — Other Ambulatory Visit: Payer: Self-pay

## 2020-06-06 ENCOUNTER — Encounter: Payer: Self-pay | Admitting: Cardiology

## 2020-06-06 ENCOUNTER — Ambulatory Visit: Payer: BC Managed Care – PPO | Admitting: Cardiology

## 2020-06-06 DIAGNOSIS — M940 Chondrocostal junction syndrome [Tietze]: Secondary | ICD-10-CM

## 2020-06-06 DIAGNOSIS — E782 Mixed hyperlipidemia: Secondary | ICD-10-CM

## 2020-06-06 NOTE — Progress Notes (Signed)
Primary Care Provider: Emeterio Reeve, DO Cardiologist: Glenetta Hew, MD Electrophysiologist: None  Clinic Note: Chief Complaint  Patient presents with  . Follow-up    Overall symptoms doing much better  . Chest Pain    Minimal and not necessarily exertional  . Tachycardia    Short bursts of fast heart rates   . HPI:    Morgan Maddox is a 59 y.o. female with a PMH below who presents today for 66-month follow-up.  Morgan Maddox was last seen on September 03, 2019--was still recovering from her sinus/pseudotumor cerebri surgery.  Was having issues with hearing and headache.  Lots of anxiety and nervousness.  Quite tearful.  Reluctant to trying SSRIs because of loose stools.  Essentially stable prior standpoint.  Recent Hospitalizations: None  Reviewed  CV studies:    The following studies were reviewed today: (if available, images/films reviewed: From Epic Chart or Care Everywhere) . None:   Interval History:   Morgan Maddox here today in good spirits.  She actually seems to be in a pretty good mood.  She is very happy that she is not having as many of the panic spells with tachycardia palpitations.  She was started on trazodone.  She is getting little better sleep.  The main issues he is having now is that she has these episodes where she still feels that her brain is being "squished" especially if she lays on one side.  Apparently she has been told now that she had only partial deviated septum surgery done, and pending potential surgery.  She put on quite a bit of weight over the last few years, but is now finally in the process of trying to lose some of his weight by adjusting her diet and thickening of activity. She quit smoking in 2018, and did not really have bimaxillary significant anxiety as well.  She says her breathing is improved.  No longer having exertional dyspnea frequent cough.  She has been having swelling in her legs intermittently but she has cut  down her salt and fluid intake.  She is now on low-dose Lasix which she mostly takes daily but to take it as needed as well.  Her breathing and edema has notably improved with just this low-dose of Lasix.  Without the significant anxiety, her palpitation spells or not all that significant.  CV Review of Symptoms (Summary) Cardiovascular ROS: positive for - edema, orthopnea, shortness of breath and A few episodes of palpitations and some exertional dyspnea, -> notably better negative for - chest pain, irregular heartbeat, orthopnea, palpitations, paroxysmal nocturnal dyspnea, rapid heart rate, shortness of breath or Syncope/near syncope, TIA/amaurosis fugax, claudication  The patient does not have symptoms concerning for COVID-19 infection (fever, chills, cough, or new shortness of breath).  The patient is practicing social distancing & Masking.   She has had both COVID-19 vaccine injections  REVIEWED OF SYSTEMS   Review of Systems  Constitutional: Positive for weight loss. Negative for malaise/fatigue.  HENT: Positive for nosebleeds. Negative for congestion.   Respiratory: Negative for shortness of breath.   Cardiovascular:       HPI  Genitourinary: Negative for hematuria.  Musculoskeletal: Negative for joint pain.  Neurological: Positive for dizziness and headaches. Negative for focal weakness, seizures and weakness.       Related to her pseudotumor cerebri, and complications of surgery center.  Psychiatric/Behavioral: Negative for depression and suicidal ideas. The patient is not nervous/anxious and does not have insomnia.  I have reviewed and (if needed) personally updated the patient's problem list, medications, allergies, past medical and surgical history, social and family history.   PAST MEDICAL HISTORY   Past Medical History:  Diagnosis Date  . Bilateral ovarian cysts   . Family history of premature CAD    Brother had an MI, sister has CAD. Mother had an MI at age 59.  Still alive at 31.  [name] History of mononucleosis 04/21/2013  . History of sinus surgery 04/21/2013  . Hypertriglyceridemia 04/21/2013  . Morbid obesity with BMI of 40.0-44.9, adult ([place])    BMI roughly 43 noted on 02/09/2016 admission [name])  . 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 Pseudotumor cerebri 08/2017   Idiopathic intracerebral hypertension -> s/p right transverse sinus stenting  . Vitamin D deficiency     PAST SURGICAL HISTORY   Past Surgical History:  Procedure Laterality Date  . MASTOIDECTOMY    . NASAL SINUS SURGERY    . NM MYOVIEW LTD  02/09/2016   [place]: EF 76%. Periapical defect with partial improvement during rest. No wall motion abnormality noted. Likely related to breast attenuation, cannot exclude small area of periapical ischemia. --> Cardiac catheterization not recommended. LOW RISK  . OVARIAN CYST SURGERY    . TONSILLECTOMY    . Transverse Sinus Stent  07/2017   [facility] - > for IHH/pseudotumor cerebri    MEDICATIONS/ALLERGIES   Current Meds  Medication Sig  . aspirin 81 MG chewable tablet Chew 81 mg by mouth daily.  . clobetasol (TEMOVATE) 0.05 % external solution Apply 1 application topically 2 (two) times daily.  [name] desonide (DESOWEN) 0.05 % cream APP EXT AA BID FOR 2 WKS  . furosemide (LASIX) 20 MG tablet Take 20 mg by mouth daily.  [name] ketoconazole (NIZORAL) 2 % shampoo APP EXT TO THE SCALP 2 TIMES A WK  . traZODone (DESYREL) 50 MG tablet Take 1 tablet (50 mg total) by mouth at bedtime as needed for sleep.    Allergies  Allergen Reactions  . Calamine Rash  . Pramoxine-Calamine Rash and Other (See Comments)  . Propofol Nausea And Vomiting    EXTREME NAUSEA & VOMITING! See 10/17/2017 surgical record for anesthesia    . Diphenhydramine Hcl Rash    SOCIAL HISTORY/FAMILY HISTORY   Reviewed in Epic:  Pertinent findings: No major changes.  She has definitely try to pick up her level of exercise and is having a hard time adjusting her  diet.  OBJCTIVE -PE, EKG, labs   Wt Readings from Last 3 Encounters:  06/06/20 (!) 258 lb 9.6 oz (117.3 kg)  05/02/20 266 lb (120.7 kg)  11/04/19 249 lb 0.6 oz (113 kg)    Physical Exam: BP (!) 134/86   Pulse 74   Temp (!) 97 F (36.1 C)   Ht 5\' 7"  (1.702 m)   Wt (!) 258 lb 9.6 oz (117.3 kg)   SpO2 96%   BMI 40.50 kg/m  Physical Exam Constitutional:      Appearance: Normal appearance.  HENT:     Head: Normocephalic and atraumatic.  Cardiovascular:     Rate and Rhythm: Normal rate and regular rhythm.     Heart sounds: Normal heart sounds.  Pulmonary:     Effort: Pulmonary effort is normal. No respiratory distress.     Breath sounds: Normal breath sounds.  Chest:     Chest wall: No tenderness.  Musculoskeletal:        General: No swelling.     Cervical  back: Normal range of motion.  Neurological:     Mental Status: She is alert. She is disoriented.  Psychiatric:        Mood and Affect: Mood normal.        Behavior: Behavior normal.        Thought Content: Thought content normal.        Judgment: Judgment normal.     Adult ECG Report  Rate: 74 ;  Rhythm: normal sinus rhythm and Otherwise normal axis, intervals and durations.;   Narrative Interpretation: Normal EKG  Recent Labs:    Lab Results  Component Value Date   CHOL 175 11/04/2019   HDL 70 11/04/2019   LDLCALC 89 11/04/2019   TRIG 70 11/04/2019   CHOLHDL 2.5 11/04/2019   Lab Results  Component Value Date   CREATININE 0.73 11/04/2019   BUN 15 11/04/2019   NA 138 11/04/2019   K 4.7 11/04/2019   CL 102 11/04/2019   CO2 25 11/04/2019   Lab Results  Component Value Date   TSH 1.85 07/15/2018    ASSESSMENT/PLAN    Problem List Items Addressed This Visit    Mixed hyperlipidemia (Chronic)    Lipids show LDL of 89.  Pretty much at goal.  No meds.      Relevant Medications   furosemide (LASIX) 20 MG tablet   Morbid obesity (BMI 43.44)  (Chronic)    Weights Now.  Lollis that she has been  because of the fact that she has a hard time doing much as well although dizziness and poor balance that she has from her pseudotumor.  She is also been having issues with rehab in general. We talked about the importance of adjusting her diet and try to get some exercise even if it means going to a pool to do water aerobics.      Costochondritis    Will noted her having these symptoms in the past, she has not had any similar to this recently.        Biggest concern and reasoning for wanting to continue to follow-up with cardiology is because of her mother's history.  She does not have any active anginal or heart failure symptoms going at this point.  No significant palpitations.  We agreed to have her return in a year for follow-up either in person or telemedicine.   COVID-19 Education: The signs and symptoms of COVID-19 were discussed with the patient and how to seek care for testing (follow up with PCP or arrange E-visit).   The importance of social distancing and COVID-19 vaccination was discussed today.  I spent a total of 18minutes with the patient. >  50% of the time was spent in direct patient consultation.  Additional time spent with chart review  / charting (studies, outside notes, etc): 10 Total clinic time: 35 min   Current medicines are reviewed at length with the patient today.  (+/- concerns) none  Notice: This dictation was prepared with Dragon dictation along with smaller phrase technology. Any transcriptional errors that result from this process are unintentional and may not be corrected upon review.  Patient Instructions / Medication Changes & Studies & Tests Ordered   Patient Instructions  Medication Instructions:  Your physician recommends that you continue on your current medications as directed. Please refer to the Current Medication list given to you today.   Follow-Up: At [facility], you and your health needs are our priority.  As part of our continuing  mission to provide  you with exceptional heart care, we have created designated Provider Care Teams.  These Care Teams include your primary Cardiologist (physician) and Advanced Practice Providers (APPs -  Physician Assistants and Nurse Practitioners) who all work together to provide you with the care you need, when you need it.  We recommend signing up for the patient portal called "MyChart".  Sign up information is provided on this After Visit Summary.  MyChart is used to connect with patients for Virtual Visits (Telemedicine).  Patients are able to view lab/test results, encounter notes, upcoming appointments, etc.  Non-urgent messages can be sent to your provider as well.   To learn more about what you can do with MyChart, go to [email or web address].    Your next appointment:   24 month(s)  The format for your next appointment:   In Person  Provider:   Glenetta Hew, MD     Studies Ordered:   No orders of the defined types were placed in this encounter.    Glenetta Hew, M.D., M.S. Interventional Cardiologist   Pager # [phone] Phone # [phone] [address] [place], [place] [address]   Thank you for choosing Heartcare at [facility]!!

## 2020-06-06 NOTE — Patient Instructions (Signed)
Medication Instructions:  Your physician recommends that you continue on your current medications as directed. Please refer to the Current Medication list given to you today.   Follow-Up: At [facility], you and your health needs are our priority.  As part of our continuing mission to provide you with exceptional heart care, we have created designated Provider Care Teams.  These Care Teams include your primary Cardiologist (physician) and Advanced Practice Providers (APPs -  Physician Assistants and Nurse Practitioners) who all work together to provide you with the care you need, when you need it.  We recommend signing up for the patient portal called "MyChart".  Sign up information is provided on this After Visit Summary.  MyChart is used to connect with patients for Virtual Visits (Telemedicine).  Patients are able to view lab/test results, encounter notes, upcoming appointments, etc.  Non-urgent messages can be sent to your provider as well.   To learn more about what you can do with MyChart, go to [email or web address].    Your next appointment:   24 month(s)  The format for your next appointment:   In Person  Provider:   Glenetta Hew, MD

## 2020-06-09 ENCOUNTER — Encounter: Payer: Self-pay | Admitting: Cardiology

## 2020-06-09 NOTE — Assessment & Plan Note (Signed)
Will noted her having these symptoms in the past, she has not had any similar to this recently.

## 2020-06-09 NOTE — Assessment & Plan Note (Signed)
Lipids show LDL of 89.  Pretty much at goal.  No meds.

## 2020-06-09 NOTE — Assessment & Plan Note (Signed)
Weights Now.  Lollis that she has been because of the fact that she has a hard time doing much as well although dizziness and poor balance that she has from her pseudotumor.  She is also been having issues with rehab in general. We talked about the importance of adjusting her diet and try to get some exercise even if it means going to a pool to do water aerobics.

## 2020-06-24 ENCOUNTER — Encounter: Payer: Self-pay | Admitting: Osteopathic Medicine

## 2020-06-24 LAB — COLOGUARD: Cologuard: POSITIVE — AB

## 2020-06-27 DIAGNOSIS — G9600 Cerebrospinal fluid leak, unspecified: Secondary | ICD-10-CM | POA: Insufficient documentation

## 2020-06-27 DIAGNOSIS — H902 Conductive hearing loss, unspecified: Secondary | ICD-10-CM | POA: Insufficient documentation

## 2020-06-30 LAB — COLOGUARD: COLOGUARD: POSITIVE — AB

## 2020-07-01 ENCOUNTER — Encounter: Payer: Self-pay | Admitting: Osteopathic Medicine

## 2020-07-01 ENCOUNTER — Other Ambulatory Visit: Payer: Self-pay | Admitting: Osteopathic Medicine

## 2020-07-01 DIAGNOSIS — R195 Other fecal abnormalities: Secondary | ICD-10-CM

## 2020-07-01 NOTE — Progress Notes (Signed)
(+)  cologuard  --> refer GI colonoscopy  MyChart msg sent to pt to inform of results/plan

## 2020-07-11 ENCOUNTER — Encounter: Payer: Self-pay | Admitting: Gastroenterology

## 2020-07-11 DIAGNOSIS — H04223 Epiphora due to insufficient drainage, bilateral lacrimal glands: Secondary | ICD-10-CM | POA: Diagnosis not present

## 2020-07-11 DIAGNOSIS — J343 Hypertrophy of nasal turbinates: Secondary | ICD-10-CM | POA: Diagnosis not present

## 2020-07-11 DIAGNOSIS — H04551 Acquired stenosis of right nasolacrimal duct: Secondary | ICD-10-CM | POA: Diagnosis not present

## 2020-07-11 DIAGNOSIS — H04123 Dry eye syndrome of bilateral lacrimal glands: Secondary | ICD-10-CM | POA: Diagnosis not present

## 2020-07-13 DIAGNOSIS — J342 Deviated nasal septum: Secondary | ICD-10-CM | POA: Diagnosis not present

## 2020-07-13 DIAGNOSIS — J343 Hypertrophy of nasal turbinates: Secondary | ICD-10-CM | POA: Diagnosis not present

## 2020-07-13 DIAGNOSIS — H04201 Unspecified epiphora, right lacrimal gland: Secondary | ICD-10-CM | POA: Diagnosis not present

## 2020-07-13 DIAGNOSIS — J3489 Other specified disorders of nose and nasal sinuses: Secondary | ICD-10-CM | POA: Diagnosis not present

## 2020-08-03 ENCOUNTER — Other Ambulatory Visit: Payer: Self-pay | Admitting: Osteopathic Medicine

## 2020-08-03 DIAGNOSIS — Z1231 Encounter for screening mammogram for malignant neoplasm of breast: Secondary | ICD-10-CM

## 2020-08-29 DIAGNOSIS — H90A31 Mixed conductive and sensorineural hearing loss, unilateral, right ear with restricted hearing on the contralateral side: Secondary | ICD-10-CM | POA: Diagnosis not present

## 2020-08-29 DIAGNOSIS — Z8669 Personal history of other diseases of the nervous system and sense organs: Secondary | ICD-10-CM | POA: Diagnosis not present

## 2020-08-29 DIAGNOSIS — H9311 Tinnitus, right ear: Secondary | ICD-10-CM | POA: Diagnosis not present

## 2020-08-29 DIAGNOSIS — Z9889 Other specified postprocedural states: Secondary | ICD-10-CM | POA: Diagnosis not present

## 2020-08-29 DIAGNOSIS — H9201 Otalgia, right ear: Secondary | ICD-10-CM | POA: Diagnosis not present

## 2020-08-29 DIAGNOSIS — R2689 Other abnormalities of gait and mobility: Secondary | ICD-10-CM | POA: Diagnosis not present

## 2020-08-29 DIAGNOSIS — H938X1 Other specified disorders of right ear: Secondary | ICD-10-CM | POA: Diagnosis not present

## 2020-08-29 DIAGNOSIS — Q165 Congenital malformation of inner ear: Secondary | ICD-10-CM | POA: Diagnosis not present

## 2020-08-29 DIAGNOSIS — H903 Sensorineural hearing loss, bilateral: Secondary | ICD-10-CM | POA: Diagnosis not present

## 2020-08-29 DIAGNOSIS — H9390 Unspecified disorder of ear, unspecified ear: Secondary | ICD-10-CM | POA: Diagnosis not present

## 2020-08-29 DIAGNOSIS — G932 Benign intracranial hypertension: Secondary | ICD-10-CM | POA: Diagnosis not present

## 2020-08-30 ENCOUNTER — Ambulatory Visit: Payer: BC Managed Care – PPO | Admitting: Gastroenterology

## 2020-08-30 ENCOUNTER — Encounter: Payer: Self-pay | Admitting: Gastroenterology

## 2020-08-30 VITALS — BP 120/72 | HR 80 | Ht 67.0 in | Wt 272.1 lb

## 2020-08-30 DIAGNOSIS — R195 Other fecal abnormalities: Secondary | ICD-10-CM

## 2020-08-30 DIAGNOSIS — R1011 Right upper quadrant pain: Secondary | ICD-10-CM

## 2020-08-30 MED ORDER — CLENPIQ 10-3.5-12 MG-GM -GM/160ML PO SOLN: 1.0000 | Freq: Once | ORAL | 0 refills | Status: AC

## 2020-08-30 NOTE — Progress Notes (Signed)
Chief Complaint: Positive Cologuard test, RUQ pain  Referring Provider:     Emeterio Reeve, DO   HPI:     Morgan Maddox is a 59 y.o. female with a history of pseudotumor cerebri s/p stent 2018 and surgery 2020, anxiety, heart palpitations, obesity (BMI 43), referred to the [facility]ology [facility] for evaluation for colon cancer screening in the setting of positive Cologuard test.  No previous colonoscopy.  No known history of colon cancer, IBD, GI malignancy.  No recent hematochezia.  Reports history of teratoma removed at age 59 and again 2 more intra-abdominal surgeries in her teens/20s, then has had LOA x1.  Separately, she endorses intermittent RUQ pain.  Can occur at anytime of day.  Not related to p.o. intake, food types, activity, etc.  Pain has been present for the last 3-4 weeks or so.  No associated fever, chills, heartburn, regurgitation, nausea/vomiting.  Has been seen by Cardiology.  Separately, she reports issue with sedation for LP 03/30/19 (sedation: Versed 2 mg, fentanyl 100 mcg, Toradol 30 mg).  Reports postoperative agitation.  Since that time, underwent mastoidectomy on 08/18/2019 (general anesthesia with RSI.  Sedation: Versed 2 mg, fentanyl 200 mcg, propofol 200 mg.  Also received Zofran 4 mg, scopolamine patch)    Past Medical History:  Diagnosis Date  . Anxiety   . Bilateral ovarian cysts   . Chronic headache   . Family history of premature CAD    Brother had an MI, sister has CAD. Mother had an MI at age 56. Still alive at 32.  [name] History of mononucleosis 04/21/2013  . History of sinus surgery 04/21/2013  . Hypertriglyceridemia 04/21/2013  . Morbid obesity with BMI of 40.0-44.9, adult ([place])    BMI roughly 43 noted on 02/09/2016 admission [name])  . 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 Obesity   . Pseudotumor cerebri 08/2017   Idiopathic intracerebral hypertension -> s/p right transverse sinus stenting  . Vitamin D deficiency       Past Surgical History:  Procedure Laterality Date  . MASTOIDECTOMY    . NASAL SINUS SURGERY    . NM MYOVIEW LTD  02/09/2016   : EF 76%. Periapical defect with partial improvement during rest. No wall motion abnormality noted. Likely related to breast attenuation, cannot exclude small area of periapical ischemia. --> Cardiac catheterization not recommended. LOW RISK  . OVARIAN CYST SURGERY    . TONSILLECTOMY    . Transverse Sinus Stent  07/2017   [facility] - > for IHH/pseudotumor cerebri   Family History  Problem Relation Age of Onset  . Heart failure Mother        Long-standing CAD  . Heart attack Mother        First MI in 33s  . Coronary artery disease Mother   . Diabetes Mother   . Colon polyps Mother   . Coronary artery disease Sister 60  . Heart attack Brother        In his 65s  . Coronary artery disease Brother   . Colon cancer Neg Hx   . Esophageal cancer Neg Hx    Social History   Tobacco Use  . Smoking status: Former Smoker    Packs/day: 0.50    Years: 25.00    Pack years: 12.50    Types: Cigarettes  . Smokeless tobacco: Never Used  Vaping Use  . Vaping Use: Never used  Substance Use Topics  .  Alcohol use: Yes    Alcohol/week: 0.0 standard drinks    Comment: social  . Drug use: No   Current Outpatient Medications  Medication Sig Dispense Refill  . aspirin 81 MG chewable tablet Chew 81 mg by mouth daily.    . clobetasol (TEMOVATE) 0.05 % external solution Apply 1 application topically as needed.     . desonide (DESOWEN) 0.05 % cream Apply 1 application topically as needed.     . furosemide (LASIX) 20 MG tablet Take 20 mg by mouth daily as needed.     [name] ketoconazole (NIZORAL) 2 % shampoo Apply 1 application topically as needed.     . traZODone (DESYREL) 50 MG tablet Take 1 tablet (50 mg total) by mouth at bedtime as needed for sleep. 90 tablet 1   No current facility-administered medications for this visit.   Allergies  Allergen Reactions   . Calamine Rash  . Pramoxine-Calamine Rash and Other (See Comments)  . Propofol Nausea And Vomiting    EXTREME NAUSEA & VOMITING! See 10/17/2017 surgical record for anesthesia    . Diphenhydramine Hcl Rash     Review of Systems: All systems reviewed and negative except where noted in HPI.     Physical Exam:    Wt Readings from Last 3 Encounters:  08/30/20 272 lb 2 oz (123.4 kg)  06/06/20 (!) 258 lb 9.6 oz (117.3 kg)  05/02/20 266 lb (120.7 kg)    BP 120/72   Pulse 80   Ht 5\' 7"  (1.702 m)   Wt 272 lb 2 oz (123.4 kg)   BMI 42.62 kg/m  Constitutional:  Pleasant, in no acute distress. Psychiatric: Normal mood and affect. Behavior is normal. EENT: Pupils normal.  Conjunctivae are normal. No scleral icterus. Neck supple. No cervical LAD. Cardiovascular: Normal rate, regular rhythm. No edema Pulmonary/chest: Effort normal and breath sounds normal. No wheezing, rales or rhonchi. Abdominal: Soft, nondistended, nontender. Bowel sounds active throughout. There are no masses palpable. No hepatomegaly. Neurological: Alert and oriented to person place and time. Skin: Skin is warm and dry. No rashes noted.   ASSESSMENT AND PLAN;   1) Positive Cologuard test -Scheduled colonoscopy  2) RUQ pain: -RUQ ultrasound -Check liver enzymes -If unrevealing, could consider HIDA  3) Agitation with sedation: -Reports agitation with sedation that she received for lumbar puncture in 03/30/2019.  Since that time, has had sedation, to include propofol, with no reported issues by patient or in review of EMR  The indications, risks, and benefits of colonoscopy were explained to the patient in detail. Risks include but are not limited to bleeding, perforation, adverse reaction to medications, and cardiopulmonary compromise. Sequelae include but are not limited to the possibility of surgery, hospitalization, and mortality. The patient verbalized understanding and wished to proceed. All questions  answered, referred to the scheduler and bowel prep ordered. Further recommendations pending results of the exam.     Lavena Bullion, DO, FACG  08/30/2020, 8:32 AM   Emeterio Reeve, DO

## 2020-08-30 NOTE — Patient Instructions (Signed)
If you are age 58 or older, your body mass index should be between 23-30. Your Body mass index is 42.62 kg/m. If this is out of the aforementioned range listed, please consider follow up with your Primary Care Provider.  If you are age 75 or younger, your body mass index should be between 19-25. Your Body mass index is 42.62 kg/m. If this is out of the aformentioned range listed, please consider follow up with your Primary Care Provider.   You have been scheduled for a colonoscopy. Please follow written instructions given to you at your visit today.  Please pick up your prep supplies at the pharmacy within the next 1-3 days. If you use inhalers (even only as needed), please bring them with you on the day of your procedure.  Please go to the lab at [facility] ([place] You will need to go to level "B", you do not need an appointment for this. Hours available are 7:30 am - 4:30 pm.    You have been scheduled for an abdominal ultrasound at [facility] (1st floor [address] ) on 09/05/20 at 10am. Please arrive 15 minutes prior to your appointment for registration. Make certain not to have anything to eat or drink 6 hours prior to your appointment. Should you need to reschedule your appointment, please contact radiology at [phone]. This test typically takes about 30 minutes to perform.  We have given you samples of the following medication to take: Clenpiq  It was a pleasure to see you today!  Vito Cirigliano, D.O.

## 2020-09-02 ENCOUNTER — Other Ambulatory Visit: Payer: Self-pay

## 2020-09-02 MED ORDER — TRAZODONE HCL 50 MG PO TABS: 50.0000 mg | ORAL_TABLET | Freq: Every evening | ORAL | 0 refills | Status: DC | PRN

## 2020-09-05 ENCOUNTER — Ambulatory Visit (HOSPITAL_BASED_OUTPATIENT_CLINIC_OR_DEPARTMENT_OTHER)
Admission: RE | Admit: 2020-09-05 | Discharge: 2020-09-05 | Disposition: A | Discharge status: Home / Self Care | Payer: BC Managed Care – PPO | Source: Ambulatory Visit | Attending: Gastroenterology | Admitting: Gastroenterology

## 2020-09-05 ENCOUNTER — Other Ambulatory Visit: Payer: Self-pay

## 2020-09-05 DIAGNOSIS — R1011 Right upper quadrant pain: Secondary | ICD-10-CM | POA: Insufficient documentation

## 2020-09-05 DIAGNOSIS — K760 Fatty (change of) liver, not elsewhere classified: Secondary | ICD-10-CM | POA: Diagnosis not present

## 2020-09-09 ENCOUNTER — Encounter: Payer: Self-pay | Admitting: Gastroenterology

## 2020-09-09 ENCOUNTER — Other Ambulatory Visit: Payer: Self-pay

## 2020-09-09 ENCOUNTER — Telehealth: Payer: Self-pay | Admitting: General Surgery

## 2020-09-09 ENCOUNTER — Ambulatory Visit (AMBULATORY_SURGERY_CENTER): Payer: BC Managed Care – PPO | Admitting: Gastroenterology

## 2020-09-09 VITALS — BP 115/70 | HR 75 | Temp 96.9°F | Resp 14 | Ht 67.0 in | Wt 272.0 lb

## 2020-09-09 DIAGNOSIS — D127 Benign neoplasm of rectosigmoid junction: Secondary | ICD-10-CM | POA: Diagnosis not present

## 2020-09-09 DIAGNOSIS — R1010 Upper abdominal pain, unspecified: Secondary | ICD-10-CM

## 2020-09-09 DIAGNOSIS — D120 Benign neoplasm of cecum: Secondary | ICD-10-CM | POA: Diagnosis not present

## 2020-09-09 DIAGNOSIS — R195 Other fecal abnormalities: Secondary | ICD-10-CM | POA: Diagnosis not present

## 2020-09-09 DIAGNOSIS — K640 First degree hemorrhoids: Secondary | ICD-10-CM

## 2020-09-09 DIAGNOSIS — D125 Benign neoplasm of sigmoid colon: Secondary | ICD-10-CM

## 2020-09-09 MED ORDER — SODIUM CHLORIDE 0.9 % IV SOLN: 500.0000 mL | Freq: Once | INTRAVENOUS | Status: DC

## 2020-09-09 NOTE — Progress Notes (Signed)
Pt's states no medical or surgical changes since previsit or office visit. 

## 2020-09-09 NOTE — Progress Notes (Signed)
To PACU, VSS. Report to [email or web address] 

## 2020-09-09 NOTE — Patient Instructions (Signed)
Handouts given:  Polyps, Hemorrhoids Resume previous diet  continue current medications Await pathology results  YOU HAD AN ENDOSCOPIC PROCEDURE TODAY AT [facility]:   Refer to the procedure report that was given to you for any specific questions about what was found during the examination.  If the procedure report does not answer your questions, please call your gastroenterologist to clarify.  If you requested that your care partner not be given the details of your procedure findings, then the procedure report has been included in a sealed envelope for you to review at your convenience later.  YOU SHOULD EXPECT: Some feelings of bloating in the abdomen. Passage of more gas than usual.  Walking can help get rid of the air that was put into your GI tract during the procedure and reduce the bloating. If you had a lower endoscopy (such as a colonoscopy or flexible sigmoidoscopy) you may notice spotting of blood in your stool or on the toilet paper. If you underwent a bowel prep for your procedure, you may not have a normal bowel movement for a few days.  Please Note:  You might notice some irritation and congestion in your nose or some drainage.  This is from the oxygen used during your procedure.  There is no need for concern and it should clear up in a day or so.  SYMPTOMS TO REPORT IMMEDIATELY:   Following lower endoscopy (colonoscopy or flexible sigmoidoscopy):  Excessive amounts of blood in the stool  Significant tenderness or worsening of abdominal pains  Swelling of the abdomen that is new, acute  Fever of 100F or higher  For urgent or emergent issues, a gastroenterologist can be reached at any hour by calling [phone]. Do not use MyChart messaging for urgent concerns.    DIET:  We do recommend a small meal at first, but then you may proceed to your regular diet.  Drink plenty of fluids but you should avoid alcoholic beverages for 24 hours.  ACTIVITY:  You  should plan to take it easy for the rest of today and you should NOT DRIVE or use heavy machinery until tomorrow (because of the sedation medicines used during the test).    FOLLOW UP: Our staff will call the number listed on your records 48-72 hours following your procedure to check on you and address any questions or concerns that you may have regarding the information given to you following your procedure. If we do not reach you, we will leave a message.  We will attempt to reach you two times.  During this call, we will ask if you have developed any symptoms of COVID 19. If you develop any symptoms (ie: fever, flu-like symptoms, shortness of breath, cough etc.) before then, please call [phone].  If you test positive for Covid 19 in the 2 weeks post procedure, please call and report this information to [place].    If any biopsies were taken you will be contacted by phone or by letter within the next 1-3 weeks.  Please call us at [phone] if you have not heard about the biopsies in 3 weeks.    SIGNATURES/CONFIDENTIALITY: You and/or your care partner have signed paperwork which will be entered into your electronic medical record.  These signatures attest to the fact that that the information above on your After Visit Summary has been reviewed and is understood.  Full responsibility of the confidentiality of this discharge information lies with you and/or your care-partner.

## 2020-09-09 NOTE — Progress Notes (Signed)
Called to room to assist during endoscopic procedure.  Patient ID and intended procedure confirmed with present staff. Received instructions for my participation in the procedure from the performing physician.  

## 2020-09-09 NOTE — Telephone Encounter (Signed)
Left a voicemail for patient to contact the office [place] over [place] results.

## 2020-09-09 NOTE — Telephone Encounter (Signed)
Patient contacted the office and we went over her ultrasound. She is having labs drawn in 3 weeks at her PCP for her annual and will have him add the liver enzymes. Patient verbalized understanding.

## 2020-09-09 NOTE — Op Note (Signed)
[place] Patient Name: Morgan Maddox Procedure Date: 09/09/2020 9:42 AM MRN: [id number] Endoscopist: Gerrit Heck , MD Age: 59 Referring MD:  Date of Birth: 07-16-1961 Gender: Female Account #: [id number] Procedure:                Colonoscopy Indications:              This is the patient's first colonoscopy, Positive                            Cologuard test Medicines:                Monitored Anesthesia Care Procedure:                Pre-Anesthesia Assessment:                           - Prior to the procedure, a History and Physical                            was performed, and patient medications and                            allergies were reviewed. The patient's tolerance of                            previous anesthesia was also reviewed. The risks                            and benefits of the procedure and the sedation                            options and risks were discussed with the patient.                            All questions were answered, and informed consent                            was obtained. Prior Anticoagulants: The patient has                            taken no previous anticoagulant or antiplatelet                            agents. ASA Grade Assessment: II - A patient with                            mild systemic disease. After reviewing the risks                            and benefits, the patient was deemed in                            satisfactory condition to undergo the procedure.  After obtaining informed consent, the colonoscope                            was passed under direct vision. Throughout the                            procedure, the patient's blood pressure, pulse, and                            oxygen saturations were monitored continuously. The                            Colonoscope was introduced through the anus and                            advanced to the the terminal ileum. The  colonoscopy                            was technically difficult and complex due to                            significant looping. Successful completion of the                            procedure was aided by applying abdominal pressure.                            The patient tolerated the procedure well. The                            quality of the bowel preparation was good. The                            terminal ileum, ileocecal valve, appendiceal                            orifice, and rectum were photographed. Scope In: 10:22:06 AM Scope Out: 47:82:95 AM Scope Withdrawal Time: 0 hours 19 minutes 52 seconds  Total Procedure Duration: 0 hours 34 minutes 12 seconds  Findings:                 Skin tags were found on perianal exam.                           Two sessile polyps were found in the sigmoid colon                            and cecum. The polyps were 4 and 8 mm in size.                            These polyps were removed with a cold snare.                            Resection and  retrieval were complete. Estimated                            blood loss was minimal.                           Three sessile polyps were found in the                            recto-sigmoid colon. The polyps were 2 to 4 mm in                            size. These polyps were removed with a cold snare.                            Resection and retrieval were complete. Estimated                            blood loss was minimal.                           Non-bleeding internal hemorrhoids were found during                            retroflexion. The hemorrhoids were small and Grade                            I (internal hemorrhoids that do not prolapse).                           The ascending colon revealed significantly                            excessive looping. Advancing the scope required                            using manual pressure.                           The terminal ileum appeared  normal. Complications:            No immediate complications. Estimated Blood Loss:     Estimated blood loss was minimal. Impression:               - Perianal skin tags found on perianal exam.                           - Two 4 to 8 mm polyps in the sigmoid colon and in                            the cecum, removed with a cold snare. Resected and                            retrieved.                           -  Three 2 to 4 mm polyps at the recto-sigmoid                            colon, removed with a cold snare. Resected and                            retrieved.                           - Non-bleeding internal hemorrhoids.                           - There was significant looping of the colon.                           - The examined portion of the ileum was normal. Recommendation:           - Patient has a contact number available for                            emergencies. The signs and symptoms of potential                            delayed complications were discussed with the                            patient. Return to normal activities tomorrow.                            Written discharge instructions were provided to the                            patient.                           - Resume previous diet.                           - Continue present medications.                           - Await pathology results.                           - Repeat colonoscopy for surveillance based on                            pathology results.                           - Return to GI office PRN. Gerrit Heck, MD 09/09/2020 11:04:05 AM

## 2020-09-09 NOTE — Telephone Encounter (Signed)
-----   Message from [place], DO sent at 09/07/2020  5:15 PM EDT ----- Ultrasound consistent with fatty infiltration of the liver (fatty liver disease), but otherwise was normal appearing, to include normal appearing gall bladder and bile ducts. Had previously ordered liver enzymes, but not yet done. Please check on the status of her going to lab. If enzymes abnormal or sxs recur, will plan on HIDA scan.

## 2020-09-13 ENCOUNTER — Telehealth: Payer: Self-pay

## 2020-09-13 NOTE — Telephone Encounter (Signed)
°  Follow up Call-  Call back number 09/09/2020  Post procedure Call Back phone  # [phone]  Permission to leave phone message Yes  Some recent data might be hidden     Patient questions:  Do you have a fever, pain , or abdominal swelling? No. Pain Score  0 *  Have you tolerated food without any problems? Yes.    Have you been able to return to your normal activities? Yes.    Do you have any questions about your discharge instructions: Diet   No. Medications  No. Follow up visit  No.  Do you have questions or concerns about your Care? No.  Actions: * If pain score is 4 or above: No action needed, pain <4.  1. Have you developed a fever since your procedure? no  2.   Have you had an respiratory symptoms (SOB or cough) since your procedure? no  3.   Have you tested positive for COVID 19 since your procedure no  4.   Have you had any family members/close contacts diagnosed with the COVID 19 since your procedure?  no   If yes to any of these questions please route to Joylene John, RN and Joella Prince, RN

## 2020-09-14 ENCOUNTER — Other Ambulatory Visit: Payer: Self-pay

## 2020-09-14 ENCOUNTER — Ambulatory Visit (INDEPENDENT_AMBULATORY_CARE_PROVIDER_SITE_OTHER): Payer: BC Managed Care – PPO

## 2020-09-14 DIAGNOSIS — Z1231 Encounter for screening mammogram for malignant neoplasm of breast: Secondary | ICD-10-CM

## 2020-09-20 ENCOUNTER — Encounter: Payer: Self-pay | Admitting: Gastroenterology

## 2020-10-05 ENCOUNTER — Ambulatory Visit (INDEPENDENT_AMBULATORY_CARE_PROVIDER_SITE_OTHER): Payer: BC Managed Care – PPO

## 2020-10-05 ENCOUNTER — Other Ambulatory Visit: Payer: Self-pay

## 2020-10-05 ENCOUNTER — Ambulatory Visit (INDEPENDENT_AMBULATORY_CARE_PROVIDER_SITE_OTHER): Payer: BC Managed Care – PPO | Admitting: Sports Medicine

## 2020-10-05 DIAGNOSIS — M1732 Unilateral post-traumatic osteoarthritis, left knee: Secondary | ICD-10-CM

## 2020-10-05 DIAGNOSIS — M1712 Unilateral primary osteoarthritis, left knee: Secondary | ICD-10-CM | POA: Diagnosis not present

## 2020-10-05 DIAGNOSIS — M1711 Unilateral primary osteoarthritis, right knee: Secondary | ICD-10-CM | POA: Diagnosis not present

## 2020-10-05 MED ORDER — CELECOXIB 200 MG PO CAPS: ORAL_CAPSULE | ORAL | 2 refills | Status: DC

## 2020-10-05 NOTE — Progress Notes (Addendum)
    Procedures performed today:    Procedure: Real-time Ultrasound Guided injection of the left knee Device: Samsung HS60  Verbal informed consent obtained.  Time-out conducted.  Noted no overlying erythema, induration, or other signs of local infection.  Skin prepped in a sterile fashion.  Local anesthesia: Topical Ethyl chloride.  With sterile technique and under real time ultrasound guidance: 1 cc Kenalog 40, 2 cc lidocaine, 2 cc bupivacaine injected easily Completed without difficulty  Advised to call if fevers/chills, erythema, induration, drainage, or persistent bleeding.  Images permanently stored and available for review in PACS.  Impression: Technically successful ultrasound guided injection.  Independent interpretation of notes and tests performed by another provider:   None.  Brief History, Exam, Impression, and Recommendations:    Post-traumatic osteoarthritis of left knee History of patellar fracture, osteoarthritis with intra-articular loose bodies, morbidly obese. She has been doing ibuprofen without much improvement, and is starting to get a little bit of epigastric pain, switching to Celebrex, adding formal physical therapy, baseline x-rays, injected today, return to see me in a month, will consider viscosupplementation if no better. We also discussed bariatric surgery, as well as medications, she will discuss Wegovy with her PCP.  Update: Patient still in significant pain post injection and sounds to be having some dyspepsia, discontinue Celebrex, I would like her to switch to arthritis strength Tylenol, 650 mg p.o. 3 times daily, this is over-the-counter, I will also start working on getting her approved for viscosupplementation.  Aggressive weight loss is going to be crucial here, 50 to 75 pounds if able could potentially save her knees.    ___________________________________________ Gwen Her. Dianah Field, M.D., ABFM., CAQSM. Primary Care and [place] In[employer] of [employer] [facility]

## 2020-10-05 NOTE — Patient Instructions (Signed)
Look into [facility] and gastric sleeve

## 2020-10-05 NOTE — Assessment & Plan Note (Addendum)
History of patellar fracture, osteoarthritis with intra-articular loose bodies, morbidly obese. She has been doing ibuprofen without much improvement, and is starting to get a little bit of epigastric pain, switching to Celebrex, adding formal physical therapy, baseline x-rays, injected today, return to see me in a month, will consider viscosupplementation if no better. We also discussed bariatric surgery, as well as medications, she will discuss Wegovy with her PCP.  Update: Patient still in significant pain post injection and sounds to be having some dyspepsia, discontinue Celebrex, I would like her to switch to arthritis strength Tylenol, 650 mg p.o. 3 times daily, this is over-the-counter, I will also start working on getting her approved for viscosupplementation.  Aggressive weight loss is going to be crucial here, 50 to 75 pounds if able could potentially save her knees.

## 2020-10-10 ENCOUNTER — Telehealth: Payer: Self-pay | Admitting: Sports Medicine

## 2020-10-10 MED ORDER — ACETAMINOPHEN ER 650 MG PO TBCR: 650.0000 mg | EXTENDED_RELEASE_TABLET | Freq: Three times a day (TID) | ORAL | 3 refills | Status: DC | PRN

## 2020-10-10 NOTE — Addendum Note (Signed)
Addended by: Silverio Decamp on: 10/10/2020 09:12 AM   Modules accepted: Orders

## 2020-10-10 NOTE — Telephone Encounter (Signed)
Lets work on [employer], left knee, failed everything including steroid injections, x-ray confirmed.

## 2020-10-11 ENCOUNTER — Other Ambulatory Visit: Payer: Self-pay

## 2020-10-11 ENCOUNTER — Encounter: Payer: Self-pay | Admitting: Osteopathic Medicine

## 2020-10-11 ENCOUNTER — Ambulatory Visit (INDEPENDENT_AMBULATORY_CARE_PROVIDER_SITE_OTHER): Payer: BC Managed Care – PPO | Admitting: Osteopathic Medicine

## 2020-10-11 VITALS — BP 143/93 | HR 71 | Temp 98.1°F | Wt 267.0 lb

## 2020-10-11 DIAGNOSIS — E559 Vitamin D deficiency, unspecified: Secondary | ICD-10-CM | POA: Diagnosis not present

## 2020-10-11 DIAGNOSIS — Z0000 Encounter for general adult medical examination without abnormal findings: Secondary | ICD-10-CM

## 2020-10-11 DIAGNOSIS — R7303 Prediabetes: Secondary | ICD-10-CM

## 2020-10-11 DIAGNOSIS — R350 Frequency of micturition: Secondary | ICD-10-CM | POA: Diagnosis not present

## 2020-10-11 NOTE — Patient Instructions (Signed)
General Preventive Care  Most recent routine screening labs: ordered today.   Blood pressure goal 130/80 or less.   Tobacco: don't!   Alcohol: responsible moderation is ok for most adults - if you have concerns about your alcohol intake, please talk to me!   Exercise: as tolerated to reduce risk of cardiovascular disease and diabetes. Strength training will also prevent osteoporosis.   Mental health: if need for mental health care (medicines, counseling, other), or concerns about moods, please let me know!   Sexual / Reproductive health: if need for STD testing, or if concerns with libido/pain problems, please let me know!  Advanced Directive: Living Will and/or Healthcare Power of Attorney recommended for all adults, regardless of age or health.  Vaccines  Flu vaccine: recommended every fall.   Shingles vaccine: recommended after age 71.   Pneumonia vaccines: after age 63.  Tetanus booster: every 10 years - will NEED to update this is deep cut, puncture wound, bite wound   COVID vaccine: THANKS for getting your vaccine! :)  Cancer screenings   Colon cancer screening: for everyone age 27-75. Colonoscopy due 08/2027.  Breast cancer screening: mammogram due 09/2021.   Cervical cancer screening: Pap due 09/2023.   Lung cancer screening: not needed, no heavy smoking history  Infection screenings  . HIV: recommended screening done, repeat as needed. . Gonorrhea/Chlamydia: screening as needed . Hepatitis C: recommended screening done, repeat as needed . TB: certain at-risk populations, or depending on work requirements and/or travel history Other . Bone Density Test: recommended at age 65

## 2020-10-11 NOTE — Progress Notes (Signed)
Morgan Maddox is a 59 y.o. female who presents to  [place] at [facility]  today, 10/11/20, seeking care for the following:  . Annual physical  . Noted BP above goal in office  . Questions about weight loss and fatty liver . Pt feeling she "needs a break" from doctors and medications, wants to work on weight loss and strengthening the knee.   BP Readings from Last 3 Encounters:  10/11/20 (!) 143/93  09/09/20 115/70  08/30/20 120/72     ASSESSMENT & PLAN with other pertinent findings:  The primary encounter diagnosis was Annual physical exam. Diagnoses of Prediabetes, Vitamin D deficiency, and Urinary frequency were also pertinent to this visit.   No results found for this or any previous visit (from the past 24 hour(s)).  Discussed dietary modifications for weight loss Discussed knee compression sleeve/brace, rehab Watch BP at home, discussed goals    Patient Instructions  General Preventive Care  Most recent routine screening labs: ordered today.   Blood pressure goal 130/80 or less.   Tobacco: don't!   Alcohol: responsible moderation is ok for most adults - if you have concerns about your alcohol intake, please talk to me!   Exercise: as tolerated to reduce risk of cardiovascular disease and diabetes. Strength training will also prevent osteoporosis.   Mental health: if need for mental health care (medicines, counseling, other), or concerns about moods, please let me know!   Sexual / Reproductive health: if need for STD testing, or if concerns with libido/pain problems, please let me know!  Advanced Directive: Living Will and/or Healthcare Power of Attorney recommended for all adults, regardless of age or health.  Vaccines  Flu vaccine: recommended every fall.   Shingles vaccine: recommended after age 62.   Pneumonia vaccines: after age 15.  Tetanus booster: every 10 years - will NEED to update this is deep cut,  puncture wound, bite wound   COVID vaccine: THANKS for getting your vaccine! :)  Cancer screenings   Colon cancer screening: for everyone age 3-75. Colonoscopy due 08/2027.  Breast cancer screening: mammogram due 09/2021.   Cervical cancer screening: Pap due 09/2023.   Lung cancer screening: not needed, no heavy smoking history  Infection screenings  . HIV: recommended screening done, repeat as needed. . Gonorrhea/Chlamydia: screening as needed . Hepatitis C: recommended screening done, repeat as needed . TB: certain at-risk populations, or depending on work requirements and/or travel history Other . Bone Density Test: recommended at age 5   Orders Placed This Encounter  Procedures  . Urine Culture  . CBC  . COMPLETE METABOLIC PANEL WITH GFR  . Lipid panel  . Hemoglobin A1c  . VITAMIN D 25 Hydroxy (Vit-D Deficiency, Fractures)  . Urinalysis, Routine w reflex microscopic    No orders of the defined types were placed in this encounter.      Follow-up instructions: Return for RECHECK PENDING RESULTS / IF NORMAL PLAN ON ANNUAL IN ONE YEAR .                                         BP (!) 143/93 (BP Location: Left Arm, Patient Position: Sitting, Cuff Size: Large)   Pulse 71   Temp 98.1 F (36.7 C) (Oral)   Wt 267 lb 0.6 oz (121.1 kg)   BMI 41.82 kg/m   Current Meds  Medication  Sig  . acetaminophen (TYLENOL) 650 MG CR tablet Take 1 tablet (650 mg total) by mouth every 8 (eight) hours as needed for pain.  [name] aspirin 81 MG chewable tablet Chew 81 mg by mouth daily.  . clobetasol (TEMOVATE) 0.05 % external solution Apply 1 application topically as needed.   . desonide (DESOWEN) 0.05 % cream Apply 1 application topically as needed.   . furosemide (LASIX) 20 MG tablet Take 20 mg by mouth daily as needed.   [name] ketoconazole (NIZORAL) 2 % shampoo Apply 1 application topically as needed.   . traZODone (DESYREL) 50 MG tablet Take 1 tablet  (50 mg total) by mouth at bedtime as needed for sleep.    No results found for this or any previous visit (from the past 72 hour(s)).  No results found.  Constitutional:  . VSS, see nurse notes . General Appearance: alert, well-developed, well-nourished, NAD Eyes: [name] Normal lids and conjunctive, non-icteric sclera Neck: . No masses, trachea midline . No thyroid enlargement/tenderness/mass appreciated Respiratory: . Normal respiratory effort . Breath sounds normal, no wheeze/rhonchi/rales Cardiovascular: . S1/S2 normal, no murmur/rub/gallop auscultated . No lower extremity edema Gastrointestinal: . Nontender, no masses . No hepatomegaly, no splenomegaly . No hernia appreciated Musculoskeletal:  . Gait normal . No clubbing/cyanosis of digits Neurological: . No cranial nerve deficit on limited exam . Motor and sensation intact and symmetric Psychiatric: . Normal judgment/insight . Normal mood and affect    All questions at time of visit were answered - patient instructed to contact office with any additional concerns or updates.  ER/RTC precautions were reviewed with the patient as applicable.   Please note: voice recognition software was used to produce this document, and typos may escape review. Please contact Dr. Sheppard Coil for any needed clarifications.

## 2020-10-12 LAB — CBC
HCT: 41.9 % (ref 35.0–45.0)
Hemoglobin: 14.1 g/dL (ref 11.7–15.5)
MCH: 29.2 pg (ref 27.0–33.0)
MCHC: 33.7 g/dL (ref 32.0–36.0)
MCV: 86.7 fL (ref 80.0–100.0)
MPV: 9.7 fL (ref 7.5–12.5)
Platelets: 349 10*3/uL (ref 140–400)
RBC: 4.83 10*6/uL (ref 3.80–5.10)
RDW: 12.3 % (ref 11.0–15.0)
WBC: 7.1 10*3/uL (ref 3.8–10.8)

## 2020-10-12 LAB — COMPLETE METABOLIC PANEL WITH GFR
AG Ratio: 1.6 (calc) (ref 1.0–2.5)
ALT: 11 U/L (ref 6–29)
AST: 12 U/L (ref 10–35)
Albumin: 4.4 g/dL (ref 3.6–5.1)
Alkaline phosphatase (APISO): 95 U/L (ref 37–153)
BUN: 13 mg/dL (ref 7–25)
CO2: 29 mmol/L (ref 20–32)
Calcium: 9.5 mg/dL (ref 8.6–10.4)
Chloride: 102 mmol/L (ref 98–110)
Creat: 0.78 mg/dL (ref 0.50–1.05)
GFR, Est African American: 97 mL/min/{1.73_m2} (ref >=60)
GFR, Est Non African American: 84 mL/min/{1.73_m2} (ref >=60)
Globulin: 2.8 g/dL (calc) (ref 1.9–3.7)
Glucose, Bld: 86 mg/dL (ref 65–99)
Potassium: 4.4 mmol/L (ref 3.5–5.3)
Sodium: 141 mmol/L (ref 135–146)
Total Bilirubin: 0.6 mg/dL (ref 0.2–1.2)
Total Protein: 7.2 g/dL (ref 6.1–8.1)

## 2020-10-12 LAB — HEMOGLOBIN A1C
Hgb A1c MFr Bld: 5.8 % of total Hgb — ABNORMAL HIGH (ref <5.7)
Mean Plasma Glucose: 120 (calc)
eAG (mmol/L): 6.6 (calc)

## 2020-10-12 LAB — LIPID PANEL
Cholesterol: 209 mg/dL — ABNORMAL HIGH (ref <200)
HDL: 96 mg/dL (ref >=50)
LDL Cholesterol (Calc): 95 mg/dL (calc)
Non-HDL Cholesterol (Calc): 113 mg/dL (calc) (ref <130)
Total CHOL/HDL Ratio: 2.2 (calc) (ref <5.0)
Triglycerides: 90 mg/dL (ref <150)

## 2020-10-12 LAB — VITAMIN D 25 HYDROXY (VIT D DEFICIENCY, FRACTURES): Vit D, 25-Hydroxy: 30 ng/mL (ref 30–100)

## 2020-10-13 LAB — URINALYSIS, ROUTINE W REFLEX MICROSCOPIC
Bacteria, UA: NONE SEEN /HPF
Bilirubin Urine: NEGATIVE
Glucose, UA: NEGATIVE
Ketones, ur: NEGATIVE
Leukocytes,Ua: NEGATIVE
Nitrite: NEGATIVE
Protein, ur: NEGATIVE
Specific Gravity, Urine: 1.021 (ref 1.001–1.03)
pH: 5.5 (ref 5.0–8.0)

## 2020-10-13 LAB — URINE CULTURE
MICRO NUMBER:: 11260612
SPECIMEN QUALITY:: ADEQUATE

## 2020-10-14 NOTE — Telephone Encounter (Signed)
Submitted orthovisc waiting on BID and to see if it requires PA - CF

## 2020-10-24 ENCOUNTER — Other Ambulatory Visit: Payer: Self-pay

## 2020-10-24 ENCOUNTER — Ambulatory Visit (INDEPENDENT_AMBULATORY_CARE_PROVIDER_SITE_OTHER): Payer: BC Managed Care – PPO | Admitting: Physical Therapy

## 2020-10-24 DIAGNOSIS — M25562 Pain in left knee: Secondary | ICD-10-CM | POA: Diagnosis not present

## 2020-10-24 DIAGNOSIS — M6281 Muscle weakness (generalized): Secondary | ICD-10-CM | POA: Diagnosis not present

## 2020-10-24 NOTE — Patient Instructions (Signed)
Access Code: [email or web address] URL: [email or web address] Date: 10/24/2020 Prepared by: Almyra Free  Exercises Supine Hamstring Stretch with Strap - 2 x daily - 7 x weekly - 1 sets - 3 reps - 60 sec hold Sidelying TFL Stretch - 2 x daily - 7 x weekly - 1 sets - 3 reps - 60 sec hold Standing Ankle Dorsiflexion Stretch - 2 x daily - 7 x weekly - 1 sets - 3 reps - 60 sec hold Seated Hip Flexor Stretch - 2 x daily - 7 x weekly - 1 sets - 3 reps - 30-60 sec hold

## 2020-10-24 NOTE — Therapy (Signed)
[place], [place], [address] Phone: [phone]   Fax:  [phone]  Physical Therapy Treatment  Patient Details  Name: Morgan Maddox MRN: [id number] Date of Birth: 20-Oct-1961 Referring Provider (PT): Aundria Mems MD   Encounter Date: 10/24/2020   PT End of Session - 10/24/20 1450    Visit Number 1    Date for PT Re-Evaluation 12/05/20    Authorization Type BCBS    PT Start Time 1440    PT Stop Time 1540    PT Time Calculation (min) 60 min    Activity Tolerance Patient tolerated treatment well    Behavior During Therapy [facility] for tasks assessed/performed;Anxious           Past Medical History:  Diagnosis Date  . Anxiety   . Bilateral ovarian cysts   . Chronic headache   . Family history of premature CAD    Brother had an MI, sister has CAD. Mother had an MI at age 67. Still alive at 30.  [name] History of mononucleosis 04/21/2013  . History of sinus surgery 04/21/2013  . Hypertriglyceridemia 04/21/2013  . Morbid obesity with BMI of 40.0-44.9, adult ([place])    BMI roughly 43 noted on 02/09/2016 admission [name])  . 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 Obesity   . Pseudotumor cerebri 08/2017   Idiopathic intracerebral hypertension -> s/p right transverse sinus stenting  . Vitamin D deficiency     Past Surgical History:  Procedure Laterality Date  . MASTOIDECTOMY    . NASAL SINUS SURGERY    . NM MYOVIEW LTD  02/09/2016   [place]: EF 76%. Periapical defect with partial improvement during rest. No wall motion abnormality noted. Likely related to breast attenuation, cannot exclude small area of periapical ischemia. --> Cardiac catheterization not recommended. LOW RISK  . OVARIAN CYST SURGERY    . TONSILLECTOMY    . Transverse Sinus Stent  07/2017   [facility] - > for IHH/pseudotumor cerebri    There were no vitals filed for this visit.   Subjective Assessment - 10/24/20 1440     Subjective Patient has had knee problems since she was 59 y.o. Pain is in posterior knee now beginning in Oct 2021. It has always been anterior before. she reports incidences of it giving out. Reports 30# weight gain since stopping smoking.    Pertinent History 2 Brain surgeries (IIH) with subsequent dizziness, left patellar fracture, OA left knee, LBP, anxiety    Diagnostic tests xray - mild degnerative changes; loose body in post joint space    Patient Stated Goals no pain, a little stronger    Currently in Pain? Yes    Pain Score 4     Pain Location Knee    Pain Orientation Left    Pain Descriptors / Indicators Aching;Sharp    Pain Type Chronic pain              [facility] PT Assessment - 10/24/20 0001      Assessment   Medical Diagnosis post traumatic OA left knee    Referring Provider (PT) Aundria Mems MD    Onset Date/Surgical Date 09/04/20    Hand Dominance Right    Next MD Visit 4 weeks    Prior Therapy no      Precautions   Precautions None      Restrictions   Weight Bearing Restrictions No      Balance Screen   Has the patient fallen in  the past 6 months No    Has the patient had a decrease in activity level because of a fear of falling?  No    Is the patient reluctant to leave their home because of a fear of falling?  No      Home Environment   Living Environment Private residence    Living Arrangements Alone    Type of [place] Two level    Additional Comments coming down step to; going up fine      Prior Function   Level of Independence Independent    Vocation Full time employment    Vocation Requirements Sits at work; she has a Fish farm manager   Overall Cognitive Status Within Functional Limits for tasks assessed      Observation/Other Assessments   Focus on Therapeutic Outcomes (FOTO)  49% limited      Posture/Postural Control   Posture Comments bil knee valgus      ROM / Strength   AROM / PROM / Strength  AROM;Strength      AROM   Overall AROM Comments BLE WFL    AROM Assessment Site Knee    Right/Left Knee Left    Left Knee Extension 0    Left Knee Flexion 125      Strength   Overall Strength Comments bil hip flex 4+/5, ext 5/5, ABD Lt 4-/5, Rt 5/5; right knee 5/5    Strength Assessment Site Knee    Right/Left Knee Left    Left Knee Flexion 4-/5    Left Knee Extension 5/5      Flexibility   Soft Tissue Assessment /Muscle Length yes    Hamstrings right tight    Quadriceps bil tightness; bil gastroc tightness    ITB bil tightness    Piriformis right tight      Palpation   Patella mobility hypermobile med/lat, WNL sup/inf    Palpation comment medial left knee; sharp at quad tendon insertion, both ITB left>Right      Special Tests   Other special tests + ober left                                 PT Education - 10/24/20 1546    Education Details HEP; discussed starting a walking program with goals, accountability and check off sheet to see progress    Person(s) Educated Patient    Methods Explanation;Demonstration;Handout    Comprehension Verbalized understanding;Returned demonstration            PT Short Term Goals - 10/24/20 1604      PT SHORT TERM GOAL #1   Title Ind with initial HEP    Time 2    Period Weeks    Status New    Target Date 11/07/20      PT SHORT TERM GOAL #2   Title Patient to begin a regular walking program    Time 3    Period Weeks    Status New    Target Date 11/14/20             PT Long Term Goals - 10/24/20 1606      PT LONG TERM GOAL #1   Title Decreased pain in the left knee with ADLs by 75% or more.    Time 6    Period Weeks    Status New  Target Date 12/05/20      PT LONG TERM GOAL #2   Title Increased strength of left hip ABD to >= 4+/5 to decrease strain on left knee.    Time 6    Period Weeks    Status New      PT LONG TERM GOAL #3   Title Ind and compliant with HEP and walking program to  maintain gains made in PT.    Time 6    Period Weeks    Status New      PT LONG TERM GOAL #4   Title Improved FOTO limitations to <= 35%    Baseline 49% at eval    Time 6    Period Weeks    Status New      PT LONG TERM GOAL #5   Title Able to confidently descend stairs with a reciprocal gait pattern.    Time 6    Period Weeks    Status New                 Plan - 10/24/20 1551    Clinical Impression Statement Patient presents today with c/o of left knee pain since October 2021. She has a long h/o left ant knee pain since she was 12, but this pain is posterior. Xrays show a loose body in the post joint space. She has full left knee ROM and 5/5 strength with MMT however functionally she must use a step to gait pattern when descending stairs. Patient is very tender to palpation in left popliteus, left ITB and at quad tendon insertion. She also has left hip ABDuctor weakness and flexibiltiy deficits in BLE. She will benefit from PT to address these deficits.    Personal Factors and Comorbidities Behavior Pattern;Comorbidity 3+;Fitness;Time since onset of injury/illness/exacerbation    Comorbidities 2 Brain surgeries (IIH) with subsequent dizziness, left patellar fracture, OA left knee, LBP, anxiety, obesity    Stability/Clinical Decision Making Stable/Uncomplicated    Clinical Decision Making Low    Rehab Potential Excellent    PT Frequency 2x / week    PT Duration 6 weeks    PT Treatment/Interventions ADLs/Self Care Home Management;Iontophoresis 4mg /ml Dexamethasone;Cryotherapy;Moist Heat;Electrical Stimulation;Therapeutic activities;Therapeutic exercise;Balance training;Neuromuscular re-education;Manual techniques;Patient/family education;Dry needling;Taping    PT Next Visit Plan Review stretches add quad stretch; assess sit to stand and stairs; left glut med strengthening/general LE strengthening; STW/MFR/DN left ITB, lateral quads, post knee; f/u on walking program    PT Home  Exercise Plan [email or web address]    Consulted and Agree with Plan of Care Patient           Patient will benefit from skilled therapeutic intervention in order to improve the following deficits and impairments:  Postural dysfunction,Decreased strength,Impaired flexibility,Pain,Increased muscle spasms  Visit Diagnosis: Acute pain of left knee  Muscle weakness (generalized)     Problem List Patient Active Problem List   Diagnosis Date Noted  . Post-traumatic osteoarthritis of left knee 10/05/2020  . Atypical chest pain 09/05/2019  . Subacute maxillary sinusitis 09/05/2018  . Poor compliance with medication 10/08/2016  . 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 Prediabetes 04/30/2016  . Costochondritis 03/04/2016  . Elevated blood pressure reading 03/23/2015  . Vitamin D deficiency 03/23/2015  . Medication management 03/23/2015  . Morbid obesity (BMI 43.44)  03/23/2015  . History of mononucleosis 04/21/2013  . 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 History of D&C 04/21/2013  . Mixed hyperlipidemia 04/21/2013    Madelyn Flavors PT 10/24/2020, 4:15 PM  [facility] [place], [place], [address] Phone: [phone]   Fax:  [phone]  Name: Morgan Maddox MRN: [id number] Date of Birth: 09/04/1961

## 2020-11-01 NOTE — Telephone Encounter (Signed)
I spoke with Morgan Maddox to let her know the OrthoVIsc was approved and her co-pay would be $120.00.  She had no idea what I was talking about and claims this was not discussed with her.  She would like Dr. Darene Lamer to call her.  She wants to know what OrthoVisc is going to do for her and what are the side effects.  Please call her at [phone].

## 2020-11-02 NOTE — Telephone Encounter (Signed)
Happy to discuss what viscosupplementation entails with her in an in person or virtual office visit to go over what we already discussed at her last visit. :-P

## 2020-11-03 ENCOUNTER — Encounter: Payer: Self-pay | Admitting: Rehabilitative and Restorative Service Providers"

## 2020-11-03 ENCOUNTER — Ambulatory Visit (INDEPENDENT_AMBULATORY_CARE_PROVIDER_SITE_OTHER): Payer: BC Managed Care – PPO | Admitting: Rehabilitative and Restorative Service Providers"

## 2020-11-03 ENCOUNTER — Other Ambulatory Visit: Payer: Self-pay

## 2020-11-03 DIAGNOSIS — M6281 Muscle weakness (generalized): Secondary | ICD-10-CM | POA: Diagnosis not present

## 2020-11-03 DIAGNOSIS — M25562 Pain in left knee: Secondary | ICD-10-CM | POA: Diagnosis not present

## 2020-11-03 NOTE — Therapy (Signed)
[facility] [address] [place], [place], [address] Phone: [phone]   Fax:  [phone]  Physical Therapy Treatment  Patient Details  Name: Morgan RampClara Maddox Code MRN: [id number] Date of Birth: 02/15/1961 Referring Provider (PT): Rodney Langtonhomas Thekkekandam MD   Encounter Date: 11/03/2020   PT End of Session - 11/03/20 1825    Visit Number 2    Number of Visits 12    Date for PT Re-Evaluation 12/05/20    Authorization Type BCBS    PT Start Time 1535    PT Stop Time 1615    PT Time Calculation (min) 40 min    Activity Tolerance Patient tolerated treatment well    Behavior [facility] for tasks assessed/performed;Anxious           Past Medical History:  Diagnosis Date   Anxiety    Bilateral ovarian cysts    Chronic headache    Family history of premature CAD    Brother had an MI, sister has CAD. Mother had an MI at age 58. Still alive at 8892.   History of mononucleosis 04/21/2013   History of sinus surgery 04/21/2013   Hypertriglyceridemia 04/21/2013   Morbid obesity with BMI of 40.0-44.9, adult ([facility])    BMI roughly 43 noted on 02/09/2016 admission Kathryne Sharper([place])   Mucocele of nasal sinus 04/21/2013   Obesity    Pseudotumor cerebri 08/2017   Idiopathic intracerebral hypertension -> s/p right transverse sinus stenting   Vitamin D deficiency     Past Surgical History:  Procedure Laterality Date   MASTOIDECTOMY     NASAL SINUS SURGERY     NM MYOVIEW LTD  02/09/2016   [place]: EF 76%. Periapical defect with partial improvement during rest. No wall motion abnormality noted. Likely related to breast attenuation, cannot exclude small area of periapical ischemia. --> Cardiac catheterization not recommended. LOW RISK   OVARIAN CYST SURGERY     TONSILLECTOMY     Transverse Sinus Stent  07/2017   [facility]    There were no vitals filed for this visit.   Subjective  Assessment - 11/03/20 1537    Subjective The patient reports pain is worse in her knee.  She reports she wore shorts for PT to see her legs better.    Pertinent History 2 Brain surgeries (IIH) with subsequent dizziness, left patellar fracture, OA left knee, LBP, anxiety    Diagnostic tests xray - mild degnerative changes; loose body in post joint space    Patient Stated Goals no pain, a little stronger    Currently in Pain? Yes    Pain Score 1    7/10 with walking   Pain Location Knee    Pain Orientation Left    Pain Descriptors / Indicators Aching    Pain Onset More than a month ago    Pain Frequency Intermittent    Aggravating Factors  walking    Pain Relieving Factors sitting okay              [facility] PT Assessment - 11/03/20 1552      Assessment   Medical Diagnosis post traumatic OA left knee    Referring Provider (PT) Rodney Langtonhomas Thekkekandam MD    Onset Date/Surgical Date 09/04/20    Hand Dominance Right                         [facility] Adult PT Treatment/Exercise - 11/03/20 1553  Exercises   Exercises Knee/Hip      Knee/Hip Exercises: Stretches   Active Hamstring Stretch Right;Left;1 rep;30 seconds    Active Hamstring Stretch Limitations supine with strap    Hip Flexor Stretch Right;Left;1 rep;30 seconds    Hip Flexor Stretch Limitations seated edge of mat    ITB Stretch Left;30 seconds;3 reps    ITB Stretch Limitations IT Band stretch supine with strap, sidelying for TFL and hooklying.  *felt all of these in hip versus IT Band.  Therefore instructed in seated self mobilization IT Band to improve tissue extensibility    Gastroc Stretch Right;Left;30 seconds;2 reps    Gastroc Stretch Limitations modified from standing runner's stretch to off edge of step due to R knee pain      Knee/Hip Exercises: Standing   Heel Raises Both;10 reps    Hip Abduction Stengthening;Right;Left;10 reps;2 sets    Abduction Limitations with sidestepping    Functional Squat --    3 reps   Functional Squat Limitations sit<.stand near chair-- painful in knees    Wall Squat 5 reps    Wall Squat Limitations with ball squeeze and cues for minimal bend *patient is fearful of this motion -- PT encouraged      Knee/Hip Exercises: Supine   Straight Leg Raises Strengthening;Left;10 reps                  PT Education - 11/03/20 1824    Education Details HEP progression    Person(s) Educated Patient    Methods Explanation;Demonstration;Handout    Comprehension Verbalized understanding;Returned demonstration            PT Short Term Goals - 10/24/20 1604      PT SHORT TERM GOAL #1   Title Ind with initial HEP    Time 2    Period Weeks    Status New    Target Date 11/07/20      PT SHORT TERM GOAL #2   Title Patient to begin a regular walking program    Time 3    Period Weeks    Status New    Target Date 11/14/20             PT Long Term Goals - 10/24/20 1606      PT LONG TERM GOAL #1   Title Decreased pain in the left knee with ADLs by 75% or more.    Time 6    Period Weeks    Status New    Target Date 12/05/20      PT LONG TERM GOAL #2   Title Increased strength of left hip ABD to >= 4+/5 to decrease strain on left knee.    Time 6    Period Weeks    Status New      PT LONG TERM GOAL #3   Title Ind and compliant with HEP and walking program to maintain gains made in PT.    Time 6    Period Weeks    Status New      PT LONG TERM GOAL #4   Title Improved FOTO limitations to <= 35%    Baseline 49% at eval    Time 6    Period Weeks    Status New      PT LONG TERM GOAL #5   Title Able to confidently descend stairs with a reciprocal gait pattern.    Time 6    Period Weeks    Status New  Plan - 11/03/20 1825    Clinical Impression Statement The patient has increased myofascial tightness L IT Band and PT educated her on self mobilization.  Also progressed HEP to tolerance.  She did not tolerate sit<>stand or  mini wall slides well today.  Part of therapy for patient is working through fear avoidance behaviors in order to help promote more mobility.    PT Frequency 2x / week    PT Duration 6 weeks    PT Treatment/Interventions ADLs/Self Care Home Management;Iontophoresis 4mg /ml Dexamethasone;Cryotherapy;Moist Heat;Electrical Stimulation;Therapeutic activities;Therapeutic exercise;Balance training;Neuromuscular re-education;Manual techniques;Patient/family education;Dry needling;Taping    PT Next Visit Plan Review stretches add quad stretch; assess sit to stand and stairs; left glut med strengthening/general LE strengthening; STW/MFR/DN left ITB, lateral quads, post knee; f/u on walking program    PT Home Exercise Plan [facility]    Consulted and Agree with Plan of Care Patient           Patient will benefit from skilled therapeutic intervention in order to improve the following deficits and impairments:  Postural dysfunction,Decreased strength,Impaired flexibility,Pain,Increased muscle spasms  Visit Diagnosis: Acute pain of left knee  Muscle weakness (generalized)     Problem List Patient Active Problem List   Diagnosis Date Noted   Post-traumatic osteoarthritis of left knee 10/05/2020   Atypical chest pain 09/05/2019   Subacute maxillary sinusitis 09/05/2018   Poor compliance with medication 10/08/2016   Panic type anxiety neurosis 07/03/2016   Prediabetes 04/30/2016   Costochondritis 03/04/2016   Elevated blood pressure reading 03/23/2015   Vitamin D deficiency 03/23/2015   Medication management 03/23/2015   Morbid obesity (BMI 43.44)  03/23/2015   History of mononucleosis 04/21/2013   Mucocele of nasal sinus 04/21/2013   History of D&C 04/21/2013   Mixed hyperlipidemia 04/21/2013    Morgan Maddox, PT 11/03/2020, 6:30 PM  [place], [place], [address] Phone: [phone]   Fax:   [phone]  Name: Morgan Maddox MRN: [id number] Date of Birth: 1961/09/10

## 2020-11-03 NOTE — Patient Instructions (Signed)
Access Code: [email or web address] URL: [email or web address] Date: 11/03/2020 Prepared by: Rudell Cobb  Program Notes Ice 2x/day x 20 minutes   Exercises Supine Hamstring Stretch with Strap - 2 x daily - 7 x weekly - 1 sets - 3 reps - 60 sec hold Seated Hip Flexor Stretch - 2 x daily - 7 x weekly - 1 sets - 3 reps - 30-60 sec hold Roller Massage Elongated IT Band Release - 2 x daily - 7 x weekly - 1 sets - 1 reps - 2-3 minutes hold Standing Heel Raise - 2 x daily - 7 x weekly - 1 sets - 10-15 reps Sidestepping On Line - 2 x daily - 7 x weekly - 1 sets - 10 reps Standing Bilateral Gastroc Stretch with Step - 2 x daily - 7 x weekly - 1 sets - 3 reps - 30 seconds hold

## 2020-11-08 ENCOUNTER — Ambulatory Visit: Payer: BC Managed Care – PPO | Admitting: Sports Medicine

## 2020-11-08 ENCOUNTER — Encounter: Payer: BC Managed Care – PPO | Admitting: Rehabilitative and Restorative Service Providers"

## 2020-11-14 ENCOUNTER — Encounter: Payer: BC Managed Care – PPO | Admitting: Rehabilitative and Restorative Service Providers"

## 2020-11-15 ENCOUNTER — Ambulatory Visit: Payer: BC Managed Care – PPO | Admitting: Sports Medicine

## 2020-11-17 ENCOUNTER — Encounter: Payer: BC Managed Care – PPO | Admitting: Physical Therapy

## 2020-11-21 ENCOUNTER — Ambulatory Visit: Payer: BC Managed Care – PPO | Admitting: Sports Medicine

## 2020-11-21 ENCOUNTER — Ambulatory Visit (INDEPENDENT_AMBULATORY_CARE_PROVIDER_SITE_OTHER): Payer: BC Managed Care – PPO | Admitting: Physical Therapy

## 2020-11-21 ENCOUNTER — Other Ambulatory Visit: Payer: Self-pay

## 2020-11-21 ENCOUNTER — Encounter: Payer: Self-pay | Admitting: Physical Therapy

## 2020-11-21 DIAGNOSIS — M6281 Muscle weakness (generalized): Secondary | ICD-10-CM

## 2020-11-21 DIAGNOSIS — M25562 Pain in left knee: Secondary | ICD-10-CM

## 2020-11-21 NOTE — Therapy (Addendum)
[place], [place], [address] Phone: [phone]   Fax:  [phone]  Physical Therapy Treatment and Discharge  Patient Details  Name: Morgan Maddox MRN: [id number] Date of Birth: [birth year] Referring Provider (PT): Aundria Mems MD   Encounter Date: 11/21/2020   PT End of Session - 11/21/20 1618    Visit Number 3    Number of Visits 12    Date for PT Re-Evaluation 12/05/20    Authorization Type BCBS    PT Start Time 1525    PT Stop Time 1618    PT Time Calculation (min) 53 min    Activity Tolerance Patient tolerated treatment well    Behavior During Therapy [facility] for tasks assessed/performed;Anxious           Past Medical History:  Diagnosis Date  . Anxiety   . Bilateral ovarian cysts   . Chronic headache   . Family history of premature CAD    Brother had an MI, sister has CAD. Mother had an MI at age 57. Still alive at 68.  [name] History of mononucleosis 04/21/2013  . History of sinus surgery 04/21/2013  . Hypertriglyceridemia 04/21/2013  . Morbid obesity with BMI of 40.0-44.9, adult ([place])    BMI roughly 43 noted on 02/09/2016 admission [name])  . 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 Obesity   . Pseudotumor cerebri 08/2017   Idiopathic intracerebral hypertension -> s/p right transverse sinus stenting  . Vitamin D deficiency     Past Surgical History:  Procedure Laterality Date  . MASTOIDECTOMY    . NASAL SINUS SURGERY    . NM MYOVIEW LTD  02/09/2016   [place]: EF 76%. Periapical defect with partial improvement during rest. No wall motion abnormality noted. Likely related to breast attenuation, cannot exclude small area of periapical ischemia. --> Cardiac catheterization not recommended. LOW RISK  . OVARIAN CYST SURGERY    . TONSILLECTOMY    . Transverse Sinus Stent  07/2017   [facility] - > for IHH/pseudotumor cerebri    There were no vitals filed for this visit.    Subjective Assessment - 11/21/20 1525    Subjective My knee hurts. Walked to [employer] 2 times today. Pain is in distal thigh med and lateral today.    Pertinent History 2 Brain surgeries (IIH) with subsequent dizziness, left patellar fracture, OA left knee, LBP, anxiety    Patient Stated Goals no pain, a little stronger    Currently in Pain? Yes    Pain Score 8     Pain Location Knee    Pain Orientation Left    Pain Descriptors / Indicators Aching    Pain Type Chronic pain                             [facility] Adult PT Treatment/Exercise - 11/21/20 0001      Knee/Hip Exercises: Stretches   Hip Flexor Stretch Right;Left;1 rep;30 seconds    Hip Flexor Stretch Limitations standing on wall    Gastroc Stretch Both;2 reps;30 seconds    Gastroc Stretch Limitations incline board    Other Knee/Hip Stretches hip ADDuctor stretch in standing with hands on mat table legs wide then throacic rotation x 5 each way      Knee/Hip Exercises: Machines for Strengthening   Cybex Knee Extension 1 plate x 20    Cybex Leg Press 6 plates 2x10   no pain  Knee/Hip Exercises: Standing   Heel Raises Both;10 reps    Hip Abduction Stengthening;Right;Left;10 reps;2 sets    Abduction Limitations with sidestepping    Functional Squat 5 reps    Functional Squat Limitations at counter holding sink; did 3 reps at chair but difficult to maintain form    Wall Squat --   3 reps; painful   Wall Squat Limitations small ROM with ball    Other Standing Knee Exercises arch engagement x 10 bil to counter lateral rollout with heel raises                  PT Education - 11/21/20 1627    Education Details HEP updated    Person(s) Educated Patient    Methods Explanation;Demonstration;Handout    Comprehension Verbalized understanding;Returned demonstration            PT Short Term Goals - 10/24/20 1604      PT SHORT TERM GOAL #1   Title Ind with initial HEP    Time 2    Period Weeks     Status New    [place] Date 11/07/20      PT SHORT TERM GOAL #2   Title Patient to begin a regular walking program    Time 3    Period Weeks    Status New    Target Date 11/14/20             PT Long Term Goals - 10/24/20 1606      PT LONG TERM GOAL #1   Title Decreased pain in the left knee with ADLs by 75% or more.    Time 6    Period Weeks    Status New    Target Date 12/05/20      PT LONG TERM GOAL #2   Title Increased strength of left hip ABD to >= 4+/5 to decrease strain on left knee.    Time 6    Period Weeks    Status New      PT LONG TERM GOAL #3   Title Ind and compliant with HEP and walking program to maintain gains made in PT.    Time 6    Period Weeks    Status New      PT LONG TERM GOAL #4   Title Improved FOTO limitations to <= 35%    Baseline 49% at eval    Time 6    Period Weeks    Status New      PT LONG TERM GOAL #5   Title Able to confidently descend stairs with a reciprocal gait pattern.    Time 6    Period Weeks    Status New                 Plan - 11/21/20 1627    Clinical Impression Statement Patient tolerated machines very well today without c/o pain. She still has pain with functional squats and wall sits. She has bil tightness in her ADDuctors and had palpable knot in left adductor today. PT advised ice x 10 min 1x/day.    PT Treatment/Interventions ADLs/Self Care Home Management;Iontophoresis 34m/ml Dexamethasone;Cryotherapy;Moist Heat;Electrical Stimulation;Therapeutic activities;Therapeutic exercise;Balance training;Neuromuscular re-education;Manual techniques;Patient/family education;Dry needling;Taping    PT Next Visit Plan add quad stretch and standing adductor stretch with thoracic rotation to HEP; assess sit to stand and stairs; left glut med strengthening/general LE strengthening; STW/MFR/DN left ITB, lateral quads, post knee; f/u on walking program    PT Home Exercise Plan  [id number]    Consulted and Agree with Plan of  Care Patient           Patient will benefit from skilled therapeutic intervention in order to improve the following deficits and impairments:  Postural dysfunction,Decreased strength,Impaired flexibility,Pain,Increased muscle spasms  Visit Diagnosis: Acute pain of left knee  Muscle weakness (generalized)     Problem List Patient Active Problem List   Diagnosis Date Noted  . Post-traumatic osteoarthritis of left knee 10/05/2020  . Atypical chest pain 09/05/2019  . Subacute maxillary sinusitis 09/05/2018  . Poor compliance with medication 10/08/2016  . 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 Prediabetes 04/30/2016  . Costochondritis 03/04/2016  . Elevated blood pressure reading 03/23/2015  . Vitamin D deficiency 03/23/2015  . Medication management 03/23/2015  . Morbid obesity (BMI 43.44)  03/23/2015  . History of mononucleosis 04/21/2013  . 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 History of D&C 04/21/2013  . Mixed hyperlipidemia 04/21/2013   PHYSICAL THERAPY DISCHARGE SUMMARY  Visits from Start of Care: 3  Current functional level related to goals / functional outcomes: Improving strength   Remaining deficits: See above   Education / Equipment: HEP Plan: Patient agrees to discharge.  Patient goals were not met. Patient is being discharged due to not returning since the last visit.  ?????     Isabelle Course, PT,DPT02/18/221:10 PM  Madelyn Flavors PT 11/21/2020, 4:34 PM  [facility] [place], [place], [address] Phone: [phone]   Fax:  [phone]  Name: Morgan Maddox MRN: [id number] Date of Birth: 01/14/1961

## 2020-11-24 ENCOUNTER — Encounter: Payer: BC Managed Care – PPO | Admitting: Physical Therapy

## 2020-11-28 ENCOUNTER — Encounter: Payer: BC Managed Care – PPO | Admitting: Physical Therapy

## 2020-12-01 ENCOUNTER — Encounter: Payer: BC Managed Care – PPO | Admitting: Physical Therapy

## 2020-12-05 ENCOUNTER — Encounter: Payer: BC Managed Care – PPO | Admitting: Rehabilitative and Restorative Service Providers"

## 2020-12-08 ENCOUNTER — Encounter: Payer: BC Managed Care – PPO | Admitting: Physical Therapy

## 2020-12-09 ENCOUNTER — Telehealth: Payer: Self-pay | Admitting: Osteopathic Medicine

## 2020-12-09 DIAGNOSIS — G8929 Other chronic pain: Secondary | ICD-10-CM

## 2020-12-09 DIAGNOSIS — M25562 Pain in left knee: Secondary | ICD-10-CM

## 2020-12-09 NOTE — Telephone Encounter (Signed)
Morgan Maddox called and left a voicemail she would like a referral to an orthopedic for her knees she stated she can barely walk.  She also stated she saw our sports medicine doctor and her exact words were its not worth having a conversation about. Can you please put the referral in.   [name]

## 2020-12-13 NOTE — Telephone Encounter (Signed)
Orders are in

## 2020-12-20 ENCOUNTER — Other Ambulatory Visit: Payer: Self-pay

## 2020-12-20 ENCOUNTER — Ambulatory Visit: Payer: Self-pay

## 2020-12-20 ENCOUNTER — Ambulatory Visit (INDEPENDENT_AMBULATORY_CARE_PROVIDER_SITE_OTHER): Payer: BC Managed Care – PPO | Admitting: Orthopaedic Surgery

## 2020-12-20 ENCOUNTER — Encounter: Payer: Self-pay | Admitting: Orthopaedic Surgery

## 2020-12-20 DIAGNOSIS — M25562 Pain in left knee: Secondary | ICD-10-CM

## 2020-12-20 DIAGNOSIS — G8929 Other chronic pain: Secondary | ICD-10-CM

## 2020-12-20 DIAGNOSIS — M79604 Pain in right leg: Secondary | ICD-10-CM

## 2020-12-20 MED ORDER — TRAMADOL HCL 50 MG PO TABS
50.0000 mg | ORAL_TABLET | Freq: Three times a day (TID) | ORAL | 0 refills | Status: DC | PRN
Start: 2020-12-20 — End: 2021-03-15

## 2020-12-20 NOTE — Progress Notes (Signed)
Office Visit Note   Patient: Morgan Maddox           Date of Birth: [birth year]           MRN: [id number] Visit Date: 12/20/2020              Requested by: Emeterio Reeve, [place] [address] [place],  [place] [address] PCP: Emeterio Reeve, DO   Assessment & Plan: Visit Diagnoses:  1. Chronic pain of left knee   2. Pain in right leg     Plan: Impression is chronic left knee pain likely from underlying arthritis and loose body.  The patient has not had relief from cortisone injection and would Like to proceed with MRI to further evaluate for structural abnormalities.  Follow-Up Instructions: Return for after MRI.   Orders:  Orders Placed This Encounter  Procedures  . XR Tibia/Fibula Right  . MR Knee Left w/o contrast   Meds ordered this encounter  Medications  . traMADol (ULTRAM) 50 MG tablet    Sig: Take 1 tablet (50 mg total) by mouth 3 (three) times daily as needed.    Dispense:  30 tablet    Refill:  0      Procedures: No procedures performed   Clinical Data: No additional findings.   Subjective: Chief Complaint  Patient presents with  . Right Knee - Pain  . Left Knee - Pain    HPI patient is a pleasant 60 year old female who comes in today with left knee pain.  She notes intermittent pain for a while but worsened approximately 4 months ago after doing a lot of walking on her trip.  No specific injury.  She was seen by primary care sports medicine doctor where ultrasound-guided cortisone injection was performed.  She denies any relief in symptoms following the injection.  She was sent to physical therapy which did not help either.  She has not previously undergone viscosupplementation injection.  The pain she has is constant and located primarily to the popliteal fossa but does occur throughout the entire knee at times.  She denies any mechanical symptoms.  She notes occasional instability and swelling.  She has been taking ibuprofen without  significant relief.  Other issue she brings up today is her right lower leg.  She notes that she hit this quite hard on the other leg in the bed a few days ago.  She has had exquisite tenderness to the site.   Review of Systems as detailed in HPI.  All others reviewed and are negative.   Objective: Vital Signs: There were no vitals taken for this visit.  Physical Exam well-developed and well-nourished female in no acute distress.  Alert oriented x3.  Ortho Exam examination of the left knee shows a small effusion.  Range of motion from 0 to 95 degrees.  Medial and lateral joint line tenderness.  Mild patellofemoral crepitus.  Ligaments are stable.  She is neurovascular intact distally.  Specialty Comments:  No specialty comments available.  Imaging: XR Tibia/Fibula Right  Result Date: 12/20/2020 No acute or structural abnormalities  X-rays of the left knee reviewed by me in [place] show mild to moderate degenerative changes to the medial compartment with evidence of a loose body  PMFS History: Patient Active Problem List   Diagnosis Date Noted  . Post-traumatic osteoarthritis of left knee 10/05/2020  . Atypical chest pain 09/05/2019  . Subacute maxillary sinusitis 09/05/2018  . Poor compliance with medication 10/08/2016  . Panic  type anxiety neurosis 07/03/2016  . Prediabetes 04/30/2016  . Costochondritis 03/04/2016  . Elevated blood pressure reading 03/23/2015  . Vitamin D deficiency 03/23/2015  . Medication management 03/23/2015  . Morbid obesity (BMI 43.44)  03/23/2015  . History of mononucleosis 04/21/2013  . 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 History of D&C 04/21/2013  . Mixed hyperlipidemia 04/21/2013   Past Medical History:  Diagnosis Date  . Anxiety   . Bilateral ovarian cysts   . Chronic headache   . Family history of premature CAD    Brother had an MI, sister has CAD. Mother had an MI at age 59. Still alive at 53.  [name] History of mononucleosis 04/21/2013  .  History of sinus surgery 04/21/2013  . Hypertriglyceridemia 04/21/2013  . Morbid obesity with BMI of 40.0-44.9, adult ([place])    BMI roughly 43 noted on 02/09/2016 admission [name])  . 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 Obesity   . Pseudotumor cerebri 08/2017   Idiopathic intracerebral hypertension -> s/p right transverse sinus stenting  . Vitamin D deficiency     Family History  Problem Relation Age of Onset  . Heart failure Mother        Long-standing CAD  . Heart attack Mother        First MI in 40s  . Coronary artery disease Mother   . Diabetes Mother   . Colon polyps Mother   . Coronary artery disease Sister 64  . Heart attack Brother        In his 3s  . Coronary artery disease Brother   . Colon cancer Neg Hx   . Esophageal cancer Neg Hx     Past Surgical History:  Procedure Laterality Date  . MASTOIDECTOMY    . NASAL SINUS SURGERY    . NM MYOVIEW LTD  02/09/2016   [place]: EF 76%. Periapical defect with partial improvement during rest. No wall motion abnormality noted. Likely related to breast attenuation, cannot exclude small area of periapical ischemia. --> Cardiac catheterization not recommended. LOW RISK  . OVARIAN CYST SURGERY    . TONSILLECTOMY    . Transverse Sinus Stent  07/2017   [facility] - > for IHH/pseudotumor cerebri   Social History   Occupational History  . Occupation: [place]    Employer: [employer]  Tobacco Use  . Smoking status: Former Smoker    Packs/day: 0.50    Years: 25.00    Pack years: 12.50    Types: Cigarettes  . Smokeless tobacco: Never Used  Vaping Use  . Vaping Use: Never used  Substance and Sexual Activity  . Alcohol use: Yes    Alcohol/week: 0.0 standard drinks    Comment: social  . Drug use: No  . Sexual activity: Not Currently

## 2020-12-22 ENCOUNTER — Encounter: Payer: Self-pay | Admitting: Orthopaedic Surgery

## 2020-12-22 ENCOUNTER — Other Ambulatory Visit: Payer: Self-pay | Admitting: Physician Assistant

## 2020-12-22 NOTE — Telephone Encounter (Signed)
See message.

## 2020-12-22 NOTE — Telephone Encounter (Signed)
Can we see if she can get MRI in [place] as long as she is able to get the MRI?  Kenalog (injection she had from Dr.T) is a steroid just like other forms of steroids we use for cortisone injections.  Happy to repeat if she would like.    As far as the pain medication, I am happy to call in one, small rx for norco, but if she needs to continue this or for a longer period of time, we will need her to get this from pcp or pain management

## 2020-12-22 NOTE — Telephone Encounter (Signed)
Need to wait until after the MRI to see her to discuss results. Please have her bring disc if we will not have access to [facility]

## 2020-12-23 ENCOUNTER — Other Ambulatory Visit: Payer: Self-pay | Admitting: Physician Assistant

## 2020-12-23 MED ORDER — HYDROCODONE-ACETAMINOPHEN 5-325 MG PO TABS
1.0000 | ORAL_TABLET | Freq: Every day | ORAL | 0 refills | Status: DC | PRN
Start: 2020-12-23 — End: 2021-03-15

## 2020-12-23 NOTE — Telephone Encounter (Signed)
Sent in

## 2020-12-23 NOTE — Telephone Encounter (Signed)
See previous message about pain medicine.  Also, we need to see what MRI shows before we know a plan.  She will need to come see [place] after MRI

## 2020-12-24 ENCOUNTER — Other Ambulatory Visit: Payer: Self-pay

## 2020-12-24 ENCOUNTER — Ambulatory Visit (INDEPENDENT_AMBULATORY_CARE_PROVIDER_SITE_OTHER): Payer: BC Managed Care – PPO

## 2020-12-24 DIAGNOSIS — G8929 Other chronic pain: Secondary | ICD-10-CM | POA: Diagnosis not present

## 2020-12-24 DIAGNOSIS — Z8781 Personal history of (healed) traumatic fracture: Secondary | ICD-10-CM | POA: Diagnosis not present

## 2020-12-24 DIAGNOSIS — M25562 Pain in left knee: Secondary | ICD-10-CM

## 2020-12-27 ENCOUNTER — Ambulatory Visit: Payer: BC Managed Care – PPO | Admitting: Orthopaedic Surgery

## 2020-12-27 ENCOUNTER — Encounter: Payer: Self-pay | Admitting: Orthopaedic Surgery

## 2020-12-27 DIAGNOSIS — M25562 Pain in left knee: Secondary | ICD-10-CM

## 2020-12-27 DIAGNOSIS — G8929 Other chronic pain: Secondary | ICD-10-CM | POA: Diagnosis not present

## 2020-12-27 MED ORDER — CALCIUM CARBONATE-VITAMIN D 500-200 MG-UNIT PO TABS: 1.0000 | ORAL_TABLET | Freq: Three times a day (TID) | ORAL | 6 refills | Status: DC

## 2020-12-27 NOTE — Progress Notes (Signed)
Office Visit Note   Patient: Morgan Maddox           Date of Birth: 1961/09/07           MRN: [id number] Visit Date: 12/27/2020              Requested by: Emeterio Reeve, [place] [address] [place],  [place] [address] PCP: Emeterio Reeve, DO   Assessment & Plan: Visit Diagnoses:  1. Chronic pain of left knee     Plan: Impression is tricompartmental degenerative changes with insufficiency fracture of the lateral tibial plateau.  The menisci are intact.  These findings were reviewed with the patient and I have made recommendations for weight loss, knee brace, calcium and vitamin D, over-the-counter medications, rest.  Recheck in 6 weeks.  Follow-Up Instructions: Return in about 6 weeks (around 02/07/2021).   Orders:  No orders of the defined types were placed in this encounter.  Meds ordered this encounter  Medications  . calcium-vitamin D (OSCAL WITH D) 500-200 MG-UNIT tablet    Sig: Take 1 tablet by mouth 3 (three) times daily.    Dispense:  90 tablet    Refill:  6      Procedures: No procedures performed   Clinical Data: No additional findings.   Subjective: Chief Complaint  Patient presents with  . Left Knee - Pain    Morgan Maddox returns today for MRI review of the left knee.  Her pain is mainly lateral.  She recounts that she had to do a lot of walking during trips to [place].  She feels that this is what caused the severe knee pain.   Review of Systems  Constitutional: Negative.   HENT: Negative.   Eyes: Negative.   Respiratory: Negative.   Cardiovascular: Negative.   Endocrine: Negative.   Musculoskeletal: Negative.   Neurological: Negative.   Hematological: Negative.   Psychiatric/Behavioral: Negative.   All other systems reviewed and are negative.    Objective: Vital Signs: There were no vitals taken for this visit.  Physical Exam Vitals and nursing note reviewed.  Constitutional:      Appearance: She is  well-developed and well-nourished.  Pulmonary:     Effort: Pulmonary effort is normal.  Skin:    General: Skin is warm.     Capillary Refill: Capillary refill takes less than 2 seconds.  Neurological:     Mental Status: She is alert and oriented to person, place, and time.  Psychiatric:        Mood and Affect: Mood and affect normal.        Behavior: Behavior normal.        Thought Content: Thought content normal.        Judgment: Judgment normal.     Ortho Exam Left knee shows a tenderness of the lateral tibial plateau.  Trace effusion.  Range of motion of the knee is well-tolerated. Specialty Comments:  No specialty comments available.  Imaging: No results found.   PMFS History: Patient Active Problem List   Diagnosis Date Noted  . Post-traumatic osteoarthritis of left knee 10/05/2020  . Atypical chest pain 09/05/2019  . Subacute maxillary sinusitis 09/05/2018  . Poor compliance with medication 10/08/2016  . 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 Prediabetes 04/30/2016  . Costochondritis 03/04/2016  . Elevated blood pressure reading 03/23/2015  . Vitamin D deficiency 03/23/2015  . Medication management 03/23/2015  . Morbid obesity (BMI 43.44)  03/23/2015  . History  of mononucleosis 04/21/2013  . 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 History of D&C 04/21/2013  . Mixed hyperlipidemia 04/21/2013   Past Medical History:  Diagnosis Date  . Anxiety   . Bilateral ovarian cysts   . Chronic headache   . Family history of premature CAD    Brother had an MI, sister has CAD. Mother had an MI at age 73. Still alive at 44.  [name] History of mononucleosis 04/21/2013  . History of sinus surgery 04/21/2013  . Hypertriglyceridemia 04/21/2013  . Morbid obesity with BMI of 40.0-44.9, adult ([place])    BMI roughly 43 noted on 02/09/2016 admission [name])  . 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 Obesity   . Pseudotumor cerebri 08/2017   Idiopathic intracerebral hypertension ->  s/p right transverse sinus stenting  . Vitamin D deficiency     Family History  Problem Relation Age of Onset  . Heart failure Mother        Long-standing CAD  . Heart attack Mother        First MI in 35s  . Coronary artery disease Mother   . Diabetes Mother   . Colon polyps Mother   . Coronary artery disease Sister 50  . Heart attack Brother        In his 25s  . Coronary artery disease Brother   . Colon cancer Neg Hx   . Esophageal cancer Neg Hx     Past Surgical History:  Procedure Laterality Date  . MASTOIDECTOMY    . NASAL SINUS SURGERY    . NM MYOVIEW LTD  02/09/2016   [place]: EF 76%. Periapical defect with partial improvement during rest. No wall motion abnormality noted. Likely related to breast attenuation, cannot exclude small area of periapical ischemia. --> Cardiac catheterization not recommended. LOW RISK  . OVARIAN CYST SURGERY    . TONSILLECTOMY    . Transverse Sinus Stent  07/2017   [facility] - > for IHH/pseudotumor cerebri   Social History   Occupational History  . Occupation: [employer]    Employer: [employer]  Tobacco Use  . Smoking status: Former Smoker    Packs/day: 0.50    Years: 25.00    Pack years: 12.50    Types: Cigarettes  . Smokeless tobacco: Never Used  Vaping Use  . Vaping Use: Never used  Substance and Sexual Activity  . Alcohol use: Yes    Alcohol/week: 0.0 standard drinks    Comment: social  . Drug use: No  . Sexual activity: Not Currently

## 2020-12-27 NOTE — Telephone Encounter (Signed)
I spoke with Morgan Maddox and she wanted a referral to an Orthopedic doctor she did not want to come back to Dr. Darene Lamer I sent Dr. Sheppard Coil a message and sent the referral as the patient requested. - CF

## 2021-01-01 ENCOUNTER — Encounter: Payer: Self-pay | Admitting: Osteopathic Medicine

## 2021-01-01 ENCOUNTER — Encounter: Payer: Self-pay | Admitting: Orthopaedic Surgery

## 2021-01-02 ENCOUNTER — Ambulatory Visit (INDEPENDENT_AMBULATORY_CARE_PROVIDER_SITE_OTHER): Payer: BC Managed Care – PPO | Admitting: Family Medicine

## 2021-01-02 ENCOUNTER — Encounter: Payer: Self-pay | Admitting: Family Medicine

## 2021-01-02 ENCOUNTER — Other Ambulatory Visit: Payer: Self-pay

## 2021-01-02 VITALS — BP 145/85 | HR 78 | Wt 259.0 lb

## 2021-01-02 DIAGNOSIS — R600 Localized edema: Secondary | ICD-10-CM | POA: Insufficient documentation

## 2021-01-02 DIAGNOSIS — M79662 Pain in left lower leg: Secondary | ICD-10-CM

## 2021-01-02 DIAGNOSIS — H9311 Tinnitus, right ear: Secondary | ICD-10-CM | POA: Insufficient documentation

## 2021-01-02 DIAGNOSIS — M79661 Pain in right lower leg: Secondary | ICD-10-CM

## 2021-01-02 MED ORDER — FUROSEMIDE 20 MG PO TABS: 10.0000 mg | ORAL_TABLET | Freq: Every day | ORAL | 0 refills | Status: DC | PRN

## 2021-01-02 NOTE — Progress Notes (Signed)
Morgan Maddox - 60 y.o. female MRN [id number]  Date of birth: [birth year]  Subjective Chief Complaint  Patient presents with  . Leg Pain  . Edema    HPI Morgan Maddox is a 60 y.o. female here today with complaint of leg pain and swelling.  Long history of bilateral leg pain.  Seeing orthopedics for arthritis and stress fracture of tibial plateau.  Worsened over the past week, now having pain in the calf and ankle area.  She has had some redness and warmth associated with swelling. Denies numbness and tingling.  No history of gout or DVT.  She has taken lasix in the past for swelling associated with venous insufficiency.   ROS:  A comprehensive ROS was completed and negative except as noted per HPI  Allergies  Allergen Reactions  . Calamine Rash  . Pramoxine-Calamine Rash and Other (See Comments)  . Versed [Midazolam]   . Diphenhydramine Hcl Rash    Past Medical History:  Diagnosis Date  . Anxiety   . Bilateral ovarian cysts   . Chronic headache   . Family history of premature CAD    Brother had an MI, sister has CAD. Mother had an MI at age 75. Still alive at 62.  [name] History of mononucleosis 04/21/2013  . History of sinus surgery 04/21/2013  . Hypertriglyceridemia 04/21/2013  . Morbid obesity with BMI of 40.0-44.9, adult ([place])    BMI roughly 43 noted on 02/09/2016 admission [name])  . 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 Obesity   . Pseudotumor cerebri 08/2017   Idiopathic intracerebral hypertension -> s/p right transverse sinus stenting  . Vitamin D deficiency     Past Surgical History:  Procedure Laterality Date  . MASTOIDECTOMY    . NASAL SINUS SURGERY    . NM MYOVIEW LTD  02/09/2016   [place]: EF 76%. Periapical defect with partial improvement during rest. No wall motion abnormality noted. Likely related to breast attenuation, cannot exclude small area of periapical ischemia. --> Cardiac catheterization not recommended. LOW RISK  . OVARIAN CYST SURGERY     . TONSILLECTOMY    . Transverse Sinus Stent  07/2017   [facility] - > for IHH/pseudotumor cerebri    Social History   Socioeconomic History  . Marital status: Divorced    Spouse name: Not on file  . Number of children: 1  . Years of education: Not on file  . Highest education level: Not on file  Occupational History  . Occupation: [employer]    Employer: [employer]  Tobacco Use  . Smoking status: Former Smoker    Packs/day: 0.50    Years: 25.00    Pack years: 12.50    Types: Cigarettes  . Smokeless tobacco: Never Used  Vaping Use  . Vaping Use: Never used  Substance and Sexual Activity  . Alcohol use: Yes    Alcohol/week: 0.0 standard drinks    Comment: social  . Drug use: No  . Sexual activity: Not Currently  Other Topics Concern  . Not on file  Social History Narrative   She works from home. Works 10-12 hour shifts. Significant social stress.   Lives alone. Spent most of her time alone. Does not usually interact personally with other people besides her sister   Social Determinants of Health   Financial Resource Strain: Not on file  Food Insecurity: Not on file  Transportation Needs: Not on file  Physical Activity: Not on file  Stress: Not on file  Social Connections: Not on file    Family History  Problem Relation Age of Onset  . Heart failure Mother        Long-standing CAD  . Heart attack Mother        First MI in 93s  . Coronary artery disease Mother   . Diabetes Mother   . Colon polyps Mother   . Coronary artery disease Sister 64  . Colon cancer Neg Hx   . Esophageal cancer Neg Hx     Health Maintenance  Topic Date Due  . COVID-19 Vaccine (3 - Booster for Pfizer series) 08/19/2020  . INFLUENZA VACCINE  02/09/2021 (Originally 06/12/2020)  . TETANUS/TDAP  10/11/2021 (Originally 09/19/1981)  . MAMMOGRAM  09/14/2022  . PAP SMEAR-Modifier  09/23/2023  . COLONOSCOPY (Pts 45-27yrs Insurance coverage will need to be confirmed)  09/10/2027  .  Hepatitis C Screening  Completed  . HIV Screening  Completed     ----------------------------------------------------------------------------------------------------------------------------------------------------------------------------------------------------------------- Physical Exam BP (!) 145/85 (BP Location: Left Arm, Patient Position: Sitting, Cuff Size: Large)   Pulse 78   Wt 259 lb (117.5 kg)   BMI 40.57 kg/m   Physical Exam Constitutional:      Appearance: Normal appearance.  Cardiovascular:     Rate and Rhythm: Normal rate and regular rhythm.  Pulmonary:     Effort: Pulmonary effort is normal.     Breath sounds: Normal breath sounds.  Musculoskeletal:     Comments: Mild bilateral LE edema. TTP along L calf with positive homan sign.  TTP along R ankle without significant swelling or warmth.    Neurological:     General: No focal deficit present.     Mental Status: She is alert.  Psychiatric:        Mood and Affect: Mood normal.        Behavior: Behavior normal.     ------------------------------------------------------------------------------------------------------------------------------------------------------------------------------------------------------------------- Assessment and Plan  Bilateral leg edema Pain in her legs is likely multifactorial  Venous doppler of lower extremities ordered.  She has had some warm, swollen joints will check uric acid.  Lasix refilled to use as needed for edema.    Meds ordered this encounter  Medications  . furosemide (LASIX) 20 MG tablet    Sig: Take 0.5-1 tablets (10-20 mg total) by mouth daily as needed.    Dispense:  30 tablet    Refill:  0    No follow-ups on file.    This visit occurred during the SARS-CoV-2 public health emergency.  Safety protocols were in place, including screening questions prior to the visit, additional usage of staff PPE, and extensive cleaning of exam room while observing  appropriate contact time as indicated for disinfecting solutions.

## 2021-01-02 NOTE — Patient Instructions (Signed)
Great to meet you! Have labs completed Stop downstairs and schedule ultrasound.

## 2021-01-02 NOTE — Telephone Encounter (Signed)
Morgan Maddox has patient scheduled for today. AM

## 2021-01-02 NOTE — Assessment & Plan Note (Signed)
Pain in her legs is likely multifactorial  Venous doppler of lower extremities ordered.  She has had some warm, swollen joints will check uric acid.  Lasix refilled to use as needed for edema.

## 2021-01-03 ENCOUNTER — Ambulatory Visit (INDEPENDENT_AMBULATORY_CARE_PROVIDER_SITE_OTHER): Payer: BC Managed Care – PPO

## 2021-01-03 ENCOUNTER — Encounter: Payer: Self-pay | Admitting: Family Medicine

## 2021-01-03 DIAGNOSIS — M79661 Pain in right lower leg: Secondary | ICD-10-CM

## 2021-01-03 DIAGNOSIS — R2242 Localized swelling, mass and lump, left lower limb: Secondary | ICD-10-CM | POA: Diagnosis not present

## 2021-01-03 DIAGNOSIS — I82441 Acute embolism and thrombosis of right tibial vein: Secondary | ICD-10-CM | POA: Diagnosis not present

## 2021-01-03 DIAGNOSIS — R600 Localized edema: Secondary | ICD-10-CM

## 2021-01-03 DIAGNOSIS — M79662 Pain in left lower leg: Secondary | ICD-10-CM

## 2021-01-03 DIAGNOSIS — I82811 Embolism and thrombosis of superficial veins of right lower extremities: Secondary | ICD-10-CM

## 2021-01-03 LAB — URIC ACID: Uric Acid, Serum: 4.4 mg/dL (ref 2.5–7.0)

## 2021-01-03 MED ORDER — APIXABAN (ELIQUIS) VTE STARTER PACK (10MG AND 5MG)
ORAL_TABLET | ORAL | 0 refills | Status: DC
Start: 2021-01-03 — End: 2021-01-31

## 2021-01-06 ENCOUNTER — Encounter: Payer: Self-pay | Admitting: Physician Assistant

## 2021-01-06 ENCOUNTER — Other Ambulatory Visit: Payer: Self-pay

## 2021-01-06 ENCOUNTER — Ambulatory Visit: Payer: BC Managed Care – PPO | Admitting: Physician Assistant

## 2021-01-06 ENCOUNTER — Ambulatory Visit (INDEPENDENT_AMBULATORY_CARE_PROVIDER_SITE_OTHER): Payer: BC Managed Care – PPO

## 2021-01-06 VITALS — BP 130/74 | HR 77 | Ht 67.0 in | Wt 270.0 lb

## 2021-01-06 DIAGNOSIS — R0602 Shortness of breath: Secondary | ICD-10-CM

## 2021-01-06 DIAGNOSIS — I82441 Acute embolism and thrombosis of right tibial vein: Secondary | ICD-10-CM

## 2021-01-06 DIAGNOSIS — Z8616 Personal history of COVID-19: Secondary | ICD-10-CM

## 2021-01-06 DIAGNOSIS — R059 Cough, unspecified: Secondary | ICD-10-CM

## 2021-01-06 DIAGNOSIS — F418 Other specified anxiety disorders: Secondary | ICD-10-CM

## 2021-01-06 DIAGNOSIS — R4589 Other symptoms and signs involving emotional state: Secondary | ICD-10-CM

## 2021-01-06 MED ORDER — APIXABAN 5 MG PO TABS
5.0000 mg | ORAL_TABLET | Freq: Two times a day (BID) | ORAL | 1 refills | Status: DC
Start: 2021-01-06 — End: 2021-03-30

## 2021-01-06 NOTE — Progress Notes (Signed)
Subjective:    Patient ID: Morgan Maddox, female    DOB: 10-27-1961, 60 y.o.   MRN: [id number]  HPI  Pt is a 60 yo obese female with recent history of covid that was dx with DVT or right posterior tibial vein on 01/03/2021. She was started on eliquis. She is feeling much better today but has lots of questions.   She admits to having covid and then flying which could have cause the clot. Never had a blood clot.   She quit smoking in December 2018.   She does having ongoing dry to productive cough since covid. She is not taking anything for it. Some SOB.      [name]. Active Ambulatory Problems    Diagnosis Date Noted  . History of mononucleosis 04/21/2013  . 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 History of D&C 04/21/2013  . Mixed hyperlipidemia 04/21/2013  . Elevated blood pressure reading 03/23/2015  . Vitamin D deficiency 03/23/2015  . Medication management 03/23/2015  . Morbid obesity (BMI 43.44)  03/23/2015  . Costochondritis 03/04/2016  . Prediabetes 04/30/2016  . 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 Poor compliance with medication 10/08/2016  . Subacute maxillary sinusitis 09/05/2018  . Atypical chest pain 09/05/2019  . Post-traumatic osteoarthritis of left knee 10/05/2020  . Tinnitus of right ear 01/02/2021  . Cerebrospinal fluid leak 06/27/2020  . Conductive hearing loss 06/27/2020  . Bilateral leg edema 01/02/2021  . Acute deep vein thrombosis (DVT) of tibial vein of right lower extremity ([place]) 01/06/2021   Resolved Ambulatory Problems    Diagnosis Date Noted  . No Resolved Ambulatory Problems   Past Medical History:  Diagnosis Date  . Anxiety   . Bilateral ovarian cysts   . Chronic headache   . Family history of premature CAD   . History of sinus surgery 04/21/2013  . Hypertriglyceridemia 04/21/2013  . Morbid obesity with BMI of 40.0-44.9, adult ([place])   . Obesity   . Pseudotumor cerebri 08/2017      Review of Systems  Constitutional: Positive for  fatigue. Negative for appetite change, chills, diaphoresis and fever.  HENT: Positive for congestion and ear pain. Negative for drooling and ear discharge.   Respiratory: Positive for cough, chest tightness and shortness of breath.   Genitourinary: Negative.   Psychiatric/Behavioral: The patient is nervous/anxious.   All other systems reviewed and are negative.      Objective:   Physical Exam Vitals reviewed.  Constitutional:      Appearance: Normal appearance.  HENT:     Head: Normocephalic.  Cardiovascular:     Rate and Rhythm: Normal rate and regular rhythm.     Pulses: Normal pulses.     Heart sounds: Normal heart sounds.  Pulmonary:     Effort: Pulmonary effort is normal.     Comments: Coarse breath sounds with cough Musculoskeletal:        General: Tenderness present. No swelling, deformity or signs of injury.     Right lower leg: No edema.     Left lower leg: No edema.     Comments: ROM of bilateral ankles and legs.  No edema.  Some tenderness to palpation in both legs.  No bruising or redness.  Negative homans sign.   Neurological:     General: No focal deficit present.     Mental Status: She is alert and oriented to person, place, and time.  Psychiatric:     Comments: anxious  Assessment & Plan:  Marland KitchenMarland KitchenClara was seen today for leg pain.  Diagnoses and all orders for this visit:  Acute deep vein thrombosis (DVT) of tibial vein of right lower extremity ([facility]) -     apixaban (ELIQUIS) 5 MG TABS tablet; Take 1 tablet (5 mg total) by mouth 2 (two) times daily.  Cough -     DG Chest 2 View  SOB (shortness of breath) -     DG Chest 2 View  History of COVID-19 -     DG Chest 2 View  Anxiety about health  discussed blood clots and risk factors. She had just had covid and she flew. Likely the cause. Will stay on eliquis for at least 3 months.   Bilateral swelling resolved. Do not take lasix.  Redness and warmth resolved.   Reassured lungs sounded  good. Some course breath sounds with coughing. Will get CXR for cough. Likely more of a post viral cough. Discussed symptomatic care. Declines prednisone or albuterol because she does not like the side effects of feeling anxiety/jittery.   Follow up with PCP in 1 month.   Ask when to get booster. At least 30 days after covid.   Spent 30 minutes with patient reviewing chart, discussing DVT and causes, treatment, risk factors and discussing treatment plan for future.

## 2021-01-06 NOTE — Patient Instructions (Signed)
Deep Vein Thrombosis  Deep vein thrombosis (DVT) is a condition in which a blood clot forms in a deep vein, such as a vein in the lower leg, thigh, pelvis, or arm. Deep veins are veins in the deep venous system. A clot is blood that has thickened into a gel or solid. This condition is serious and can be life-threatening if the clot travels to the lungs and causes a blockage (pulmonary embolism) in the arteries of the lung. A DVT can also damage veins in the leg. This can lead to long-term, or chronic, venous disease, leg pain, swelling, discoloration, and ulcers or sores (post-thrombotic syndrome). What are the causes? This condition may be caused by:  A slowdown of blood flow.  Damage to a vein.  A condition that causes blood to clot more easily, such as certain blood-clotting disorders. What increases the risk? The following factors may make you more likely to develop this condition:  Having obesity.  Being older, especially older than age 60.  Being inactive (sedentary lifestyle) or not moving around. This may include: ? Sitting or lying down for longer than 4-6 hours other than to sleep at night. ? Being in the hospital, having major or lengthy surgery, or having a thin, flexible tube (central line catheter) placed in a large vein.  Being pregnant, giving birth, or having recently given birth.  Taking medicines that contain estrogen, such as birth control or hormone replacement therapy.  Using products that contain nicotine or tobacco, especially if you use hormonal birth control.  Having a history of blood clots or a blood-clotting disease, a blood vessel disease (peripheral vascular disease), or congestive heart disease.  Having a history of cancer, especially if being treated with chemotherapy. What are the signs or symptoms? Symptoms of this condition include:  Swelling, pain, pressure, or tenderness in an arm or a leg.  An arm or a leg becoming warm, red, or  discolored.  A leg turning very pale. You may have a large DVT. This is rare. If the clot is in your leg, you may notice symptoms more or have worse symptoms when you stand or walk. In some cases, there are no symptoms. How is this diagnosed? This condition is diagnosed with:  Your medical history and a physical exam.  Tests, such as: ? Blood tests to check how well your blood clots. ? Doppler ultrasound. This is the best way to find a DVT. ? Venogram. Contrast dye is injected into a vein, and X-rays are taken to check for clots. How is this treated? Treatment for this condition depends on:  The cause of your DVT.  The size and location of your DVT, or having more than one DVT.  Your risk for bleeding or developing more clots.  Other medical conditions you may have. Treatment may include:  Taking a blood thinner, also called an anticoagulant, to prevent clots from forming and growing.  Wearing compression stockings, if directed.  Injecting medicines into the affected vein to break up the clot (catheter-directed thrombolysis). This is used only for severe DVT and only if a specialist recommends it.  Specific surgical procedures, when DVT is severe or hard to treat. These may be done to: ? Isolate and remove your clot. ? Place an inferior vena cava (IVC) filter in a large vein to catch blood clots before they reach your lungs. You may get some medical treatments for 6 months or longer. Follow these instructions at home: If you are taking blood thinners:    Talk with your health care provider before you take any medicines that contain aspirin or NSAIDs, such as ibuprofen. These medicines increase your risk for dangerous bleeding.  Take your medicine exactly as told, at the same time every day. Do not skip a dose. Do not take more than the prescribed dose. This is important.  Ask your health care provider about foods and medicines that could change the way your blood thinner  works (may interact). Avoid these foods and medicines if you are told to do so.  Avoid anything that may cause bleeding or bruising. You may bleed more easily while taking blood thinners. ? Be very careful when using knives, scissors, or other sharp objects. ? Use an electric razor instead of a blade. ? Avoid activities that could cause injury or bruising, and follow instructions for preventing falls. ? Tell your health care provider if you have had any internal bleeding, bleeding ulcers, or neurologic diseases, such as strokes or cerebral aneurysms.  Wear a medical alert bracelet or carry a card that lists what medicines you take. General instructions  Take over-the-counter and prescription medicines only as told by your health care provider.  Return to your normal activities as told by your health care provider. Ask your health care provider what activities are safe for you.  If recommended, wear compression stockings as told by your health care provider. These stockings help to prevent blood clots and reduce swelling in your legs.  Keep all follow-up visits as told by your health care provider. This is important. Contact a health care provider if:  You miss a dose of your blood thinner.  You have new or worse pain, swelling, or redness in an arm or a leg.  You have worsening numbness or tingling in an arm or a leg.  You have unusual bruising. Get help right away if:  You have signs or symptoms that a blood clot has moved to the lungs. These may include: ? Shortness of breath. ? Chest pain. ? Fast or irregular heartbeats (palpitations). ? Light-headedness or dizziness. ? Coughing up blood.  You have signs or symptoms that your blood is too thin. These may include: ? Blood in your vomit, stool, or urine. ? A cut that will not stop bleeding. ? A menstrual period that is heavier than usual. ? A severe headache or confusion. These symptoms may represent a serious problem that  is an emergency. Do not wait to see if the symptoms will go away. Get medical help right away. Call your local emergency services (911 in the [place] Do not drive yourself to the hospital. Summary  Deep vein thrombosis (DVT) happens when a blood clot forms in a deep vein. This may occur in the lower leg, thigh, pelvis, or arm.  Symptoms affect the arm or leg and can include swelling, pain, tenderness, warmth, redness, or discoloration.  This condition may be treated with medicines or compression stockings. In severe cases, surgery may be done.  If you are taking blood thinners, take them exactly as told. Do not skip a dose. Do not take more than is prescribed.  Get help right away if you have shortness of breath, chest pain, fast or irregular heartbeats, or blood in your vomit, urine, or stool. This information is not intended to replace advice given to you by your health care provider. Make sure you discuss any questions you have with your health care provider. Document Revised: 10/24/2019 Document Reviewed: 10/24/2019 Elsevier Patient Education  2021 [employer]   [employer]  

## 2021-01-06 NOTE — Progress Notes (Signed)
Normal CXR. Lungs clear.

## 2021-01-15 ENCOUNTER — Encounter: Payer: Self-pay | Admitting: Osteopathic Medicine

## 2021-01-31 ENCOUNTER — Other Ambulatory Visit: Payer: Self-pay

## 2021-01-31 ENCOUNTER — Encounter: Payer: Self-pay | Admitting: Osteopathic Medicine

## 2021-01-31 ENCOUNTER — Ambulatory Visit: Payer: BC Managed Care – PPO | Admitting: Osteopathic Medicine

## 2021-01-31 VITALS — BP 140/82 | HR 76 | Resp 20 | Ht 67.0 in | Wt 272.0 lb

## 2021-01-31 DIAGNOSIS — R7303 Prediabetes: Secondary | ICD-10-CM | POA: Diagnosis not present

## 2021-01-31 DIAGNOSIS — Z8616 Personal history of COVID-19: Secondary | ICD-10-CM | POA: Diagnosis not present

## 2021-01-31 DIAGNOSIS — G4762 Sleep related leg cramps: Secondary | ICD-10-CM | POA: Diagnosis not present

## 2021-01-31 DIAGNOSIS — I824Y1 Acute embolism and thrombosis of unspecified deep veins of right proximal lower extremity: Secondary | ICD-10-CM

## 2021-01-31 MED ORDER — AMBULATORY NON FORMULARY MEDICATION: 99 refills | Status: AC

## 2021-01-31 NOTE — Patient Instructions (Addendum)
Continue blood thinners until Monday, May 23 (90 days therapy total)  Repeat ultrasound in May 20th or so to ensure DVT has gone  Rx compression stockings   Labs today to evaluate for cramps If all normal, might consider Rx at bedtime to prevent Compression stocking through the day may help this as well

## 2021-01-31 NOTE — Progress Notes (Signed)
Morgan Maddox is a 60 y.o. female who presents to  [facility]  today, 01/31/21, seeking care for the following:   Follow up DVT - seen by a colleague in my office 01/02/21 when I was unavailable. C/o bilateral calf pain. Known longstanding leg pain, had been recently following w/ orthopedics for stress fracture L tibial plateau (last seen by ortho 12/27/20). [place] ordered that visit, (+)DVT 01/03/21 R posterior tibial vein w/ superficial venous thrombus noted in the right greater saphenous vein diagnosed. Started on Eliquis. Followed up w/ [facility] again 01/06/21 to answer some questions - concern for post-COVID syndrome and recent flight potentially provoking DVT.   Today reports persistent intermittent LE swelling toward end of day, bilaterally. Reports bilateral LE cramping most nights waking her from sleep. Wants to discuss potential treatments / evaluation.      ASSESSMENT & PLAN with other pertinent findings:  The primary encounter diagnosis was Acute deep vein thrombosis (DVT) of proximal vein of right lower extremity ([place]). Diagnoses of Cramp in lower extremity associated with sleep, History of COVID-19, and Prediabetes were also pertinent to this visit.    Patient Instructions  Continue blood thinners until Monday, May 23 (90 days therapy total)  Repeat ultrasound in May 20th or so to ensure DVT has gone  Rx compression stockings   Labs today to evaluate for cramps If all normal, might consider Rx at bedtime to prevent Compression stocking through the day may help this as well        Orders Placed This Encounter  Procedures  . US Venous Img Lower Unilateral Right  . COMPLETE METABOLIC PANEL WITH GFR  . Hemoglobin A1c  . TSH  . Magnesium    Meds ordered this encounter  Medications  . AMBULATORY NON FORMULARY MEDICATION    Sig: Medication Name: Compression stockings, please measure to fit, 20-30 mm Hg     Dispense:  1 Units    Refill:  99     See below for relevant physical exam findings  See below for recent lab and imaging results reviewed  Medications, allergies, PMH, PSH, SocH, FamH reviewed below    Follow-up instructions: Return for RECHECK PENDING RESULTS / IF WORSE OR CHANGE. ANNUAL DUE 09/2021.                                       Exam:  BP 140/82 (BP Location: Left Arm, Patient Position: Sitting, Cuff Size: Normal)   Pulse 76   Resp 20   Ht 5\' 7"  (1.702 m)   Wt 272 lb (123.4 kg)   SpO2 95%   BMI 42.60 kg/m   Constitutional: VS see above. General Appearance: alert, well-developed, well-nourished, NAD  Neck: No masses, trachea midline.   Respiratory: Normal respiratory effort. no wheeze, no rhonchi, no rales  Cardiovascular: S1/S2 normal, no murmur, no rub/gallop auscultated. RRR.   Musculoskeletal: Gait normal. Symmetric and independent movement of all extremities  Neurological: Normal balance/coordination. No tremor.  Skin: warm, dry, intact.   Psychiatric: Normal judgment/insight. Normal mood and affect. Oriented x3.   Current Meds  Medication Sig  . AMBULATORY NON FORMULARY MEDICATION Medication Name: Compression stockings, please measure to fit, 20-30 mm Hg  . apixaban (ELIQUIS) 5 MG TABS tablet Take 1 tablet (5 mg total) by mouth 2 (two) times daily.  . calcium-vitamin D (OSCAL WITH D)  500-200 MG-UNIT tablet Take 1 tablet by mouth 3 (three) times daily.  . clobetasol (TEMOVATE) 0.05 % external solution Apply 1 application topically as needed.   . desonide (DESOWEN) 0.05 % cream Apply 1 application topically as needed.   . furosemide (LASIX) 20 MG tablet Take 0.5-1 tablets (10-20 mg total) by mouth daily as needed.  [name] HYDROcodone-acetaminophen (NORCO) 5-325 MG tablet Take 1-2 tablets by mouth daily as needed.  [name] ketoconazole (NIZORAL) 2 % shampoo Apply 1 application topically as needed.   . traMADol (ULTRAM) 50 MG  tablet Take 1 tablet (50 mg total) by mouth 3 (three) times daily as needed.  . traZODone (DESYREL) 50 MG tablet Take 1 tablet (50 mg total) by mouth at bedtime as needed for sleep.  . [DISCONTINUED] APIXABAN (ELIQUIS) VTE STARTER PACK (10MG  AND 5MG ) Take as directed on package: start with two-5mg  tablets twice daily for 7 days. On day 8, switch to one-5mg  tablet twice daily.    Allergies  Allergen Reactions  . Calamine Rash  . Pramoxine-Calamine Rash and Other (See Comments)  . Versed [Midazolam]   . Diphenhydramine Hcl Rash    Patient Active Problem List   Diagnosis Date Noted  . Acute deep vein thrombosis (DVT) of tibial vein of right lower extremity ([place]) 01/06/2021  . Tinnitus of right ear 01/02/2021  . Bilateral leg edema 01/02/2021  . Post-traumatic osteoarthritis of left knee 10/05/2020  . Cerebrospinal fluid leak 06/27/2020  . Conductive hearing loss 06/27/2020  . Atypical chest pain 09/05/2019  . Subacute maxillary sinusitis 09/05/2018  . Poor compliance with medication 10/08/2016  . 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 Prediabetes 04/30/2016  . Costochondritis 03/04/2016  . Elevated blood pressure reading 03/23/2015  . Vitamin D deficiency 03/23/2015  . Medication management 03/23/2015  . Morbid obesity (BMI 43.44)  03/23/2015  . History of mononucleosis 04/21/2013  . 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 History of D&C 04/21/2013  . Mixed hyperlipidemia 04/21/2013    Family History  Problem Relation Age of Onset  . Heart failure Mother        Long-standing CAD  . Heart attack Mother        First MI in 40s  . Coronary artery disease Mother   . Diabetes Mother   . Colon polyps Mother   . Coronary artery disease Sister 19  . Colon cancer Neg Hx   . Esophageal cancer Neg Hx     Social History   Tobacco Use  Smoking Status Former Smoker  . Packs/day: 0.50  . Years: 25.00  . Pack years: 12.50  . Types: Cigarettes  Smokeless Tobacco Never Used     Past Surgical History:  Procedure Laterality Date  . MASTOIDECTOMY    . NASAL SINUS SURGERY    . NM MYOVIEW LTD  02/09/2016   : EF 76%. Periapical defect with partial improvement during rest. No wall motion abnormality noted. Likely related to breast attenuation, cannot exclude small area of periapical ischemia. --> Cardiac catheterization not recommended. LOW RISK  . OVARIAN CYST SURGERY    . TONSILLECTOMY    . Transverse Sinus Stent  07/2017   [facility] - > for IHH/pseudotumor cerebri    Immunization History  Administered Date(s) Administered  . PFIZER(Purple Top)SARS-COV-2 Vaccination 01/29/2020, 02/18/2020  . PPD Test 03/23/2015    Recent Results (from the past 2160 hour(s))  Uric acid     Status: None   Collection Time: 01/02/21 12:00 AM  Result Value Ref Range  Uric Acid, Serum 4.4 2.5 - 7.0 mg/dL    Comment: Therapeutic target for gout patients: <6.0 mg/dL .     No results found.     All questions at time of visit were answered - patient instructed to contact office with any additional concerns or updates. ER/RTC precautions were reviewed with the patient as applicable.   Please note: manual typing as well as voice recognition software may have been used to produce this document - typos may escape review. Please contact Dr. Sheppard Coil for any needed clarifications.   Total encounter time on date of service, 01/31/21, was 40 minutes spent addressing problems/issues as noted above in [place], including time spent in discussion with patient regarding the HPI, ROS, confirming history, reviewing Assessment & Plan, as well as time spent on coordination of care, record review.

## 2021-02-01 LAB — COMPLETE METABOLIC PANEL WITH GFR
AG Ratio: 1.9 (calc) (ref 1.0–2.5)
ALT: 13 U/L (ref 6–29)
AST: 12 U/L (ref 10–35)
Albumin: 4.5 g/dL (ref 3.6–5.1)
Alkaline phosphatase (APISO): 91 U/L (ref 37–153)
BUN: 14 mg/dL (ref 7–25)
CO2: 29 mmol/L (ref 20–32)
Calcium: 9.9 mg/dL (ref 8.6–10.4)
Chloride: 101 mmol/L (ref 98–110)
Creat: 0.74 mg/dL (ref 0.50–1.05)
GFR, Est African American: 103 mL/min/{1.73_m2} (ref >=60)
GFR, Est Non African American: 89 mL/min/{1.73_m2} (ref >=60)
Globulin: 2.4 g/dL (calc) (ref 1.9–3.7)
Glucose, Bld: 98 mg/dL (ref 65–99)
Potassium: 4.7 mmol/L (ref 3.5–5.3)
Sodium: 139 mmol/L (ref 135–146)
Total Bilirubin: 0.5 mg/dL (ref 0.2–1.2)
Total Protein: 6.9 g/dL (ref 6.1–8.1)

## 2021-02-01 LAB — HEMOGLOBIN A1C
Hgb A1c MFr Bld: 5.8 % of total Hgb — ABNORMAL HIGH (ref <5.7)
Mean Plasma Glucose: 120 mg/dL
eAG (mmol/L): 6.6 mmol/L

## 2021-02-01 LAB — MAGNESIUM: Magnesium: 2.1 mg/dL (ref 1.5–2.5)

## 2021-02-01 LAB — TSH: TSH: 1.23 mIU/L (ref 0.40–4.50)

## 2021-02-07 ENCOUNTER — Ambulatory Visit: Payer: BC Managed Care – PPO | Admitting: Orthopaedic Surgery

## 2021-02-07 ENCOUNTER — Encounter: Payer: Self-pay | Admitting: Orthopaedic Surgery

## 2021-02-07 VITALS — Ht 67.0 in | Wt 272.0 lb

## 2021-02-07 DIAGNOSIS — G8929 Other chronic pain: Secondary | ICD-10-CM

## 2021-02-07 DIAGNOSIS — M25562 Pain in left knee: Secondary | ICD-10-CM

## 2021-02-07 NOTE — Progress Notes (Signed)
Office Visit Note   Patient: Morgan Maddox           Date of Birth: Mar 18, 1961           MRN: [id number] Visit Date: 02/07/2021              Requested by: Emeterio Reeve, [address] [place],  [place] [address] PCP: Emeterio Reeve, DO   Assessment & Plan: Visit Diagnoses:  1. Chronic pain of left knee     Plan: Impression is left knee insufficiency fracture lateral tibial plateau and tricompartmental degenerative changes now with new, multiple bilateral lower extremity DVTs.  I believe that she needs to give this more time to see if she will notice improvement.  We have discussed wearing her brace if she walks any distance even if just going to the grocery store.  She will continue to ice her knee is much as possible.  She will follow up with [place] in 8 weeks time for recheck.  Call with concerns or questions.  Follow-Up Instructions: Return in about 8 weeks (around 04/04/2021).   Orders:  No orders of the defined types were placed in this encounter.  No orders of the defined types were placed in this encounter.     Procedures: No procedures performed   Clinical Data: No additional findings.   Subjective: Chief Complaint  Patient presents with  . Left Knee - Pain    HPI patient is a pleasant 60 year old female who comes in today for follow-up of her left knee pain.  She has been seeing [place] for this in the past few months.  MRI of the left knee showed lateral tibial plateau insufficiency fracture as well as moderate tricompartmental degenerative changes.  She has only been wearing her DJD unloader brace if she walks for long distances which she rarely does.  Overall, she does not feel her pain has improved.  Previous cortisone injection was of no relief.  She does note that over the past month, she has been diagnosed with multiple bilateral lower extremity DVTs.  She is currently on Eliquis for this.  Her bilateral lower legs do cause her significant  pain.  Review of Systems as detailed in HPI.  All others reviewed and are negative.   Objective: Vital Signs: Ht 5\' 7"  (1.702 m)   Wt 272 lb (123.4 kg)   BMI 42.60 kg/m   Physical Exam well-developed well-nourished female no acute distress.  Alert oriented x3.  Ortho Exam left knee exam shows moderate swelling.  Lateral joint line tenderness.  Range of motion from 0 to 95 degrees.  She is neurovascularly intact distally.  Specialty Comments:  No specialty comments available.  Imaging: No new imaging   PMFS History: Patient Active Problem List   Diagnosis Date Noted  . Acute deep vein thrombosis (DVT) of tibial vein of right lower extremity ([place]) 01/06/2021  . Tinnitus of right ear 01/02/2021  . Bilateral leg edema 01/02/2021  . Post-traumatic osteoarthritis of left knee 10/05/2020  . Cerebrospinal fluid leak 06/27/2020  . Conductive hearing loss 06/27/2020  . Atypical chest pain 09/05/2019  . Subacute maxillary sinusitis 09/05/2018  . Poor compliance with medication 10/08/2016  . 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 Prediabetes 04/30/2016  . Costochondritis 03/04/2016  . Elevated blood pressure reading 03/23/2015  . Vitamin D deficiency 03/23/2015  . Medication management 03/23/2015  . Morbid obesity (BMI 43.44)  03/23/2015  . History of mononucleosis 04/21/2013  . Mucocele  of nasal sinus 04/21/2013  . History of D&C 04/21/2013  . Mixed hyperlipidemia 04/21/2013   Past Medical History:  Diagnosis Date  . Anxiety   . Bilateral ovarian cysts   . Chronic headache   . Family history of premature CAD    Brother had an MI, sister has CAD. Mother had an MI at age 17. Still alive at 4.  [name] History of mononucleosis 04/21/2013  . History of sinus surgery 04/21/2013  . Hypertriglyceridemia 04/21/2013  . Morbid obesity with BMI of 40.0-44.9, adult ([place])    BMI roughly 43 noted on 02/09/2016 admission [name])  . 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 Obesity   .  Pseudotumor cerebri 08/2017   Idiopathic intracerebral hypertension -> s/p right transverse sinus stenting  . Vitamin D deficiency     Family History  Problem Relation Age of Onset  . Heart failure Mother        Long-standing CAD  . Heart attack Mother        First MI in 59s  . Coronary artery disease Mother   . Diabetes Mother   . Colon polyps Mother   . Coronary artery disease Sister 10  . Colon cancer Neg Hx   . Esophageal cancer Neg Hx     Past Surgical History:  Procedure Laterality Date  . MASTOIDECTOMY    . NASAL SINUS SURGERY    . NM MYOVIEW LTD  02/09/2016   [place]: EF 76%. Periapical defect with partial improvement during rest. No wall motion abnormality noted. Likely related to breast attenuation, cannot exclude small area of periapical ischemia. --> Cardiac catheterization not recommended. LOW RISK  . OVARIAN CYST SURGERY    . TONSILLECTOMY    . Transverse Sinus Stent  07/2017   [facility] - > for IHH/pseudotumor cerebri   Social History   Occupational History  . Occupation: [employer]    Employer: [employer]  Tobacco Use  . Smoking status: Former Smoker    Packs/day: 0.50    Years: 25.00    Pack years: 12.50    Types: Cigarettes  . Smokeless tobacco: Never Used  Vaping Use  . Vaping Use: Never used  Substance and Sexual Activity  . Alcohol use: Yes    Alcohol/week: 0.0 standard drinks    Comment: social  . Drug use: No  . Sexual activity: Not Currently

## 2021-03-14 ENCOUNTER — Encounter: Payer: Self-pay | Admitting: Osteopathic Medicine

## 2021-03-15 ENCOUNTER — Encounter: Payer: Self-pay | Admitting: Osteopathic Medicine

## 2021-03-15 ENCOUNTER — Telehealth (INDEPENDENT_AMBULATORY_CARE_PROVIDER_SITE_OTHER): Payer: BC Managed Care – PPO | Admitting: Osteopathic Medicine

## 2021-03-15 DIAGNOSIS — R0789 Other chest pain: Secondary | ICD-10-CM | POA: Diagnosis not present

## 2021-03-15 DIAGNOSIS — I82441 Acute embolism and thrombosis of right tibial vein: Secondary | ICD-10-CM

## 2021-03-15 DIAGNOSIS — U099 Post covid-19 condition, unspecified: Secondary | ICD-10-CM | POA: Diagnosis not present

## 2021-03-15 MED ORDER — FEXOFENADINE HCL 180 MG PO TABS: 180.0000 mg | ORAL_TABLET | Freq: Every day | ORAL | 0 refills | Status: DC

## 2021-03-15 MED ORDER — ADVAIR HFA 115-21 MCG/ACT IN AERO: 2.0000 | INHALATION_SPRAY | Freq: Two times a day (BID) | RESPIRATORY_TRACT | 0 refills | Status: DC

## 2021-03-15 NOTE — Telephone Encounter (Signed)
I called and scheduled patient.  

## 2021-03-15 NOTE — Progress Notes (Addendum)
Telemedicine VIDEO AND AUDIO Visit via MyChart   I connected with [name] on 03/15/21 at 3:33 PM  by phone or  telemedicine application as noted above  I verified that I am speaking with or regarding  the correct patient using two identifiers.  Participants: Myself, Dr Emeterio Reeve DO Patient: Morgan Maddox Patient proxy if applicable: none Other, if applicable: none  Patient is at home I am in office at [facility]    I discussed the limitations of evaluation and management  by telemedicine and the availability of in person appointments.  The participant(s) above expressed understanding and  agreed to proceed with this appointment via telemedicine.       History of Present Illness: Morgan Maddox is a 60 y.o. female who would like to discuss feeling sinus congestion and chest congestion worse past few weeks, concerned given post-COVID syndrome, has been coughing  A lot and coughed up blood streaked mucus. She is taking eliquis for recent diagnosis DVT.        Observations/Objective: There were no vitals taken for this visit. BP Readings from Last 3 Encounters:  01/31/21 140/82  01/06/21 130/74  01/02/21 (!) 145/85   Exam: Normal Speech.  NAD  Lab and Radiology Results No results found for this or any previous visit (from the past 72 hour(s)). No results found.     Assessment and Plan: 60 y.o. female with The primary encounter diagnosis was Post covid-19 condition, unspecified. Diagnoses of Atypical chest pain and Acute deep vein thrombosis (DVT) of tibial vein of right lower extremity ([place]) were also pertinent to this visit.  If concerning coughing blood and/or shortness of breath, to ER! Pt advised diagnostic ability limited on this via virtual, [place] and cnanot come to clinic for in person eval meds as below, RTC for visit when in town if symptoms persist   PDMP not reviewed this encounter. No orders  of the defined types were placed in this encounter.  Meds ordered this encounter  Medications  . fexofenadine (ALLEGRA) 180 MG tablet    Sig: Take 1 tablet (180 mg total) by mouth daily.    Dispense:  90 tablet    Refill:  0  . fluticasone-salmeterol (ADVAIR HFA) 115-21 MCG/ACT inhaler    Sig: Inhale 2 puffs into the lungs 2 (two) times daily.    Dispense:  1 each    Refill:  0   There are no Patient Instructions on file for this visit.  Instructions sent via MyChart.   Follow Up Instructions: No follow-ups on file.    I discussed the assessment and treatment plan with the patient. The patient was provided an opportunity to ask questions and all were answered. The patient agreed with the plan and demonstrated an understanding of the instructions.   The patient was advised to call back or seek an in-person evaluation if any new concerns, if symptoms worsen or if the condition fails to improve as anticipated.  30 minutes of non-face-to-face time was provided during this encounter.      . . . . . . . . . . . . . [name]                   Historical information moved to improve visibility of documentation.  Past Medical History:  Diagnosis Date  . Anxiety   . Bilateral ovarian cysts   . Chronic headache   . Family history of premature CAD  Brother had an MI, sister has CAD. Mother had an MI at age 53. Still alive at 13.  [name] History of mononucleosis 04/21/2013  . History of sinus surgery 04/21/2013  . Hypertriglyceridemia 04/21/2013  . Morbid obesity with BMI of 40.0-44.9, adult ([place])    BMI roughly 43 noted on 02/09/2016 admission [name])  . 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 Obesity   . Pseudotumor cerebri 08/2017   Idiopathic intracerebral hypertension -> s/p right transverse sinus stenting  . Vitamin D deficiency    Past Surgical History:  Procedure Laterality Date  . MASTOIDECTOMY    . NASAL SINUS SURGERY    . NM MYOVIEW LTD   02/09/2016   [place]: EF 76%. Periapical defect with partial improvement during rest. No wall motion abnormality noted. Likely related to breast attenuation, cannot exclude small area of periapical ischemia. --> Cardiac catheterization not recommended. LOW RISK  . OVARIAN CYST SURGERY    . TONSILLECTOMY    . Transverse Sinus Stent  07/2017   [facility] - > for IHH/pseudotumor cerebri   Social History   Tobacco Use  . Smoking status: Former Smoker    Packs/day: 0.50    Years: 25.00    Pack years: 12.50    Types: Cigarettes  . Smokeless tobacco: Never Used  Substance Use Topics  . Alcohol use: Yes    Alcohol/week: 0.0 standard drinks    Comment: social   family history includes Colon polyps in her mother; Coronary artery disease in her mother; Coronary artery disease (age of onset: 45) in her sister; Diabetes in her mother; Heart attack in her mother; Heart failure in her mother.  Medications: Current Outpatient Medications  Medication Sig Dispense Refill  . AMBULATORY NON FORMULARY MEDICATION Medication Name: Compression stockings, please measure to fit, 20-30 mm Hg 1 Units 99  . apixaban (ELIQUIS) 5 MG TABS tablet Take 1 tablet (5 mg total) by mouth 2 (two) times daily. 60 tablet 1  . calcium-vitamin D (OSCAL WITH D) 500-200 MG-UNIT tablet Take 1 tablet by mouth 3 (three) times daily. 90 tablet 6  . clobetasol (TEMOVATE) 0.05 % external solution Apply 1 application topically as needed.     . desonide (DESOWEN) 0.05 % cream Apply 1 application topically as needed.     . fexofenadine (ALLEGRA) 180 MG tablet Take 1 tablet (180 mg total) by mouth daily. 90 tablet 0  . fluticasone-salmeterol (ADVAIR HFA) 115-21 MCG/ACT inhaler Inhale 2 puffs into the lungs 2 (two) times daily. 1 each 0  . furosemide (LASIX) 20 MG tablet Take 0.5-1 tablets (10-20 mg total) by mouth daily as needed. 30 tablet 0  . ketoconazole (NIZORAL) 2 % shampoo Apply 1 application topically as needed.     . traZODone  (DESYREL) 50 MG tablet Take 1 tablet (50 mg total) by mouth at bedtime as needed for sleep. 90 tablet 0   No current facility-administered medications for this visit.   Allergies  Allergen Reactions  . Calamine Rash  . Pramoxine-Calamine Rash and Other (See Comments)  . Versed [Midazolam]   . Diphenhydramine Hcl Rash     If phone visit, billing and coding can please add appropriate modifier if needed

## 2021-03-20 ENCOUNTER — Encounter: Payer: Self-pay | Admitting: Osteopathic Medicine

## 2021-03-20 ENCOUNTER — Telehealth: Payer: Self-pay

## 2021-03-20 NOTE — Telephone Encounter (Signed)
Tayah with [facility] called wanting to know if patient should hold the Eliquis prior to dental cleaning. Please advise.

## 2021-03-20 NOTE — Telephone Encounter (Signed)
She shouldn't need to hold this for a routine dental cleaning.    CM

## 2021-03-21 ENCOUNTER — Ambulatory Visit (INDEPENDENT_AMBULATORY_CARE_PROVIDER_SITE_OTHER): Payer: BC Managed Care – PPO

## 2021-03-21 ENCOUNTER — Other Ambulatory Visit: Payer: Self-pay

## 2021-03-21 DIAGNOSIS — I824Y1 Acute embolism and thrombosis of unspecified deep veins of right proximal lower extremity: Secondary | ICD-10-CM | POA: Diagnosis not present

## 2021-03-21 DIAGNOSIS — I82811 Embolism and thrombosis of superficial veins of right lower extremities: Secondary | ICD-10-CM | POA: Diagnosis not present

## 2021-03-21 NOTE — Telephone Encounter (Signed)
Office advised of recommendations.

## 2021-03-23 ENCOUNTER — Encounter: Payer: Self-pay | Admitting: Osteopathic Medicine

## 2021-03-23 ENCOUNTER — Other Ambulatory Visit: Payer: Self-pay

## 2021-03-23 ENCOUNTER — Ambulatory Visit: Payer: BC Managed Care – PPO | Admitting: Osteopathic Medicine

## 2021-03-23 VITALS — BP 161/73 | HR 65 | Temp 98.2°F | Wt 279.1 lb

## 2021-03-23 DIAGNOSIS — I82812 Embolism and thrombosis of superficial veins of left lower extremities: Secondary | ICD-10-CM

## 2021-03-23 DIAGNOSIS — U099 Post covid-19 condition, unspecified: Secondary | ICD-10-CM | POA: Diagnosis not present

## 2021-03-23 DIAGNOSIS — I82441 Acute embolism and thrombosis of right tibial vein: Secondary | ICD-10-CM

## 2021-03-23 DIAGNOSIS — R600 Localized edema: Secondary | ICD-10-CM

## 2021-03-23 DIAGNOSIS — M1710 Unilateral primary osteoarthritis, unspecified knee: Secondary | ICD-10-CM

## 2021-03-23 DIAGNOSIS — M179 Osteoarthritis of knee, unspecified: Secondary | ICD-10-CM

## 2021-03-23 NOTE — Progress Notes (Signed)
Morgan Maddox is a 60 y.o. female who presents to  [facility]  today, 03/23/21, seeking care for the following:  . Follow up from 03/15/21 virtual visit - at that time c/o cough w/ scant coughing up blood once, this has not recurred but she does have some persistent chest tightness since COVID infectino . DVT - deep thrombosis resolved but she is nervous given persistent superficial thrombosis. Some LE swelling which she also attirbutes to knee pain.      ASSESSMENT & PLAN with other pertinent findings:  The primary encounter diagnosis was Post covid-19 condition, unspecified. Diagnoses of Acute deep vein thrombosis (DVT) of tibial vein of right lower extremity (HCC), Chronic superficial venous thrombosis of lower extremity, left, Bilateral leg edema, and Osteoarthritis of knee, unspecified laterality, unspecified osteoarthritis type were also pertinent to this visit.  1. Post covid-19 condition, unspecified Pt did not tolerate inhaler(s), defer adjustment for now, work on physical activity as able to increase endurance   2. Acute deep vein thrombosis (DVT) of tibial vein of right lower extremity (HCC) Resolved but pt anxious given COVID history and given superficial thrombosis has persisted. I think not unreasonable to remain on Eliquis a bit longer   3. Chronic superficial venous thrombosis of lower extremity, left Advised warm compresses and consistent use compressino socks   4. Bilateral leg edema Compression socks advised Advised walking as much as possible given knee pain   5. Osteoarthritis of knee, unspecified laterality, unspecified osteoarthritis type Follow w/ orthopedics   Patient Instructions  Will stay on Eliquis for another 3 months (6 months total)  OK to take the Lasix 20 mg once or twice daily  Use compression socks as needed Call orthopedics! Would like you to be walking as much as possible    No  orders of the defined types were placed in this encounter.   No orders of the defined types were placed in this encounter.    See below for relevant physical exam findings  See below for recent lab and imaging results reviewed  Medications, allergies, PMH, PSH, SocH, FamH reviewed below    Follow-up instructions: Return in about 3 months (around 06/23/2021) for recheck clot issues, breathing issues, see [place] sooner if needed! .                                        Exam:  BP (!) 161/73 (BP Location: Left Arm, Patient Position: Sitting, Cuff Size: Large)   Pulse 65   Temp 98.2 F (36.8 C) (Oral)   Wt 279 lb 1.3 oz (126.6 kg)   BMI 43.71 kg/m   Constitutional: VS see above. General Appearance: alert, well-developed, well-nourished, NAD  Neck: No masses, trachea midline.   Respiratory: Normal respiratory effort. no wheeze, no rhonchi, no rales  Cardiovascular: S1/S2 normal, no murmur, no rub/gallop auscultated. RRR. No LE edema.   Musculoskeletal: Gait normal.   Neurological: Normal balance/coordination. No tremor.  Skin: warm, dry, intact.   Psychiatric: Normal judgment/insight. Normal mood and affect. Oriented x3.   Current Meds  Medication Sig  . AMBULATORY NON FORMULARY MEDICATION Medication Name: Compression stockings, please measure to fit, 20-30 mm Hg  . apixaban (ELIQUIS) 5 MG TABS tablet Take 1 tablet (5 mg total) by mouth 2 (two) times daily.  . calcium-vitamin D (OSCAL WITH D) 500-200 MG-UNIT tablet Take  1 tablet by mouth 3 (three) times daily.  . clobetasol (TEMOVATE) 0.05 % external solution Apply 1 application topically as needed.   . desonide (DESOWEN) 0.05 % cream Apply 1 application topically as needed.   . fexofenadine (ALLEGRA) 180 MG tablet Take 1 tablet (180 mg total) by mouth daily.  . furosemide (LASIX) 20 MG tablet Take 0.5-1 tablets (10-20 mg total) by mouth daily as needed.  [name] ketoconazole (NIZORAL) 2 %  shampoo Apply 1 application topically as needed.   . traZODone (DESYREL) 50 MG tablet Take 1 tablet (50 mg total) by mouth at bedtime as needed for sleep.    Allergies  Allergen Reactions  . Calamine Rash  . Pramoxine-Calamine Rash and Other (See Comments)  . Versed [Midazolam]   . Diphenhydramine Hcl Rash    Patient Active Problem List   Diagnosis Date Noted  . Acute deep vein thrombosis (DVT) of tibial vein of right lower extremity (HCC) 01/06/2021  . Tinnitus of right ear 01/02/2021  . Bilateral leg edema 01/02/2021  . Post-traumatic osteoarthritis of left knee 10/05/2020  . Cerebrospinal fluid leak 06/27/2020  . Conductive hearing loss 06/27/2020  . Atypical chest pain 09/05/2019  . Subacute maxillary sinusitis 09/05/2018  . Poor compliance with medication 10/08/2016  . 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 Prediabetes 04/30/2016  . Costochondritis 03/04/2016  . Elevated blood pressure reading 03/23/2015  . Vitamin D deficiency 03/23/2015  . Medication management 03/23/2015  . Morbid obesity (BMI 43.44)  03/23/2015  . History of mononucleosis 04/21/2013  . 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 History of D&C 04/21/2013  . Mixed hyperlipidemia 04/21/2013    Family History  Problem Relation Age of Onset  . Heart failure Mother        Long-standing CAD  . Heart attack Mother        First MI in 56s  . Coronary artery disease Mother   . Diabetes Mother   . Colon polyps Mother   . Coronary artery disease Sister 53  . Colon cancer Neg Hx   . Esophageal cancer Neg Hx     Social History   Tobacco Use  Smoking Status Former Smoker  . Packs/day: 0.50  . Years: 25.00  . Pack years: 12.50  . Types: Cigarettes  Smokeless Tobacco Never Used    Past Surgical History:  Procedure Laterality Date  . MASTOIDECTOMY    . NASAL SINUS SURGERY    . NM MYOVIEW LTD  02/09/2016   [place]: EF 76%. Periapical defect with partial improvement during rest. No wall motion  abnormality noted. Likely related to breast attenuation, cannot exclude small area of periapical ischemia. --> Cardiac catheterization not recommended. LOW RISK  . OVARIAN CYST SURGERY    . TONSILLECTOMY    . Transverse Sinus Stent  07/2017   [facility] - > for IHH/pseudotumor cerebri    Immunization History  Administered Date(s) Administered  . PFIZER(Purple Top)SARS-COV-2 Vaccination 01/29/2020, 02/18/2020  . PPD Test 03/23/2015    Recent Results (from the past 2160 hour(s))  Uric acid     Status: None   Collection Time: 01/02/21 12:00 AM  Result Value Ref Range   Uric Acid, Serum 4.4 2.5 - 7.0 mg/dL    Comment: Therapeutic target for gout patients: <6.0 mg/dL .   COMPLETE METABOLIC PANEL WITH GFR     Status: None   Collection Time: 01/31/21 12:00 AM  Result Value Ref Range   Glucose, Bld 98 65 - 99 mg/dL  Comment: .            Fasting reference interval .    BUN 14 7 - 25 mg/dL   Creat 0.74 0.50 - 1.05 mg/dL    Comment: For patients >19 years of age, the reference limit for Creatinine is approximately 13% higher for people identified as African-American. .    GFR, Est Non African American 89 > OR = 60 mL/min/1.31m2   GFR, Est African American 103 > OR = 60 mL/min/1.30m2   BUN/Creatinine Ratio NOT APPLICABLE 6 - 22 (calc)   Sodium 139 135 - 146 mmol/L   Potassium 4.7 3.5 - 5.3 mmol/L   Chloride 101 98 - 110 mmol/L   CO2 29 20 - 32 mmol/L   Calcium 9.9 8.6 - 10.4 mg/dL   Total Protein 6.9 6.1 - 8.1 g/dL   Albumin 4.5 3.6 - 5.1 g/dL   Globulin 2.4 1.9 - 3.7 g/dL (calc)   AG Ratio 1.9 1.0 - 2.5 (calc)   Total Bilirubin 0.5 0.2 - 1.2 mg/dL   Alkaline phosphatase (APISO) 91 37 - 153 U/L   AST 12 10 - 35 U/L   ALT 13 6 - 29 U/L  Hemoglobin A1c     Status: Abnormal   Collection Time: 01/31/21 12:00 AM  Result Value Ref Range   Hgb A1c MFr Bld 5.8 (H) <5.7 % of total Hgb    Comment: For someone without known diabetes, a hemoglobin  A1c value between 5.7% and 6.4% is  consistent with prediabetes and should be confirmed with a  follow-up test. . For someone with known diabetes, a value <7% indicates that their diabetes is well controlled. A1c targets should be individualized based on duration of diabetes, age, comorbid conditions, and other considerations. . This assay result is consistent with an increased risk of diabetes. . Currently, no consensus exists regarding use of hemoglobin A1c for diagnosis of diabetes for children. .    Mean Plasma Glucose 120 mg/dL   eAG (mmol/L) 6.6 mmol/L  TSH     Status: None   Collection Time: 01/31/21 12:00 AM  Result Value Ref Range   TSH 1.23 0.40 - 4.50 mIU/L  Magnesium     Status: None   Collection Time: 01/31/21 12:00 AM  Result Value Ref Range   Magnesium 2.1 1.5 - 2.5 mg/dL    US Venous Img Lower Bilateral (DVT)  Result Date: 03/21/2021 CLINICAL DATA:  60 year old female with history of right lower extremity deep vein thrombosis. Follow-up study. EXAM: BILATERAL LOWER EXTREMITY VENOUS DOPPLER ULTRASOUND TECHNIQUE: Gray-scale sonography with graded compression, as well as color Doppler and duplex ultrasound were performed to evaluate the lower extremity deep venous systems from the level of the common femoral vein and including the common femoral, femoral, profunda femoral, popliteal and calf veins including the posterior tibial, peroneal and gastrocnemius veins when visible. The superficial great saphenous vein was also interrogated. Spectral Doppler was utilized to evaluate flow at rest and with distal augmentation maneuvers in the common femoral, femoral and popliteal veins. COMPARISON:  None. FINDINGS: RIGHT LOWER EXTREMITY Common Femoral Vein: No evidence of thrombus. Normal compressibility, respiratory phasicity and response to augmentation. Saphenofemoral Junction: No evidence of thrombus. Normal compressibility and flow on color Doppler imaging. Profunda Femoral Vein: No evidence of thrombus. Normal  compressibility and flow on color Doppler imaging. Femoral Vein: No evidence of thrombus. Normal compressibility, respiratory phasicity and response to augmentation. Popliteal Vein: No evidence of thrombus. Normal compressibility, respiratory phasicity and response  to augmentation. Calf Veins: The peroneal vein is poorly visualized. No evidence of thrombus. Normal compressibility and flow on color Doppler imaging. Superficial Great Saphenous Vein: Unchanged noncompressibility of the greater saphenous vein at the level of the ankle. LEFT LOWER EXTREMITY Common Femoral Vein: No evidence of thrombus. Normal compressibility, respiratory phasicity and response to augmentation. Saphenofemoral Junction: No evidence of thrombus. Normal compressibility and flow on color Doppler imaging. Profunda Femoral Vein: No evidence of thrombus. Normal compressibility and flow on color Doppler imaging. Femoral Vein: No evidence of thrombus. Normal compressibility, respiratory phasicity and response to augmentation. Popliteal Vein: No evidence of thrombus. Normal compressibility, respiratory phasicity and response to augmentation. Calf Veins: The peroneal vein is poorly visualized. No evidence of thrombus. Normal compressibility and flow on color Doppler imaging. Superficial Great Saphenous Vein: No evidence of thrombus. Normal compressibility. IMPRESSION: 1. Resolution of previously visualized right lower extremity calf deep vein thrombosis. 2. Chronic thrombosis of the right greater saphenous vein (a superficial vein) at the level of the ankle. 3. No evidence of left lower extremity deep vein thrombosis. Ruthann Cancer, MD [facility] Electronically Signed   By: Ruthann Cancer MD   On: 03/21/2021 15:26       All questions at time of visit were answered - patient instructed to contact office with any additional concerns or updates. ER/RTC precautions were reviewed with the  patient as applicable.   Please note: manual typing as well as voice recognition software may have been used to produce this document - typos may escape review. Please contact Dr. Sheppard Coil for any needed clarifications.

## 2021-03-23 NOTE — Patient Instructions (Signed)
Will stay on Eliquis for another 3 months (6 months total)  OK to take the Lasix 20 mg once or twice daily  Use compression socks as needed Call orthopedics! Would like you to be walking as much as possible

## 2021-03-24 ENCOUNTER — Encounter: Payer: Self-pay | Admitting: Orthopaedic Surgery

## 2021-03-24 ENCOUNTER — Telehealth: Payer: Self-pay

## 2021-03-24 ENCOUNTER — Ambulatory Visit: Payer: BC Managed Care – PPO | Admitting: Orthopaedic Surgery

## 2021-03-24 ENCOUNTER — Ambulatory Visit: Payer: Self-pay

## 2021-03-24 DIAGNOSIS — M1732 Unilateral post-traumatic osteoarthritis, left knee: Secondary | ICD-10-CM | POA: Diagnosis not present

## 2021-03-24 DIAGNOSIS — G8929 Other chronic pain: Secondary | ICD-10-CM

## 2021-03-24 DIAGNOSIS — M25562 Pain in left knee: Secondary | ICD-10-CM

## 2021-03-24 MED ORDER — LIDOCAINE HCL 1 % IJ SOLN
2.0000 mL | INTRAMUSCULAR | Status: AC | PRN
  Administered 2021-03-24: 2 mL

## 2021-03-24 MED ORDER — METHYLPREDNISOLONE ACETATE 40 MG/ML IJ SUSP
40.0000 mg | INTRAMUSCULAR | Status: AC | PRN
  Administered 2021-03-24: 40 mg via INTRA_ARTICULAR

## 2021-03-24 MED ORDER — BUPIVACAINE HCL 0.25 % IJ SOLN
2.0000 mL | INTRAMUSCULAR | Status: AC | PRN
  Administered 2021-03-24: 2 mL via INTRA_ARTICULAR

## 2021-03-24 NOTE — Telephone Encounter (Signed)
Patient called stating that she is a lot of pain with her left knee.  Stated that she got the cortisone injection this morning and could it be that the numbing medicine is wearing off? Stated that she is on blood thinners and that she was going to take 2 Ibuprofen.  Would like a call back to discuss?  Cb# [phone].  Please advise.  Thank you.

## 2021-03-24 NOTE — Progress Notes (Signed)
Office Visit Note   Patient: Morgan Maddox           Date of Birth: 10-29-1961           MRN: [id number] Visit Date: 03/24/2021              Requested by: Emeterio Reeve, [address] [place],  [place] [address] PCP: Emeterio Reeve, DO   Assessment & Plan: Visit Diagnoses:  1. Chronic pain of left knee   2. Post-traumatic osteoarthritis of left knee     Plan: Impression is left knee pain and swelling from underlying osteoarthritis, insufficiency fracture and previous DVT.  I believe the patient's pain and swelling to the left lower extremity and the knee is a combination of the above.  I believe her insufficiency fracture is slowly healing but she is still exhibiting pain to the lateral aspect.  We have discussed wearing compression stockings in addition to knee aspiration and injection as well as weight loss.  She will also continue wearing her lateral unloader brace with activity.  Today, I aspirated approximately 30 cc of blood-tinged fluid from the left knee.  I then injected this with cortisone which she tolerated well.  She will follow-up with [place] as needed.  We have discussed viscosupplementation injection if she fails to get relief from cortisone.  Total face to face encounter time was greater than 25 minutes and over half of this time was spent in counseling and/or coordination of care.  Follow-Up Instructions: Return if symptoms worsen or fail to improve.   Orders:  Orders Placed This Encounter  Procedures  . Large Joint Inj: R knee  . XR KNEE 3 VIEW LEFT   No orders of the defined types were placed in this encounter.     Procedures: Large Joint Inj: R knee on 03/24/2021 10:01 AM Indications: pain Details: 22 G needle, anterolateral approach Medications: 2 mL lidocaine 1 %; 2 mL bupivacaine 0.25 %; 40 mg methylPREDNISolone acetate 40 MG/ML      Clinical Data: No additional findings.   Subjective: Chief Complaint  Patient presents  with  . Left Knee - Pain, Edema    HPI patient is a 60 year old female who comes in today with continued left knee pain and swelling.  Initial pain began about 7 months ago after a lot of heavy walking on a trip.  She was initially seen by a sports medicine doctor where cortisone injection was performed without relief.  She was then seen in our office where subsequent MRI was ordered which showed tricompartmental degenerative changes as well as insufficiency fracture to the lateral tibial plateau.  She was provided a DJD unloader brace which she wore at times.  She then developed multiple bilateral lower extremity DVTs.  She notes that recent ultrasounds of both legs showed that the DVTs have dissolved.  She continues to have pain and swelling to the left lower extremity specifically the knee.  She notes that she is not getting relief wearing the DJD unloader brace.  Review of Systems as detailed in HPI.  All others reviewed and are negative.   Objective: Vital Signs: There were no vitals taken for this visit.  Physical Exam well-developed well-nourished female no acute distress.  Alert and oriented x3.  Ortho Exam left lower extremity exam reveals moderate swelling.  Calf is soft and nontender.  She does have a moderate sized effusion to the left knee.  Range of motion 0 to 95 degrees.  Lateral greater than medial joint line tenderness.  She is neurovascularly intact distally.  Specialty Comments:  No specialty comments available.  Imaging: XR KNEE 3 VIEW LEFT  Result Date: 03/24/2021 Moderate degenerative changes all 3 compartments.  Stable loose body to the posterior knee    PMFS History: Patient Active Problem List   Diagnosis Date Noted  . Acute deep vein thrombosis (DVT) of tibial vein of right lower extremity ([place]) 01/06/2021  . Tinnitus of right ear 01/02/2021  . Bilateral leg edema 01/02/2021  . Post-traumatic osteoarthritis of left knee 10/05/2020  . Cerebrospinal fluid  leak 06/27/2020  . Conductive hearing loss 06/27/2020  . Atypical chest pain 09/05/2019  . Subacute maxillary sinusitis 09/05/2018  . Poor compliance with medication 10/08/2016  . 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 Prediabetes 04/30/2016  . Costochondritis 03/04/2016  . Elevated blood pressure reading 03/23/2015  . Vitamin D deficiency 03/23/2015  . Medication management 03/23/2015  . Morbid obesity (BMI 43.44)  03/23/2015  . History of mononucleosis 04/21/2013  . 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 History of D&C 04/21/2013  . Mixed hyperlipidemia 04/21/2013   Past Medical History:  Diagnosis Date  . Anxiety   . Bilateral ovarian cysts   . Chronic headache   . Family history of premature CAD    Brother had an MI, sister has CAD. Mother had an MI at age 55. Still alive at 47.  [name] History of mononucleosis 04/21/2013  . History of sinus surgery 04/21/2013  . Hypertriglyceridemia 04/21/2013  . Morbid obesity with BMI of 40.0-44.9, adult ([place])    BMI roughly 43 noted on 02/09/2016 admission [name])  . 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 Obesity   . Pseudotumor cerebri 08/2017   Idiopathic intracerebral hypertension -> s/p right transverse sinus stenting  . Vitamin D deficiency     Family History  Problem Relation Age of Onset  . Heart failure Mother        Long-standing CAD  . Heart attack Mother        First MI in 35s  . Coronary artery disease Mother   . Diabetes Mother   . Colon polyps Mother   . Coronary artery disease Sister 57  . Colon cancer Neg Hx   . Esophageal cancer Neg Hx     Past Surgical History:  Procedure Laterality Date  . MASTOIDECTOMY    . NASAL SINUS SURGERY    . NM MYOVIEW LTD  02/09/2016   [place]: EF 76%. Periapical defect with partial improvement during rest. No wall motion abnormality noted. Likely related to breast attenuation, cannot exclude small area of periapical ischemia. --> Cardiac catheterization not recommended. LOW  RISK  . OVARIAN CYST SURGERY    . TONSILLECTOMY    . Transverse Sinus Stent  07/2017   [facility] - > for IHH/pseudotumor cerebri   Social History   Occupational History  . Occupation: [place]    Employer: [employer]  Tobacco Use  . Smoking status: Former Smoker    Packs/day: 0.50    Years: 25.00    Pack years: 12.50    Types: Cigarettes  . Smokeless tobacco: Never Used  Vaping Use  . Vaping Use: Never used  Substance and Sexual Activity  . Alcohol use: Yes    Alcohol/week: 0.0 standard drinks    Comment: social  . Drug use: No  . Sexual activity: Not Currently

## 2021-03-27 NOTE — Telephone Encounter (Signed)
Patient would like a callback from Dr. Erlinda Hong.  Cannot bend knee. Knee is more swollen after inj/asp.  She is very frustrated. States she's very concerned about swelling. She does not want any pills.   Also she is mad at the fact noone called her back Friday. I did advise her it does take 24-48 hours sometimes. I did also advise Dr Xu/Lindsey were in [place] Friday PM and are in [place] all day Monday.   Please call patient.

## 2021-03-27 NOTE — Telephone Encounter (Signed)
Could be that the numbing part has worn off like she suggested.  I would not take nsaids if on blood thinners.  I am happy to call in tylenol 3 if she would like

## 2021-03-28 NOTE — Telephone Encounter (Signed)
Spoke to her and answered questions

## 2021-03-30 ENCOUNTER — Other Ambulatory Visit: Payer: Self-pay

## 2021-03-30 ENCOUNTER — Other Ambulatory Visit: Payer: Self-pay | Admitting: Physician Assistant

## 2021-03-30 ENCOUNTER — Encounter: Payer: Self-pay | Admitting: Osteopathic Medicine

## 2021-03-30 DIAGNOSIS — I82441 Acute embolism and thrombosis of right tibial vein: Secondary | ICD-10-CM

## 2021-03-30 MED ORDER — ELIQUIS 5 MG PO TABS: 5.0000 mg | ORAL_TABLET | Freq: Two times a day (BID) | ORAL | 0 refills | Status: DC

## 2021-04-04 ENCOUNTER — Ambulatory Visit: Payer: BC Managed Care – PPO | Admitting: Orthopaedic Surgery

## 2021-06-08 ENCOUNTER — Encounter: Payer: Self-pay | Admitting: Osteopathic Medicine

## 2021-06-08 ENCOUNTER — Other Ambulatory Visit: Payer: Self-pay

## 2021-06-08 ENCOUNTER — Ambulatory Visit (INDEPENDENT_AMBULATORY_CARE_PROVIDER_SITE_OTHER): Payer: BC Managed Care – PPO | Admitting: Osteopathic Medicine

## 2021-06-08 VITALS — BP 153/89 | HR 76 | Temp 98.2°F | Ht 67.0 in | Wt 276.0 lb

## 2021-06-08 DIAGNOSIS — N3091 Cystitis, unspecified with hematuria: Secondary | ICD-10-CM

## 2021-06-08 DIAGNOSIS — Z86718 Personal history of other venous thrombosis and embolism: Secondary | ICD-10-CM | POA: Diagnosis not present

## 2021-06-08 DIAGNOSIS — R300 Dysuria: Secondary | ICD-10-CM

## 2021-06-08 LAB — POCT URINALYSIS DIP (CLINITEK)
Bilirubin, UA: NEGATIVE
Glucose, UA: NEGATIVE mg/dL
Ketones, POC UA: NEGATIVE mg/dL
Leukocytes, UA: NEGATIVE
Nitrite, UA: NEGATIVE
Spec Grav, UA: >=1.03 — AB (ref 1.010–1.025)
Urobilinogen, UA: 0.2 E.U./dL
pH, UA: 6 (ref 5.0–8.0)

## 2021-06-08 MED ORDER — SULFAMETHOXAZOLE-TRIMETHOPRIM 800-160 MG PO TABS: 1.0000 | ORAL_TABLET | Freq: Two times a day (BID) | ORAL | 0 refills | Status: AC

## 2021-06-08 NOTE — Progress Notes (Signed)
Morgan Maddox is a 60 y.o. female who presents to  [facility] [address] [facility]  today, 06/12/21, seeking care for the following:  UTI - recently prescribed Macrobid via e-vist ut did not complete course for this d/t nausea. Last dose was 3-4 days ago Eliquis - concerned for bleeding gums, nose bleed.  Overall just feeling a bit "off" and attributes this to the medication.  Given significant anxiety over DVT, we, in shared decision-making, opted to extend her course of anticoagulation a few months ago, so she is definitely past the window of required anticoagulation treatment Has some questions about calcium/vitamin D, see below Concern for weight, having some difficulty with weight loss, history of intolerance to antiobesity medications, concerned about impact on knee pain     ASSESSMENT & PLAN with other pertinent findings:  The primary encounter diagnosis was Dysuria. Diagnoses of Cystitis with hematuria and History of DVT (deep vein thrombosis) were also pertinent to this visit.   Sent treatment for presumed UTI Can discontinue anticoagulation for DVT Discussed that with elevated blood pressure, I am not comfortable prescribing stimulant for appetite suppression, patient will check list noted in patient instructions against what ever her insurance covers then we may consider initiating antiobesity medications versus referral to healthy weight/wellness or bariatrics  Patient Instructions  Calcium 1200-1500 mg daily Vitamin D 2000-5000 units daily   Can STOP Eliquis RESTART Aspirin 81 mg daily CHANGE antibiotics to Bactrim CONSIDER list below for weight loss  Medications approved for long-term use for obesity Qsymia (Phentermine + Topiramate) - may help migraines/headaches Saxenda (Liraglutide daily injections) - will also help diabetes/prediabetes Wegovy (Semaglutide weekly injections) - will also help diabetes/prediabetes Contrave  (Bupropion + Naltrexone) - may help mental health/depression  Orlistat (Xenical Rx, Alli OTC) - not my favorite, can cause diarrhea Bupropion (Wellbutrin) - generic antidepressant, will also help mental health, may help with quitting smoking  Plenity (Cellulose and citric acid hydrogel) - take before meal times w/ water and expands to occupy volume in the stomach and small intestine to create a sensation of fullness and increase satiety. Insurance probably won't cover this.  I recommend that you research the above medications and see which one(s) your insurance may or may not cover: If you call your insurance, ask them specifically what medications are on their formulary that are approved for obesity treatment. They should be able to send you a list or tell you over the phone. Remember, medications aren't magic! You MUST be diligent about lifestyle changes as well!   Orders Placed This Encounter  Procedures   Urine Culture   POCT URINALYSIS DIP (CLINITEK)    Meds ordered this encounter  Medications   sulfamethoxazole-trimethoprim (BACTRIM DS) 800-160 MG tablet    Sig: Take 1 tablet by mouth 2 (two) times daily for 5 days.    Dispense:  10 tablet    Refill:  0   DISCONTD: cephALEXin (KEFLEX) 500 MG capsule    Sig: Take 1 capsule (500 mg total) by mouth 3 (three) times daily.    Dispense:  5 capsule    Refill:  0     See below for relevant physical exam findings  See below for recent lab and imaging results reviewed  Medications, allergies, PMH, PSH, SocH, FamH reviewed below    Follow-up instructions: Return in about 2 weeks (around 06/22/2021) for MONITOR BLOOD PRESSURE, REVISIT WEIGHT LOSS MEDICATION (OV40).  Exam:  BP (!) 153/89   Pulse 76   Temp 98.2 F (36.8 C)   Ht '5\' 7"'$  (1.702 m)   Wt 276 lb (125.2 kg)   SpO2 99%   BMI 43.23 kg/m  Constitutional: VS see above. General Appearance: alert,  well-developed, well-nourished, NAD Neck: No masses, trachea midline.  Respiratory: Normal respiratory effort. no wheeze, no rhonchi, no rales Cardiovascular: S1/S2 normal, no murmur, no rub/gallop auscultated. RRR.  Musculoskeletal: Gait normal. Symmetric and independent movement of all extremities Neurological: Normal balance/coordination. No tremor. Skin: warm, dry, intact.  Psychiatric: Normal judgment/insight. Normal mood and affect. Oriented x3.   Current Meds  Medication Sig   AMBULATORY NON FORMULARY MEDICATION Medication Name: Compression stockings, please measure to fit, 20-30 mm Hg   calcium-vitamin D (OSCAL WITH D) 500-200 MG-UNIT tablet Take 1 tablet by mouth 3 (three) times daily.   clobetasol (TEMOVATE) 0.05 % external solution Apply 1 application topically as needed.    desonide (DESOWEN) 0.05 % cream Apply 1 application topically as needed.    fexofenadine (ALLEGRA) 180 MG tablet Take 1 tablet (180 mg total) by mouth daily.   furosemide (LASIX) 20 MG tablet Take 0.5-1 tablets (10-20 mg total) by mouth daily as needed.   ketoconazole (NIZORAL) 2 % shampoo Apply 1 application topically as needed.    sulfamethoxazole-trimethoprim (BACTRIM DS) 800-160 MG tablet Take 1 tablet by mouth 2 (two) times daily for 5 days.   traZODone (DESYREL) 50 MG tablet Take 1 tablet (50 mg total) by mouth at bedtime as needed for sleep.   [DISCONTINUED] apixaban (ELIQUIS) 5 MG TABS tablet Take 1 tablet (5 mg total) by mouth 2 (two) times daily.   [DISCONTINUED] cephALEXin (KEFLEX) 500 MG capsule Take 1 capsule (500 mg total) by mouth 3 (three) times daily.    Allergies  Allergen Reactions   Calamine Rash   Pramoxine-Calamine Rash and Other (See Comments)   Macrobid [Nitrofurantoin Macrocrystal] Nausea Only   Versed [Midazolam]    Diphenhydramine Hcl Rash    Patient Active Problem List   Diagnosis Date Noted   Acute deep vein thrombosis (DVT) of tibial vein of right lower extremity ([place])  01/06/2021   Tinnitus of right ear 01/02/2021   Bilateral leg edema 01/02/2021   Post-traumatic osteoarthritis of left knee 10/05/2020   Cerebrospinal fluid leak 06/27/2020   Conductive hearing loss 06/27/2020   Atypical chest pain 09/05/2019   Subacute maxillary sinusitis 09/05/2018   Poor compliance with medication 10/08/2016   Panic type anxiety neurosis 07/03/2016   Prediabetes 04/30/2016   Costochondritis 03/04/2016   Elevated blood pressure reading 03/23/2015   Vitamin D deficiency 03/23/2015   Medication management 03/23/2015   Morbid obesity (BMI 43.44)  03/23/2015   History of mononucleosis 04/21/2013   Mucocele of nasal sinus 04/21/2013   History of D&C 04/21/2013   Mixed hyperlipidemia 04/21/2013    Family History  Problem Relation Age of Onset   Heart failure Mother        Long-standing CAD   Heart attack Mother        First MI in 31s   Coronary artery disease Mother    Diabetes Mother    Colon polyps Mother    Coronary artery disease Sister 6   Colon cancer Neg Hx    Esophageal cancer Neg Hx     Social History   Tobacco Use  Smoking Status Former   Packs/day: 0.50   Years: 25.00   Pack years: 12.50   Types: Cigarettes  Smokeless Tobacco Never    Past Surgical History:  Procedure Laterality Date   MASTOIDECTOMY     NASAL SINUS SURGERY     NM MYOVIEW LTD  02/09/2016   [place]: EF 76%. Periapical defect with partial improvement during rest. No wall motion abnormality noted. Likely related to breast attenuation, cannot exclude small area of periapical ischemia. --> Cardiac catheterization not recommended. LOW RISK   OVARIAN CYST SURGERY     TONSILLECTOMY     Transverse Sinus Stent  07/2017   [facility] - > for IHH/pseudotumor cerebri    Immunization History  Administered Date(s) Administered   PFIZER(Purple Top)SARS-COV-2 Vaccination 01/29/2020, 02/18/2020   PPD Test 03/23/2015    Recent Results (from the past 2160 hour(s))  POCT URINALYSIS  DIP (CLINITEK)     Status: Abnormal   Collection Time: 06/08/21  3:35 PM  Result Value Ref Range   Color, UA yellow yellow   Clarity, UA clear clear   Glucose, UA negative negative mg/dL   Bilirubin, UA negative negative   Ketones, POC UA negative negative mg/dL   Spec Grav, UA >=1.030 (A) 1.010 - 1.025   Blood, UA small (A) negative   pH, UA 6.0 5.0 - 8.0   POC PROTEIN,UA trace negative, trace   Urobilinogen, UA 0.2 0.2 or 1.0 E.U./dL   Nitrite, UA Negative Negative   Leukocytes, UA Negative Negative  Urine Culture     Status: Abnormal   Collection Time: 06/08/21  3:36 PM   Specimen: Urine  Result Value Ref Range   MICRO NUMBER: [id number]    SPECIMEN QUALITY: Adequate    Sample Source NOT GIVEN    STATUS: FINAL    ISOLATE 1: Escherichia coli (A)     Comment: Greater than 100,000 CFU/mL of Escherichia coli      Susceptibility   Escherichia coli - URINE CULTURE, REFLEX    AMOX/CLAVULANIC 4 Sensitive     AMPICILLIN 8 Sensitive     AMPICILLIN/SULBACTAM 4 Sensitive     CEFAZOLIN* <=4 Not Reportable      * For infections other than uncomplicated UTI caused by E. coli, K. pneumoniae or P. mirabilis: Cefazolin is resistant if MIC > or = 8 mcg/mL. (Distinguishing susceptible versus intermediate for isolates with MIC < or = 4 mcg/mL requires additional testing.) For uncomplicated UTI caused by E. coli, K. pneumoniae or P. mirabilis: Cefazolin is susceptible if MIC <32 mcg/mL and predicts susceptible to the oral agents cefaclor, cefdinir, cefpodoxime, cefprozil, cefuroxime, cephalexin and loracarbef.     CEFTAZIDIME <=1 Sensitive     CEFEPIME <=1 Sensitive     CEFTRIAXONE <=1 Sensitive     CIPROFLOXACIN <=0.25 Sensitive     LEVOFLOXACIN <=0.12 Sensitive     GENTAMICIN <=1 Sensitive     IMIPENEM <=0.25 Sensitive     NITROFURANTOIN <=16 Sensitive     PIP/TAZO <=4 Sensitive     TOBRAMYCIN <=1 Sensitive     TRIMETH/SULFA* <=20 Sensitive      * For infections other than  uncomplicated UTI caused by E. coli, K. pneumoniae or P. mirabilis: Cefazolin is resistant if MIC > or = 8 mcg/mL. (Distinguishing susceptible versus intermediate for isolates with MIC < or = 4 mcg/mL requires additional testing.) For uncomplicated UTI caused by E. coli, K. pneumoniae or P. mirabilis: Cefazolin is susceptible if MIC <32 mcg/mL and predicts susceptible to the oral agents cefaclor, cefdinir, cefpodoxime, cefprozil, cefuroxime, cephalexin and loracarbef. Legend: S = Susceptible  I = Intermediate R = Resistant  NS = Not susceptible * = Not tested  NR = Not reported **NN = See antimicrobic comments     No results found.     All questions at time of visit were answered - patient instructed to contact office with any additional concerns or updates. ER/RTC precautions were reviewed with the patient as applicable.   Please note: manual typing as well as voice recognition software may have been used to produce this document - typos may escape review. Please contact Dr. Sheppard Coil for any needed clarifications.   Total encounter time on date of service, 06/12/21, was 40 minutes spent addressing problems/issues as noted above in [place], including time spent in discussion with patient regarding the HPI, ROS, confirming history, reviewing Assessment & Plan, as well as time spent on coordination of care, record review.

## 2021-06-08 NOTE — Patient Instructions (Addendum)
Calcium 1200-1500 mg daily Vitamin D 2000-5000 units daily   Can STOP Eliquis RESTART Aspirin 81 mg daily CHANGE antibiotics to Bactrim CONSIDER list below for weight loss  Medications approved for long-term use for obesity Qsymia (Phentermine + Topiramate) - may help migraines/headaches Saxenda (Liraglutide daily injections) - will also help diabetes/prediabetes Wegovy (Semaglutide weekly injections) - will also help diabetes/prediabetes Contrave (Bupropion + Naltrexone) - may help mental health/depression  Orlistat (Xenical Rx, Alli OTC) - not my favorite, can cause diarrhea Bupropion (Wellbutrin) - generic antidepressant, will also help mental health, may help with quitting smoking  Plenity (Cellulose and citric acid hydrogel) - take before meal times w/ water and expands to occupy volume in the stomach and small intestine to create a sensation of fullness and increase satiety. Insurance probably won't cover this.  I recommend that you research the above medications and see which one(s) your insurance may or may not cover: If you call your insurance, ask them specifically what medications are on their formulary that are approved for obesity treatment. They should be able to send you a list or tell you over the phone. Remember, medications aren't magic! You MUST be diligent about lifestyle changes as well!

## 2021-06-10 LAB — URINE CULTURE
MICRO NUMBER:: 12176688
SPECIMEN QUALITY:: ADEQUATE

## 2021-06-12 ENCOUNTER — Other Ambulatory Visit: Payer: Self-pay | Admitting: *Deleted

## 2021-06-12 MED ORDER — CEPHALEXIN 500 MG PO CAPS: 500.0000 mg | ORAL_CAPSULE | Freq: Three times a day (TID) | ORAL | 0 refills | Status: DC

## 2021-06-26 ENCOUNTER — Ambulatory Visit: Payer: BC Managed Care – PPO | Admitting: Osteopathic Medicine

## 2021-06-26 ENCOUNTER — Encounter: Payer: Self-pay | Admitting: Osteopathic Medicine

## 2021-06-26 ENCOUNTER — Other Ambulatory Visit: Payer: Self-pay

## 2021-06-26 DIAGNOSIS — B9620 Unspecified Escherichia coli [E. coli] as the cause of diseases classified elsewhere: Secondary | ICD-10-CM

## 2021-06-26 DIAGNOSIS — N390 Urinary tract infection, site not specified: Secondary | ICD-10-CM

## 2021-06-26 DIAGNOSIS — R030 Elevated blood-pressure reading, without diagnosis of hypertension: Secondary | ICD-10-CM

## 2021-06-26 NOTE — Patient Instructions (Signed)
How to Take Your Blood Pressure ?Blood pressure measures how strongly your blood is pressing against the walls of your arteries. Arteries are blood vessels that carry blood from your heart throughout your body. You can take your blood pressure at home with a machine. ?You may need to check your blood pressure at home: ?To check if you have high blood pressure (hypertension). ?To check your blood pressure over time. ?To make sure your blood pressure medicine is working. ?Supplies needed: ?Blood pressure machine, or monitor. ?Dining room chair to sit in. ?Table or desk. ?Small notebook. ?Pencil or pen. ?How to prepare ?Avoid these things for 30 minutes before checking your blood pressure: ?Having drinks with caffeine in them, such as coffee or tea. ?Drinking alcohol. ?Eating. ?Smoking. ?Exercising. ?Do these things five minutes before checking your blood pressure: ?Go to the bathroom and pee (urinate). ?Sit in a dining chair. Do not sit on a soft couch or an armchair. ?Be quiet. Do not talk. ?How to take your blood pressure ?Follow the instructions that came with your machine. If you have a digital blood pressure monitor, these may be the instructions: ?Sit up straight. ?Place your feet on the floor. Do not cross your ankles or legs. ?Rest your left arm at the level of your heart. You may rest it on a table, desk, or chair. ?Pull up your shirt sleeve. ?Wrap the blood pressure cuff around the upper part of your left arm. The cuff should be 1 inch (2.5 cm) above your elbow. It is best to wrap the cuff around bare skin. ?Fit the cuff snugly around your arm. You should be able to place only one finger between the cuff and your arm. ?Place the cord so that it rests in the bend of your elbow. ?Press the power button. ?Sit quietly while the cuff fills with air and loses air. ?Write down the numbers on the screen. ?Wait 2-3 minutes and then repeat steps 1-10. ?What do the numbers mean? ?Two numbers make up your blood  pressure. The first number is called systolic pressure. The second is called diastolic pressure. An example of a blood pressure reading is "120 over 80" (or 120/80). ?If you are an adult and do not have a medical condition, use this guide to find out if your blood pressure is normal: ?Normal ?First number: below 120. ?Second number: below 80. ?Elevated ?First number: 120-129. ?Second number: below 80. ?Hypertension stage 1 ?First number: 130-139. ?Second number: 80-89. ?Hypertension stage 2 ?First number: 140 or above. ?Second number: 90 or above. ?Your blood pressure is above normal even if only the first or only the second number is above normal. ?Follow these instructions at home: ?Medicines ?Take over-the-counter and prescription medicines only as told by your doctor. ?Tell your doctor if your medicine is causing side effects. ?General instructions ?Check your blood pressure as often as your doctor tells you to. ?Check your blood pressure at the same time every day. ?Take your monitor to your next doctor's appointment. Your doctor will: ?Make sure you are using it correctly. ?Make sure it is working right. ?Understand what your blood pressure numbers should be. ?Keep all follow-up visits as told by your doctor. This is important. ?General tips ?You will need a blood pressure machine, or monitor. Your doctor can suggest a monitor. You can buy one at a drugstore or online. When choosing one: ?Choose one with an arm cuff. ?Choose one that wraps around your upper arm. Only one finger should fit between   your arm and the cuff. ?Do not choose one that measures your blood pressure from your wrist or finger. ?Where to find more information ?American Heart Association: www.heart.org ?Contact a doctor if: ?Your blood pressure keeps being high. ?Your blood pressure is suddenly low. ?Get help right away if: ?Your first blood pressure number is higher than 180. ?Your second blood pressure number is higher than  120. ?Summary ?Check your blood pressure at the same time every day. ?Avoid caffeine, alcohol, smoking, and exercise for 30 minutes before checking your blood pressure. ?Make sure you understand what your blood pressure numbers should be. ?This information is not intended to replace advice given to you by your health care provider. Make sure you discuss any questions you have with your health care provider. ?Document Revised: 09/07/2020 Document Reviewed: 10/23/2019 ?Elsevier Patient Education ? 2022 [employer] ? ?

## 2021-06-26 NOTE — Progress Notes (Signed)
Morgan Maddox is a 60 y.o. female who presents to  [place] at [facility]  today, 06/26/21, seeking care for the following:  Follow up for BP check Follow up to discuss weight management Follow up UTI     ASSESSMENT & PLAN with other pertinent findings:  The primary encounter diagnosis was Morbid obesity (BMI 42.6). Diagnoses of E. coli urinary tract infection and Elevated blood pressure reading were also pertinent to this visit.   Body mass index is 42.61 kg/m.  Home BP average for 2 weeks: 142/84 Seems to be going up and down  BP Readings from Last 3 Encounters:  06/26/21 139/81  06/08/21 (!) 153/89  03/23/21 (!) 161/73   Wt Readings from Last 3 Encounters:  06/26/21 272 lb 1.3 oz (123.4 kg)  06/08/21 276 lb (125.2 kg)  03/23/21 279 lb 1.3 oz (126.6 kg)    Pt would like to defer BP and weight loss meds Has been doing well w/ diet/exercise over the past 2 eweks Would like to see me again in another 2 weeks to track progress    Patient Instructions  How to Take Your Blood Pressure Blood pressure measures how strongly your blood is pressing against the walls of your arteries. Arteries are blood vessels that carry blood from your heartthroughout your body. You can take your blood pressure at home with a machine. You may need to check your blood pressure at home: To check if you have high blood pressure (hypertension). To check your blood pressure over time. To make sure your blood pressure medicine is working. Supplies needed: Blood pressure machine, or monitor. Dining room chair to sit in. Table or desk. Small notebook. Pencil or pen. How to prepare Avoid these things for 30 minutes before checking your blood pressure: Having drinks with caffeine in them, such as coffee or tea. Drinking alcohol. Eating. Smoking. Exercising. Do these things five minutes before checking your blood pressure: Go to the bathroom and  pee (urinate). Sit in a dining chair. Do not sit on a soft couch or an armchair. Be quiet. Do not talk. How to take your blood pressure Follow the instructions that came with your machine. If you have a digital blood pressure monitor, these may be the instructions: Sit up straight. Place your feet on the floor. Do not cross your ankles or legs. Rest your left arm at the level of your heart. You may rest it on a table, desk, or chair. Pull up your shirt sleeve. Wrap the blood pressure cuff around the upper part of your left arm. The cuff should be 1 inch (2.5 cm) above your elbow. It is best to wrap the cuff around bare skin. Fit the cuff snugly around your arm. You should be able to place only one finger between the cuff and your arm. Place the cord so that it rests in the bend of your elbow. Press the power button. Sit quietly while the cuff fills with air and loses air. Write down the numbers on the screen. Wait 2-3 minutes and then repeat steps 1-10. What do the numbers mean? Two numbers make up your blood pressure. The first number is called systolic pressure. The second is called diastolic pressure. An example of a bloodpressure reading is "120 over 80" (or 120/80). If you are an adult and do not have a medical condition, use this guide to findout if your blood pressure is normal: Normal First number: below 120. Second number:  below 80. Elevated First number: 120-129. Second number: below 80. Hypertension stage 1 First number: 130-139. Second number: 80-89. Hypertension stage 2 First number: 140 or above. Second number: 32 or above. Your blood pressure is above normal even if only the first or only the secondnumber is above normal. Follow these instructions at home: Medicines Take over-the-counter and prescription medicines only as told by your doctor. Tell your doctor if your medicine is causing side effects. General instructions Check your blood pressure as often as your  doctor tells you to. Check your blood pressure at the same time every day. Take your monitor to your next doctor's appointment. Your doctor will: Make sure you are using it correctly. Make sure it is working right. Understand what your blood pressure numbers should be. Keep all follow-up visits as told by your doctor. This is important. General tips You will need a blood pressure machine, or monitor. Your doctor can suggest a monitor. You can buy one at a drugstore or online. When choosing one: Choose one with an arm cuff. Choose one that wraps around your upper arm. Only one finger should fit between your arm and the cuff. Do not choose one that measures your blood pressure from your wrist or finger. Where to find more information American Heart Association: www.heart.org Contact a doctor if: Your blood pressure keeps being high. Your blood pressure is suddenly low. Get help right away if: Your first blood pressure number is higher than 180. Your second blood pressure number is higher than 120. Summary Check your blood pressure at the same time every day. Avoid caffeine, alcohol, smoking, and exercise for 30 minutes before checking your blood pressure. Make sure you understand what your blood pressure numbers should be. This information is not intended to replace advice given to you by your health care provider. Make sure you discuss any questions you have with your healthcare provider. Document Revised: 09/07/2020 Document Reviewed: 10/23/2019 Elsevier Patient Education  2022 [employer].  Orders Placed This Encounter  Procedures   Urine Culture   Urinalysis, Routine w reflex microscopic    No orders of the defined types were placed in this encounter.    See below for relevant physical exam findings  See below for recent lab and imaging results reviewed  Medications, allergies, PMH, PSH, SocH, [place] reviewed below    Follow-up instructions: Return in about 2 weeks  (around 07/10/2021) for MONITOR BLOOD PRESSURE AND WEIGHT .                                        Exam:  BP 139/81 (BP Location: Left Arm, Patient Position: Sitting, Cuff Size: Large)   Pulse 70   Temp 98.3 F (36.8 C) (Oral)   Wt 272 lb 1.3 oz (123.4 kg)   BMI 42.61 kg/m  Constitutional: VS see above. General Appearance: alert, well-developed, well-nourished, NAD Neck: No masses, trachea midline.  Respiratory: Normal respiratory effort.  Musculoskeletal: Gait normal. Symmetric and independent movement of all extremities Neurological: Normal balance/coordination. No tremor. Skin: warm, dry, intact.  Psychiatric: Normal judgment/insight. Normal mood and affect. Oriented x3.   Current Meds  Medication Sig   AMBULATORY NON FORMULARY MEDICATION Medication Name: Compression stockings, please measure to fit, 20-30 mm Hg   calcium-vitamin D (OSCAL WITH D) 500-200 MG-UNIT tablet Take 1 tablet by mouth 3 (three) times daily.   clobetasol (TEMOVATE) 0.05 % external  solution Apply 1 application topically as needed.    desonide (DESOWEN) 0.05 % cream Apply 1 application topically as needed.    fexofenadine (ALLEGRA) 180 MG tablet Take 1 tablet (180 mg total) by mouth daily.   furosemide (LASIX) 20 MG tablet Take 0.5-1 tablets (10-20 mg total) by mouth daily as needed.   ketoconazole (NIZORAL) 2 % shampoo Apply 1 application topically as needed.    traZODone (DESYREL) 50 MG tablet Take 1 tablet (50 mg total) by mouth at bedtime as needed for sleep.    Allergies  Allergen Reactions   Calamine Rash   Pramoxine-Calamine Rash and Other (See Comments)   Macrobid [Nitrofurantoin Macrocrystal] Nausea Only   Versed [Midazolam]    Diphenhydramine Hcl Rash    Patient Active Problem List   Diagnosis Date Noted   Acute deep vein thrombosis (DVT) of tibial vein of right lower extremity ([place]) 01/06/2021   Tinnitus of right ear 01/02/2021   Bilateral leg edema  01/02/2021   Post-traumatic osteoarthritis of left knee 10/05/2020   Cerebrospinal fluid leak 06/27/2020   Conductive hearing loss 06/27/2020   Atypical chest pain 09/05/2019   Subacute maxillary sinusitis 09/05/2018   Poor compliance with medication 10/08/2016   Panic type anxiety neurosis 07/03/2016   Prediabetes 04/30/2016   Costochondritis 03/04/2016   Elevated blood pressure reading 03/23/2015   Vitamin D deficiency 03/23/2015   Medication management 03/23/2015   Morbid obesity (BMI 43.44)  03/23/2015   History of mononucleosis 04/21/2013   Mucocele of nasal sinus 04/21/2013   History of D&C 04/21/2013   Mixed hyperlipidemia 04/21/2013    Family History  Problem Relation Age of Onset   Heart failure Mother        Long-standing CAD   Heart attack Mother        First MI in 31s   Coronary artery disease Mother    Diabetes Mother    Colon polyps Mother    Coronary artery disease Sister 82   Colon cancer Neg Hx    Esophageal cancer Neg Hx     Social History   Tobacco Use  Smoking Status Former   Packs/day: 0.50   Years: 25.00   Pack years: 12.50   Types: Cigarettes  Smokeless Tobacco Never    Past Surgical History:  Procedure Laterality Date   MASTOIDECTOMY     NASAL SINUS SURGERY     NM MYOVIEW LTD  02/09/2016   [place]: EF 76%. Periapical defect with partial improvement during rest. No wall motion abnormality noted. Likely related to breast attenuation, cannot exclude small area of periapical ischemia. --> Cardiac catheterization not recommended. LOW RISK   OVARIAN CYST SURGERY     TONSILLECTOMY     Transverse Sinus Stent  07/2017   [facility] - > for IHH/pseudotumor cerebri    Immunization History  Administered Date(s) Administered   PFIZER(Purple Top)SARS-COV-2 Vaccination 01/29/2020, 02/18/2020   PPD Test 03/23/2015    Recent Results (from the past 2160 hour(s))  POCT URINALYSIS DIP (CLINITEK)     Status: Abnormal   Collection Time: 06/08/21  3:35  PM  Result Value Ref Range   Color, UA yellow yellow   Clarity, UA clear clear   Glucose, UA negative negative mg/dL   Bilirubin, UA negative negative   Ketones, POC UA negative negative mg/dL   Spec Grav, UA >=1.030 (A) 1.010 - 1.025   Blood, UA small (A) negative   pH, UA 6.0 5.0 - 8.0   POC PROTEIN,UA trace negative, trace  Urobilinogen, UA 0.2 0.2 or 1.0 E.U./dL   Nitrite, UA Negative Negative   Leukocytes, UA Negative Negative  Urine Culture     Status: Abnormal   Collection Time: 06/08/21  3:36 PM   Specimen: Urine  Result Value Ref Range   MICRO NUMBER: [id number]    SPECIMEN QUALITY: Adequate    Sample Source NOT GIVEN    STATUS: FINAL    ISOLATE 1: Escherichia coli (A)     Comment: Greater than 100,000 CFU/mL of Escherichia coli      Susceptibility   Escherichia coli - URINE CULTURE, REFLEX    AMOX/CLAVULANIC 4 Sensitive     AMPICILLIN 8 Sensitive     AMPICILLIN/SULBACTAM 4 Sensitive     CEFAZOLIN* <=4 Not Reportable      * For infections other than uncomplicated UTI caused by E. coli, K. pneumoniae or P. mirabilis: Cefazolin is resistant if MIC > or = 8 mcg/mL. (Distinguishing susceptible versus intermediate for isolates with MIC < or = 4 mcg/mL requires additional testing.) For uncomplicated UTI caused by E. coli, K. pneumoniae or P. mirabilis: Cefazolin is susceptible if MIC <32 mcg/mL and predicts susceptible to the oral agents cefaclor, cefdinir, cefpodoxime, cefprozil, cefuroxime, cephalexin and loracarbef.     CEFTAZIDIME <=1 Sensitive     CEFEPIME <=1 Sensitive     CEFTRIAXONE <=1 Sensitive     CIPROFLOXACIN <=0.25 Sensitive     LEVOFLOXACIN <=0.12 Sensitive     GENTAMICIN <=1 Sensitive     IMIPENEM <=0.25 Sensitive     NITROFURANTOIN <=16 Sensitive     PIP/TAZO <=4 Sensitive     TOBRAMYCIN <=1 Sensitive     TRIMETH/SULFA* <=20 Sensitive      * For infections other than uncomplicated UTI caused by E. coli, K. pneumoniae or P.  mirabilis: Cefazolin is resistant if MIC > or = 8 mcg/mL. (Distinguishing susceptible versus intermediate for isolates with MIC < or = 4 mcg/mL requires additional testing.) For uncomplicated UTI caused by E. coli, K. pneumoniae or P. mirabilis: Cefazolin is susceptible if MIC <32 mcg/mL and predicts susceptible to the oral agents cefaclor, cefdinir, cefpodoxime, cefprozil, cefuroxime, cephalexin and loracarbef. Legend: S = Susceptible  I = Intermediate R = Resistant  NS = Not susceptible * = Not tested  NR = Not reported **NN = See antimicrobic comments     No results found.     All questions at time of visit were answered - patient instructed to contact office with any additional concerns or updates. ER/RTC precautions were reviewed with the patient as applicable.   Please note: manual typing as well as voice recognition software may have been used to produce this document - typos may escape review. Please contact Dr. Sheppard Coil for any needed clarifications.   Total encounter time on date of service, 06/26/21, was 30 minutes spent addressing problems/issues as noted above in [place], including time spent in discussion with patient regarding the HPI, ROS, confirming history, reviewing Assessment & Plan, as well as time spent on coordination of care, record review.

## 2021-06-28 ENCOUNTER — Ambulatory Visit: Payer: BC Managed Care – PPO | Admitting: Osteopathic Medicine

## 2021-06-28 LAB — URINALYSIS, ROUTINE W REFLEX MICROSCOPIC
Bilirubin Urine: NEGATIVE
Glucose, UA: NEGATIVE
Hgb urine dipstick: NEGATIVE
Ketones, ur: NEGATIVE
Leukocytes,Ua: NEGATIVE
Nitrite: NEGATIVE
Protein, ur: NEGATIVE
Specific Gravity, Urine: 1.019 (ref 1.001–1.035)
pH: 6 (ref 5.0–8.0)

## 2021-06-28 LAB — URINE CULTURE
MICRO NUMBER:: 12246386
Result:: NO GROWTH
SPECIMEN QUALITY:: ADEQUATE

## 2021-07-11 ENCOUNTER — Ambulatory Visit: Payer: BC Managed Care – PPO | Admitting: Osteopathic Medicine

## 2021-07-18 ENCOUNTER — Ambulatory Visit (INDEPENDENT_AMBULATORY_CARE_PROVIDER_SITE_OTHER): Payer: BC Managed Care – PPO | Admitting: Osteopathic Medicine

## 2021-07-18 ENCOUNTER — Encounter: Payer: Self-pay | Admitting: Osteopathic Medicine

## 2021-07-18 ENCOUNTER — Other Ambulatory Visit: Payer: Self-pay

## 2021-07-18 DIAGNOSIS — R030 Elevated blood-pressure reading, without diagnosis of hypertension: Secondary | ICD-10-CM | POA: Diagnosis not present

## 2021-07-18 MED ORDER — AMBULATORY NON FORMULARY MEDICATION: 99 refills | Status: AC

## 2021-07-18 NOTE — Progress Notes (Signed)
Morgan Maddox is a 60 y.o. female who presents to  [place] at [facility]  today, 07/18/21, seeking care for the following:  Follow up for BP check Follow up to discuss weight management     ASSESSMENT & PLAN with other pertinent findings:  The primary encounter diagnosis was Morbid obesity (BMI 42.6). A diagnosis of Elevated blood pressure reading was also pertinent to this visit.   Still working on diet/exercise Discussed med options, pt would liek to hold off for now which I think is reasonable Pt aware I will be leaving this practice and would like to see Dr Zigmund Daniel going forward, maybe see a female provider if needed when due for Pap.   Body mass index is 42.92 kg/m.   Home BP average for 2 weeks: 142/84 Seems to be going up and down  BP Readings from Last 5 Encounters:  07/18/21 136/81  06/26/21 139/81  06/08/21 (!) 153/89  03/23/21 (!) 161/73  01/31/21 140/82   Wt Readings from Last 10 Encounters:  07/18/21 274 lb 0.6 oz (124.3 kg)  06/26/21 272 lb 1.3 oz (123.4 kg)  06/08/21 276 lb (125.2 kg)  03/23/21 279 lb 1.3 oz (126.6 kg)  02/07/21 272 lb (123.4 kg)  01/31/21 272 lb (123.4 kg)  01/06/21 270 lb (122.5 kg)  01/02/21 259 lb (117.5 kg)  10/11/20 267 lb 0.6 oz (121.1 kg)  09/09/20 272 lb (123.4 kg)    Pt would like to defer BP and weight loss meds Has been doing well w/ diet/exercise over the past 2 eweks Would like to see me again in another 2 weeks to track progress    There are no Patient Instructions on file for this visit.  No orders of the defined types were placed in this encounter.   Meds ordered this encounter  Medications   AMBULATORY NON FORMULARY MEDICATION    Sig: HOME BP MACHINE AROUND THE ARM    Dispense:  1 Units    Refill:  99      See below for relevant physical exam findings  See below for recent lab and imaging results reviewed  Medications, allergies, PMH, PSH, SocH, FamH  reviewed below    Follow-up instructions: Return for 10/2021 app w/ Dr Zigmund Daniel to establish care .                                        Exam:  BP 136/81 (BP Location: Left Arm, Patient Position: Sitting, Cuff Size: Large)   Pulse 69   Temp 98 F (36.7 C) (Oral)   Wt 274 lb 0.6 oz (124.3 kg)   BMI 42.92 kg/m  Constitutional: VS see above. General Appearance: alert, well-developed, well-nourished, NAD Neck: No masses, trachea midline.  Respiratory: Normal respiratory effort.  Musculoskeletal: Gait normal. Symmetric and independent movement of all extremities Neurological: Normal balance/coordination. No tremor. Skin: warm, dry, intact.  Psychiatric: Normal judgment/insight. Normal mood and affect. Oriented x3.   Current Meds  Medication Sig   AMBULATORY NON FORMULARY MEDICATION Medication Name: Compression stockings, please measure to fit, 20-30 mm Hg   AMBULATORY NON FORMULARY MEDICATION HOME BP MACHINE AROUND THE ARM   calcium-vitamin D (OSCAL WITH D) 500-200 MG-UNIT tablet Take 1 tablet by mouth 3 (three) times daily.   clobetasol (TEMOVATE) 0.05 % external solution Apply 1 application topically as needed.    desonide (  DESOWEN) 0.05 % cream Apply 1 application topically as needed.    fexofenadine (ALLEGRA) 180 MG tablet Take 1 tablet (180 mg total) by mouth daily.   furosemide (LASIX) 20 MG tablet Take 0.5-1 tablets (10-20 mg total) by mouth daily as needed.   ketoconazole (NIZORAL) 2 % shampoo Apply 1 application topically as needed.    traZODone (DESYREL) 50 MG tablet Take 1 tablet (50 mg total) by mouth at bedtime as needed for sleep.    Allergies  Allergen Reactions   Calamine Rash   Pramoxine-Calamine Rash and Other (See Comments)   Macrobid [Nitrofurantoin Macrocrystal] Nausea Only   Versed [Midazolam]    Diphenhydramine Hcl Rash    Patient Active Problem List   Diagnosis Date Noted   Acute deep vein thrombosis (DVT) of  tibial vein of right lower extremity ([place]) 01/06/2021   Tinnitus of right ear 01/02/2021   Bilateral leg edema 01/02/2021   Post-traumatic osteoarthritis of left knee 10/05/2020   Cerebrospinal fluid leak 06/27/2020   Conductive hearing loss 06/27/2020   Atypical chest pain 09/05/2019   Subacute maxillary sinusitis 09/05/2018   Poor compliance with medication 10/08/2016   Panic type anxiety neurosis 07/03/2016   Prediabetes 04/30/2016   Costochondritis 03/04/2016   Elevated blood pressure reading 03/23/2015   Vitamin D deficiency 03/23/2015   Medication management 03/23/2015   Morbid obesity (BMI 43.44)  03/23/2015   History of mononucleosis 04/21/2013   Mucocele of nasal sinus 04/21/2013   History of D&C 04/21/2013   Mixed hyperlipidemia 04/21/2013    Family History  Problem Relation Age of Onset   Heart failure Mother        Long-standing CAD   Heart attack Mother        First MI in 18s   Coronary artery disease Mother    Diabetes Mother    Colon polyps Mother    Coronary artery disease Sister 47   Colon cancer Neg Hx    Esophageal cancer Neg Hx     Social History   Tobacco Use  Smoking Status Former   Packs/day: 0.50   Years: 25.00   Pack years: 12.50   Types: Cigarettes  Smokeless Tobacco Never    Past Surgical History:  Procedure Laterality Date   MASTOIDECTOMY     NASAL SINUS SURGERY     NM MYOVIEW LTD  02/09/2016   : EF 76%. Periapical defect with partial improvement during rest. No wall motion abnormality noted. Likely related to breast attenuation, cannot exclude small area of periapical ischemia. --> Cardiac catheterization not recommended. LOW RISK   OVARIAN CYST SURGERY     TONSILLECTOMY     Transverse Sinus Stent  07/2017   [facility] - > for IHH/pseudotumor cerebri    Immunization History  Administered Date(s) Administered   PFIZER(Purple Top)SARS-COV-2 Vaccination 01/29/2020, 02/18/2020   PPD Test 03/23/2015    Recent Results  (from the past 2160 hour(s))  POCT URINALYSIS DIP (CLINITEK)     Status: Abnormal   Collection Time: 06/08/21  3:35 PM  Result Value Ref Range   Color, UA yellow yellow   Clarity, UA clear clear   Glucose, UA negative negative mg/dL   Bilirubin, UA negative negative   Ketones, POC UA negative negative mg/dL   Spec Grav, UA >=1.030 (A) 1.010 - 1.025   Blood, UA small (A) negative   pH, UA 6.0 5.0 - 8.0   POC PROTEIN,UA trace negative, trace   Urobilinogen, UA 0.2 0.2 or 1.0 E.U./dL  Nitrite, UA Negative Negative   Leukocytes, UA Negative Negative  Urine Culture     Status: Abnormal   Collection Time: 06/08/21  3:36 PM   Specimen: Urine  Result Value Ref Range   MICRO NUMBER: [id number]    SPECIMEN QUALITY: Adequate    Sample Source NOT GIVEN    STATUS: FINAL    ISOLATE 1: Escherichia coli (A)     Comment: Greater than 100,000 CFU/mL of Escherichia coli      Susceptibility   Escherichia coli - URINE CULTURE, REFLEX    AMOX/CLAVULANIC 4 Sensitive     AMPICILLIN 8 Sensitive     AMPICILLIN/SULBACTAM 4 Sensitive     CEFAZOLIN* <=4 Not Reportable      * For infections other than uncomplicated UTI caused by E. coli, K. pneumoniae or P. mirabilis: Cefazolin is resistant if MIC > or = 8 mcg/mL. (Distinguishing susceptible versus intermediate for isolates with MIC < or = 4 mcg/mL requires additional testing.) For uncomplicated UTI caused by E. coli, K. pneumoniae or P. mirabilis: Cefazolin is susceptible if MIC <32 mcg/mL and predicts susceptible to the oral agents cefaclor, cefdinir, cefpodoxime, cefprozil, cefuroxime, cephalexin and loracarbef.     CEFTAZIDIME <=1 Sensitive     CEFEPIME <=1 Sensitive     CEFTRIAXONE <=1 Sensitive     CIPROFLOXACIN <=0.25 Sensitive     LEVOFLOXACIN <=0.12 Sensitive     GENTAMICIN <=1 Sensitive     IMIPENEM <=0.25 Sensitive     NITROFURANTOIN <=16 Sensitive     PIP/TAZO <=4 Sensitive     TOBRAMYCIN <=1 Sensitive     TRIMETH/SULFA* <=20  Sensitive      * For infections other than uncomplicated UTI caused by E. coli, K. pneumoniae or P. mirabilis: Cefazolin is resistant if MIC > or = 8 mcg/mL. (Distinguishing susceptible versus intermediate for isolates with MIC < or = 4 mcg/mL requires additional testing.) For uncomplicated UTI caused by E. coli, K. pneumoniae or P. mirabilis: Cefazolin is susceptible if MIC <32 mcg/mL and predicts susceptible to the oral agents cefaclor, cefdinir, cefpodoxime, cefprozil, cefuroxime, cephalexin and loracarbef. Legend: S = Susceptible  I = Intermediate R = Resistant  NS = Not susceptible * = Not tested  NR = Not reported **NN = See antimicrobic comments   Urine Culture     Status: None   Collection Time: 06/26/21  8:51 AM   Specimen: Urine  Result Value Ref Range   MICRO NUMBER: [id number]    SPECIMEN QUALITY: Adequate    Sample Source NOT GIVEN    STATUS: FINAL    Result: No Growth   Urinalysis, Routine w reflex microscopic     Status: None   Collection Time: 06/26/21  8:51 AM  Result Value Ref Range   Color, Urine YELLOW YELLOW   APPearance CLEAR CLEAR   Specific Gravity, Urine 1.019 1.001 - 1.035   pH 6.0 5.0 - 8.0   Glucose, UA NEGATIVE NEGATIVE   Bilirubin Urine NEGATIVE NEGATIVE   Ketones, ur NEGATIVE NEGATIVE   Hgb urine dipstick NEGATIVE NEGATIVE   Protein, ur NEGATIVE NEGATIVE   Nitrite NEGATIVE NEGATIVE   Leukocytes,Ua NEGATIVE NEGATIVE    No results found.     All questions at time of visit were answered - patient instructed to contact office with any additional concerns or updates. ER/RTC precautions were reviewed with the patient as applicable.   Please note: manual typing as well as voice recognition software may have been used to produce this document -  typos may escape review. Please contact Dr. Sheppard Coil for any needed clarifications.   Total encounter time on date of service, 07/18/21, was 10 minutes spent addressing problems/issues as noted above  in [place], including time spent in discussion with patient regarding the HPI, ROS, confirming history, reviewing Assessment & Plan, as well as time spent on coordination of care, record review.

## 2021-08-31 ENCOUNTER — Other Ambulatory Visit (HOSPITAL_BASED_OUTPATIENT_CLINIC_OR_DEPARTMENT_OTHER): Payer: Self-pay | Admitting: Family Medicine

## 2021-08-31 ENCOUNTER — Other Ambulatory Visit (HOSPITAL_BASED_OUTPATIENT_CLINIC_OR_DEPARTMENT_OTHER): Payer: Self-pay | Admitting: Osteopathic Medicine

## 2021-08-31 DIAGNOSIS — Z1231 Encounter for screening mammogram for malignant neoplasm of breast: Secondary | ICD-10-CM

## 2021-09-13 ENCOUNTER — Ambulatory Visit: Payer: BC Managed Care – PPO

## 2021-09-13 ENCOUNTER — Ambulatory Visit (INDEPENDENT_AMBULATORY_CARE_PROVIDER_SITE_OTHER): Payer: BC Managed Care – PPO

## 2021-09-13 DIAGNOSIS — Z1231 Encounter for screening mammogram for malignant neoplasm of breast: Secondary | ICD-10-CM

## 2021-10-10 ENCOUNTER — Encounter: Payer: Self-pay | Admitting: Family Medicine

## 2021-10-10 ENCOUNTER — Ambulatory Visit (INDEPENDENT_AMBULATORY_CARE_PROVIDER_SITE_OTHER): Payer: BC Managed Care – PPO | Admitting: Family Medicine

## 2021-10-10 ENCOUNTER — Other Ambulatory Visit: Payer: Self-pay

## 2021-10-10 VITALS — BP 142/86 | HR 81 | Temp 97.8°F | Ht 67.0 in | Wt 261.0 lb

## 2021-10-10 DIAGNOSIS — M2550 Pain in unspecified joint: Secondary | ICD-10-CM | POA: Diagnosis not present

## 2021-10-10 DIAGNOSIS — E559 Vitamin D deficiency, unspecified: Secondary | ICD-10-CM | POA: Diagnosis not present

## 2021-10-10 DIAGNOSIS — I87009 Postthrombotic syndrome without complications of unspecified extremity: Secondary | ICD-10-CM | POA: Insufficient documentation

## 2021-10-10 DIAGNOSIS — Z0001 Encounter for general adult medical examination with abnormal findings: Secondary | ICD-10-CM

## 2021-10-10 DIAGNOSIS — R300 Dysuria: Secondary | ICD-10-CM | POA: Diagnosis not present

## 2021-10-10 DIAGNOSIS — E782 Mixed hyperlipidemia: Secondary | ICD-10-CM

## 2021-10-10 DIAGNOSIS — Z0000 Encounter for general adult medical examination without abnormal findings: Secondary | ICD-10-CM | POA: Insufficient documentation

## 2021-10-10 DIAGNOSIS — R030 Elevated blood-pressure reading, without diagnosis of hypertension: Secondary | ICD-10-CM

## 2021-10-10 MED ORDER — CLOBETASOL PROPIONATE 0.05 % EX SOLN: 1.0000 "application " | CUTANEOUS | 3 refills | Status: DC | PRN

## 2021-10-10 MED ORDER — BETAMETHASONE DIPROPIONATE 0.05 % EX CREA: TOPICAL_CREAM | Freq: Two times a day (BID) | CUTANEOUS | 0 refills | Status: DC

## 2021-10-10 NOTE — Assessment & Plan Note (Signed)
Urinalysis ordered

## 2021-10-10 NOTE — Assessment & Plan Note (Signed)
Well adult Additional problems addressed today.  See separate A&P. Orders Placed This Encounter  Procedures  . COMPLETE METABOLIC PANEL WITH GFR  . CBC with Differential  . Lipid Panel w/reflex Direct LDL  . TSH  . Vitamin D (25 hydroxy)  . Urinalysis with Culture Reflex  . Sed Rate (ESR)  . C-reactive protein  . Ambulatory referral to Vascular Surgery    Referral Priority:   Routine    Referral Type:   Surgical    Referral Reason:   Specialty Services Required    Requested Specialty:   Vascular Surgery    Number of Visits Requested:   1  Screenings: She is up-to-date on Pap, mammogram and colon cancer screening.  Additional screenings per lab orders. Immunizations: Declines Tdap, flu, pneumonia and shingles vaccines today. Anticipatory guidance/risk factor reduction: Recommendations per AVS.

## 2021-10-10 NOTE — Assessment & Plan Note (Signed)
Increased joint pain since having COVID.  Checking ESR and CRP for evaluation of possible inflammatory arthritis.

## 2021-10-10 NOTE — Patient Instructions (Signed)
Preventive Care 92-59 Years Old, Female Preventive care refers to lifestyle choices and visits with your health care provider that can promote health and wellness. Preventive care visits are also called wellness exams. What can I expect for my preventive care visit? Counseling Your health care provider may ask you questions about your: Medical history, including: Past medical problems. Family medical history. Pregnancy history. Current health, including: Menstrual cycle. Method of birth control. Emotional well-being. Home life and relationship well-being. Sexual activity and sexual health. Lifestyle, including: Alcohol, nicotine or tobacco, and drug use. Access to firearms. Diet, exercise, and sleep habits. Work and work Statistician. Sunscreen use. Safety issues such as seatbelt and bike helmet use. Physical exam Your health care provider will check your: Height and weight. These may be used to calculate your BMI (body mass index). BMI is a measurement that tells if you are at a healthy weight. Waist circumference. This measures the distance around your waistline. This measurement also tells if you are at a healthy weight and may help predict your risk of certain diseases, such as type 2 diabetes and high blood pressure. Heart rate and blood pressure. Body temperature. Skin for abnormal spots. What immunizations do I need? Vaccines are usually given at various ages, according to a schedule. Your health care provider will recommend vaccines for you based on your age, medical history, and lifestyle or other factors, such as travel or where you work. What tests do I need? Screening Your health care provider may recommend screening tests for certain conditions. This may include: Lipid and cholesterol levels. Diabetes screening. This is done by checking your blood sugar (glucose) after you have not eaten for a while (fasting). Pelvic exam and Pap test. Hepatitis B test. Hepatitis C  test. HIV (human immunodeficiency virus) test. STI (sexually transmitted infection) testing, if you are at risk. Lung cancer screening. Colorectal cancer screening. Mammogram. Talk with your health care provider about when you should start having regular mammograms. This may depend on whether you have a family history of breast cancer. BRCA-related cancer screening. This may be done if you have a family history of breast, ovarian, tubal, or peritoneal cancers. Bone density scan. This is done to screen for osteoporosis. Talk with your health care provider about your test results, treatment options, and if necessary, the need for more tests. Follow these instructions at home: Eating and drinking  Eat a diet that includes fresh fruits and vegetables, whole grains, lean protein, and low-fat dairy products. Take vitamin and mineral supplements as recommended by your health care provider. Do not drink alcohol if: Your health care provider tells you not to drink. You are pregnant, may be pregnant, or are planning to become pregnant. If you drink alcohol: Limit how much you have to 0-1 drink a day. Know how much alcohol is in your drink. In the [place] one drink equals one 12 oz bottle of beer (355 mL), one 5 oz glass of wine (148 mL), or one 1 oz glass of hard liquor (44 mL). Lifestyle Brush your teeth every morning and night with fluoride toothpaste. Floss one time each day. Exercise for at least 30 minutes 5 or more days each week. Do not use any products that contain nicotine or tobacco. These products include cigarettes, chewing tobacco, and vaping devices, such as e-cigarettes. If you need help quitting, ask your health care provider. Do not use drugs. If you are sexually active, practice safe sex. Use a condom or other form of protection to prevent  STIs. If you do not wish to become pregnant, use a form of birth control. If you plan to become pregnant, see your health care provider for a  prepregnancy visit. Take aspirin only as told by your health care provider. Make sure that you understand how much to take and what form to take. Work with your health care provider to find out whether it is safe and beneficial for you to take aspirin daily. Find healthy ways to manage stress, such as: Meditation, yoga, or listening to music. Journaling. Talking to a trusted person. Spending time with friends and family. Minimize exposure to UV radiation to reduce your risk of skin cancer. Safety Always wear your seat belt while driving or riding in a vehicle. Do not drive: If you have been drinking alcohol. Do not ride with someone who has been drinking. When you are tired or distracted. While texting. If you have been using any mind-altering substances or drugs. Wear a helmet and other protective equipment during sports activities. If you have firearms in your house, make sure you follow all gun safety procedures. Seek help if you have been physically or sexually abused. What's next? Visit your health care provider once a year for an annual wellness visit. Ask your health care provider how often you should have your eyes and teeth checked. Stay up to date on all vaccines. This information is not intended to replace advice given to you by your health care provider. Make sure you discuss any questions you have with your health care provider. Document Revised: 04/26/2021 Document Reviewed: 04/26/2021 Elsevier Patient Education  [employer].

## 2021-10-10 NOTE — Assessment & Plan Note (Signed)
She continues to have pain in her lower extremity since having DVT.  Referral placed to vascular for further evaluation.

## 2021-10-10 NOTE — Progress Notes (Signed)
Morgan Maddox - 60 y.o. female MRN [id number]  Date of birth: 1960-11-16  Subjective No chief complaint on file.   HPI Morgan Maddox is a 59 year old female here today for annual exam.  She does have some additional concerns as well.  She had COVID earlier this year complicated by DVT afterwards.  She continues to have pain in her lower extremities since having DVT.  She describes this as a throbbing sensation that is worse when she is up and walking.  She has not had increased swelling.  She is also noted increased joint pain as had rash on her arm and in her scalp.  She did see dermatology and was given clobetasol solution for her scalp which has helped.  Desonide has not helped for rash on her arms.  She has been working on trying to exercise and change her diet.  Weight is down about 16 pounds since last visit.  She is a former smoker, quit approximately 4 years ago.    Review of Systems  Constitutional:  Negative for chills, fever, malaise/fatigue and weight loss.  HENT:  Negative for congestion, ear pain and sore throat.   Eyes:  Negative for blurred vision, double vision and pain.  Respiratory:  Negative for cough and shortness of breath.   Cardiovascular:  Negative for chest pain and palpitations.  Gastrointestinal:  Negative for abdominal pain, blood in stool, constipation, heartburn and nausea.  Genitourinary:  Negative for dysuria and urgency.  Musculoskeletal:  Negative for joint pain and myalgias.  Neurological:  Negative for dizziness and headaches.  Endo/Heme/Allergies:  Does not bruise/bleed easily.  Psychiatric/Behavioral:  Negative for depression. The patient is not nervous/anxious and does not have insomnia.  .  Allergies  Allergen Reactions   Calamine Rash   Pramoxine-Calamine Rash and Other (See Comments)   Macrobid [Nitrofurantoin Macrocrystal] Nausea Only   Versed [Midazolam]    Diphenhydramine Hcl Rash    Past Medical History:  Diagnosis Date   Anxiety     Bilateral ovarian cysts    Chronic headache    Family history of premature CAD    Brother had an MI, sister has CAD. Mother had an MI at age 56. Still alive at 41.   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 Periapical defect with partial improvement during rest. No wall motion abnormality noted. Likely related to breast attenuation, cannot exclude small area of periapical ischemia. --> Cardiac catheterization not recommended. LOW RISK   OVARIAN CYST SURGERY     TONSILLECTOMY     Transverse Sinus Stent  07/2017   [facility] - > for IHH/pseudotumor cerebri    Social History   Socioeconomic History   Marital status: Divorced    Spouse name: Not on file   Number of children: 1   Years of education: Not on file   Highest education level: Not on file  Occupational History   Occupation: [employer]    Employer:  [employer]  Tobacco Use   Smoking status: Former    Packs/day: 0.50    Years: 25.00    Pack years: 12.50    Types: Cigarettes   Smokeless tobacco: Never  Vaping Use   Vaping Use: Never used  Substance and Sexual Activity   Alcohol use: Yes    Alcohol/week: 0.0 standard drinks    Comment: social   Drug use: No   Sexual activity: Not Currently  Other Topics Concern   Not on file  Social History Narrative   She works from home. Works 10-12 hour shifts. Significant social stress.   Lives alone. Spent most of her time alone. Does not usually interact personally  with other people besides her sister   Social Determinants of Health   Financial Resource Strain: Not on file  Food Insecurity: Not on file  Transportation Needs: Not on file  Physical Activity: Not on file  Stress: Not on file  Social Connections: Not on file    Family History  Problem Relation Age of Onset   Heart failure Mother        Long-standing CAD   Heart attack Mother        First MI in 77s   Coronary artery disease Mother    Diabetes Mother    Colon polyps Mother    Coronary artery disease Sister 37   Colon cancer Neg Hx    Esophageal cancer Neg Hx     Health Maintenance  Topic Date Due   Zoster Vaccines- Shingrix (1 of 2) Never done   COVID-19 Vaccine (3 - Pfizer risk series) 03/17/2020   TETANUS/TDAP  10/11/2021 (Originally 09/19/1981)   INFLUENZA VACCINE  02/09/2022 (Originally 06/12/2021)   Pneumococcal Vaccine 64-34 Years old (1 - PCV) 06/26/2022 (Originally 09/19/1968)   MAMMOGRAM  09/14/2023   PAP SMEAR-Modifier  09/23/2023   COLONOSCOPY (Pts 45-60yr Insurance coverage will need to be confirmed)  09/10/2027   Hepatitis C Screening  Completed   HIV Screening  Completed   HPV VACCINES  Aged Out     ----------------------------------------------------------------------------------------------------------------------------------------------------------------------------------------------------------------- Physical Exam BP (!) 142/86 (BP Location: Left Arm, Patient Position: Sitting, Cuff Size: Large)   Pulse 81   Temp 97.8 F (36.6 C)   Ht _0  (1.702 m)   Wt 261 lb (118.4 kg)   SpO2 96%   BMI 40.88 kg/m   Physical Exam Constitutional:      General: She is not in acute distress. HENT:     Head: Normocephalic and atraumatic.     Right Ear: Tympanic membrane and ear canal normal.     Left Ear: Tympanic membrane and ear canal normal.     Nose: Nose normal.  Eyes:     General: No scleral icterus.    Conjunctiva/sclera: Conjunctivae normal.   Neck:     Thyroid: No thyromegaly.  Cardiovascular:     Rate and Rhythm: Normal rate and regular rhythm.     Heart sounds: Normal heart sounds.  Pulmonary:     Effort: Pulmonary effort is normal.     Breath sounds: Normal breath sounds.  Abdominal:     General: Bowel sounds are normal. There is no distension.     Palpations: Abdomen is soft.     Tenderness: There is no abdominal tenderness. There is no guarding.  Musculoskeletal:        General: Normal range of motion.     Cervical back: Normal range of motion and neck supple.  Lymphadenopathy:  Cervical: No cervical adenopathy.  Skin:    General: Skin is warm and dry.     Findings: No rash.     Comments: Dry, scaly rash to bilateral elbows, worse on the left.  Flaky rash along scalp.  Neurological:     General: No focal deficit present.     Mental Status: She is alert and oriented to person, place, and time.     Cranial Nerves: No cranial nerve deficit.     Coordination: Coordination normal.  Psychiatric:        Mood and Affect: Mood normal.        Behavior: Behavior normal.    ------------------------------------------------------------------------------------------------------------------------------------------------------------------------------------------------------------------- Assessment and Plan  Well adult exam Well adult Additional problems addressed today.  See separate A&P. Orders Placed This Encounter  Procedures   COMPLETE METABOLIC PANEL WITH GFR   CBC with Differential   Lipid Panel w/reflex Direct LDL   TSH   Vitamin D (25 hydroxy)   Urinalysis with Culture Reflex   Sed Rate (ESR)   C-reactive protein   Ambulatory referral to Vascular Surgery    Referral Priority:   Routine    Referral Type:   Surgical    Referral Reason:   Specialty Services Required    Requested Specialty:   Vascular Surgery    Number of Visits Requested:   1  Screenings: She is up-to-date on Pap, mammogram and colon  cancer screening.  Additional screenings per lab orders. Immunizations: Declines Tdap, flu, pneumonia and shingles vaccines today. Anticipatory guidance/risk factor reduction: Recommendations per AVS.  Post-thrombotic syndrome She continues to have pain in her lower extremity since having DVT.  Referral placed to vascular for further evaluation.  Arthralgia Increased joint pain since having COVID.  Checking ESR and CRP for evaluation of possible inflammatory arthritis.  Dysuria Urinalysis ordered.   Meds ordered this encounter  Medications   clobetasol (TEMOVATE) 0.05 % external solution    Sig: Apply 1 application topically as needed.    Dispense:  50 mL    Refill:  3   betamethasone dipropionate 0.05 % cream    Sig: Apply topically 2 (two) times daily.    Dispense:  30 g    Refill:  0    No follow-ups on file.    This visit occurred during the SARS-CoV-2 public health emergency.  Safety protocols were in place, including screening questions prior to the visit, additional usage of staff PPE, and extensive cleaning of exam room while observing appropriate contact time as indicated for disinfecting solutions.

## 2021-10-11 ENCOUNTER — Telehealth: Payer: Self-pay

## 2021-10-11 LAB — URINALYSIS W MICROSCOPIC + REFLEX CULTURE
Bilirubin Urine: NEGATIVE
Glucose, UA: NEGATIVE
Hgb urine dipstick: NEGATIVE
Leukocyte Esterase: NEGATIVE
Nitrites, Initial: NEGATIVE
Specific Gravity, Urine: 1.027 (ref 1.001–1.035)
pH: 5.5 (ref 5.0–8.0)

## 2021-10-11 LAB — COMPLETE METABOLIC PANEL WITH GFR
AG Ratio: 1.8 (calc) (ref 1.0–2.5)
ALT: 9 U/L (ref 6–29)
AST: 10 U/L (ref 10–35)
Albumin: 4.4 g/dL (ref 3.6–5.1)
Alkaline phosphatase (APISO): 73 U/L (ref 37–153)
BUN: 14 mg/dL (ref 7–25)
CO2: 28 mmol/L (ref 20–32)
Calcium: 9.8 mg/dL (ref 8.6–10.4)
Chloride: 103 mmol/L (ref 98–110)
Creat: 0.79 mg/dL (ref 0.50–1.03)
Globulin: 2.5 g/dL (calc) (ref 1.9–3.7)
Glucose, Bld: 102 mg/dL — ABNORMAL HIGH (ref 65–99)
Potassium: 4.6 mmol/L (ref 3.5–5.3)
Sodium: 139 mmol/L (ref 135–146)
Total Bilirubin: 0.5 mg/dL (ref 0.2–1.2)
Total Protein: 6.9 g/dL (ref 6.1–8.1)
eGFR: 86 mL/min/{1.73_m2} (ref >=60)

## 2021-10-11 LAB — CBC WITH DIFFERENTIAL/PLATELET
Absolute Monocytes: 316 cells/uL (ref 200–950)
Basophils Absolute: 50 cells/uL (ref 0–200)
Basophils Relative: 0.8 %
Eosinophils Absolute: 236 cells/uL (ref 15–500)
Eosinophils Relative: 3.8 %
HCT: 43.3 % (ref 35.0–45.0)
Hemoglobin: 14.5 g/dL (ref 11.7–15.5)
Lymphs Abs: 1779 cells/uL (ref 850–3900)
MCH: 28.2 pg (ref 27.0–33.0)
MCHC: 33.5 g/dL (ref 32.0–36.0)
MCV: 84.2 fL (ref 80.0–100.0)
MPV: 10.2 fL (ref 7.5–12.5)
Monocytes Relative: 5.1 %
Neutro Abs: 3819 cells/uL (ref 1500–7800)
Neutrophils Relative %: 61.6 %
Platelets: 338 10*3/uL (ref 140–400)
RBC: 5.14 10*6/uL — ABNORMAL HIGH (ref 3.80–5.10)
RDW: 13 % (ref 11.0–15.0)
Total Lymphocyte: 28.7 %
WBC: 6.2 10*3/uL (ref 3.8–10.8)

## 2021-10-11 LAB — SEDIMENTATION RATE: Sed Rate: 6 mm/h (ref 0–30)

## 2021-10-11 LAB — LIPID PANEL W/REFLEX DIRECT LDL
Cholesterol: 188 mg/dL (ref <200)
HDL: 67 mg/dL (ref >=50)
LDL Cholesterol (Calc): 103 mg/dL (calc) — ABNORMAL HIGH
Non-HDL Cholesterol (Calc): 121 mg/dL (calc) (ref <130)
Total CHOL/HDL Ratio: 2.8 (calc) (ref <5.0)
Triglycerides: 90 mg/dL (ref <150)

## 2021-10-11 LAB — C-REACTIVE PROTEIN: CRP: 4.4 mg/L (ref <8.0)

## 2021-10-11 LAB — TSH: TSH: 2.24 mIU/L (ref 0.40–4.50)

## 2021-10-11 LAB — VITAMIN D 25 HYDROXY (VIT D DEFICIENCY, FRACTURES): Vit D, 25-Hydroxy: 34 ng/mL (ref 30–100)

## 2021-10-11 LAB — NO CULTURE INDICATED

## 2021-10-11 NOTE — Telephone Encounter (Signed)
Result note sent to patient

## 2021-10-11 NOTE — Telephone Encounter (Signed)
Pt called requesting results from yesterday's UTI testing.   Thanks

## 2021-10-13 ENCOUNTER — Encounter: Payer: Self-pay | Admitting: Family Medicine

## 2021-10-16 ENCOUNTER — Encounter: Payer: BC Managed Care – PPO | Admitting: Osteopathic Medicine

## 2021-11-02 ENCOUNTER — Other Ambulatory Visit: Payer: Self-pay | Admitting: *Deleted

## 2021-11-02 DIAGNOSIS — I82441 Acute embolism and thrombosis of right tibial vein: Secondary | ICD-10-CM

## 2021-11-21 ENCOUNTER — Other Ambulatory Visit: Payer: Self-pay

## 2021-11-21 ENCOUNTER — Ambulatory Visit: Payer: BC Managed Care – PPO | Admitting: Physician Assistant

## 2021-11-21 ENCOUNTER — Ambulatory Visit (HOSPITAL_COMMUNITY)
Admission: RE | Admit: 2021-11-21 | Discharge: 2021-11-21 | Disposition: A | Discharge status: Home / Self Care | Payer: BC Managed Care – PPO | Source: Ambulatory Visit | Attending: Vascular Surgery | Admitting: Vascular Surgery

## 2021-11-21 VITALS — BP 128/86 | HR 74 | Temp 98.3°F | Resp 18 | Ht 66.0 in | Wt 271.0 lb

## 2021-11-21 DIAGNOSIS — I82441 Acute embolism and thrombosis of right tibial vein: Secondary | ICD-10-CM | POA: Diagnosis not present

## 2021-11-21 DIAGNOSIS — R0989 Other specified symptoms and signs involving the circulatory and respiratory systems: Secondary | ICD-10-CM

## 2021-11-21 DIAGNOSIS — I872 Venous insufficiency (chronic) (peripheral): Secondary | ICD-10-CM | POA: Diagnosis not present

## 2021-11-21 NOTE — Progress Notes (Signed)
Office Note     CC:  follow up Requesting Provider:  Luetta Nutting, DO  HPI: Morgan Maddox is a 61 y.o. (May 16, 1961) female who presents for evaluation of bilateral lower extremity edema and pain.  Patient believes her symptoms started after being diagnosed with tibial vein DVTs of bilateral lower extremities after having COVID in February of this year.  Since that time she notices more edema on the right than the left leg.  She only wears compression when flying.  She periodically elevates her legs.  She works part-time however this requires her to sit at a desk for 9 hours/day of work.  She gained some weight back over the holidays however before that she was down 16 pounds.  She also complains of numbness and pins-and-needles feelings in her lateral thighs and hips when standing however also complains of lumbar spine pain.  She denies any history of venous ulcers, trauma, or prior vascular intervention of bilateral lower extremities.  She smoked for 35 years however quit for the past 4 years.                     Past Medical History:  Diagnosis Date   Anxiety    Bilateral ovarian cysts    Chronic headache    Family history of premature CAD    Brother had an MI, sister has CAD. Mother had an MI at age 54. Still alive at 18.   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: EF 76%. Periapical defect with partial improvement during rest. No wall motion abnormality noted. Likely related to breast  attenuation, cannot exclude small area of periapical ischemia. --> Cardiac catheterization not recommended. LOW RISK   OVARIAN CYST SURGERY     TONSILLECTOMY     Transverse Sinus Stent  07/2017   [facility] - > for IHH/pseudotumor cerebri    Social History   Socioeconomic History   Marital status: Divorced    Spouse name: Not on file   Number of children: 1   Years of education: Not on file   Highest education level: Not on file  Occupational History   Occupation: [employer]    Employer: [employer]  Tobacco Use   Smoking status: Former    Packs/day: 0.50    Years: 25.00    Pack years: 12.50    Types: Cigarettes   Smokeless tobacco: Never  Vaping Use   Vaping Use: Never used  Substance and Sexual Activity   Alcohol use: Yes    Alcohol/week: 0.0 standard drinks    Comment: social   Drug use: No   Sexual activity: Not Currently  Other Topics Concern   Not on file  Social History Narrative   She works from home. Works 10-12 hour shifts. Significant social stress.   Lives alone. Spent most of her time alone. Does not usually interact personally with other people besides her sister   Social Determinants of  Health   Financial Resource Strain: Not on file  Food Insecurity: Not on file  Transportation Needs: Not on file  Physical Activity: Not on file  Stress: Not on file  Social Connections: Not on file  Intimate Partner Violence: Not on file    Family History  Problem Relation Age of Onset   Heart failure Mother        Long-standing CAD   Heart attack Mother        First MI in 23s   Coronary artery disease Mother    Diabetes Mother    Colon polyps Mother    Coronary artery disease Sister 57   Colon cancer Neg Hx    Esophageal cancer Neg Hx     Current Outpatient Medications  Medication Sig Dispense Refill   AMBULATORY NON FORMULARY MEDICATION Medication Name: Compression stockings, please measure to fit, 20-30 mm Hg 1 Units 99   AMBULATORY NON  FORMULARY MEDICATION HOME BP MACHINE AROUND THE ARM 1 Units 99   aspirin EC 81 MG tablet Take 81 mg by mouth daily. Swallow whole.     betamethasone dipropionate 0.05 % cream Apply topically 2 (two) times daily. 30 g 0   clobetasol (TEMOVATE) 0.05 % external solution Apply 1 application topically as needed. 50 mL 3   furosemide (LASIX) 20 MG tablet Take 0.5-1 tablets (10-20 mg total) by mouth daily as needed. 30 tablet 0   ketoconazole (NIZORAL) 2 % shampoo Apply 1 application topically as needed.      traZODone (DESYREL) 50 MG tablet Take 1 tablet (50 mg total) by mouth at bedtime as needed for sleep. 90 tablet 0   fexofenadine (ALLEGRA) 180 MG tablet Take 1 tablet (180 mg total) by mouth daily. (Patient not taking: Reported on 11/21/2021) 90 tablet 0   No current facility-administered medications for this visit.    Allergies  Allergen Reactions   Calamine Rash   Pramoxine-Calamine Rash and Other (See Comments)   Macrobid [Nitrofurantoin Macrocrystal] Nausea Only   Versed [Midazolam]    Diphenhydramine Hcl Rash     REVIEW OF SYSTEMS:   [X]  denotes positive finding, [ ]  denotes negative finding Cardiac  Comments:  Chest pain or chest pressure:    Shortness of breath upon exertion:    Short of breath when lying flat:    Irregular heart rhythm:        Vascular    Pain in calf, thigh, or hip brought on by ambulation:    Pain in feet at night that wakes you up from your sleep:     Blood clot in your veins:    Leg swelling:         Pulmonary    Oxygen at home:    Productive cough:     Wheezing:         Neurologic    Sudden weakness in arms or legs:     Sudden numbness in arms or legs:     Sudden onset of difficulty speaking or slurred speech:    Temporary loss of vision in one eye:     Problems with dizziness:         Gastrointestinal    Blood in stool:     Vomited blood:         Genitourinary    Burning when urinating:     Blood in urine:        Psychiatric     Major depression:  Hematologic    Bleeding problems:    Problems with blood clotting too easily:        Skin    Rashes or ulcers:        Constitutional    Fever or chills:      PHYSICAL EXAMINATION:  Vitals:   11/21/21 1026  BP: 128/86  Pulse: 74  Resp: 18  Temp: 98.3 F (36.8 C)  TempSrc: Temporal  SpO2: 99%  Weight: 271 lb (122.9 kg)  Height: 5\' 6"  (1.676 m)    General:  WDWN in NAD; vital signs documented above Gait: Not observed HENT: WNL, normocephalic Pulmonary: normal non-labored breathing , without Rales, rhonchi,  wheezing Cardiac: regular HR Abdomen: soft, NT, no masses Skin: without rashes Vascular Exam/Pulses:  Right Left  Radial 2+ (normal) 2+ (normal)  DP By doppler 2+ (normal)   Extremities: Pitting edema to the level of the mid shin bilaterally but no venous ulcers or varicosities Musculoskeletal: no muscle wasting or atrophy  Neurologic: A&O X 3;  No focal weakness or paresthesias are detected Psychiatric:  The pt has Normal affect.   Non-Invasive Vascular Imaging:   Bilateral lower extremity venous duplex negative for acute DVT or chronic DVT    ASSESSMENT/PLAN:: 61 y.o. female here for evaluation of bilateral lower extremity edema and pain status post bilateral tibial vein DVTs  -Bilateral lower extremity venous duplex was negative for acute or chronic DVT today.  Bilateral lower extremity pitting edema likely related to history of tibial vein DVTs bilaterally.  We discussed managing symptoms with 15 to 20 mmHg knee-high compression stockings which should be worn on a daily basis.  She should also elevate her legs periodically above her heart throughout the day as well as avoid prolonged sitting and standing.  Another main concern of hers was the numbness and pins-and-needles feeling in her hips and lateral thighs with standing or walking.  The symptoms are symmetrical.  She has an easily palpable pedal pulse on her left lower extremity  however only dopplerable signals in her right foot.  She probably has a component of PAD especially of right lower extremity however these would not explain the symmetrical symptoms.  She does not complain of typical claudication and thus would not intervene on right lower extremity even if there was occlusive or near occlusive lesion.  We also discussed the importance of weight loss and an active lifestyle which she completely agrees with.  She will try the above conservative measures of daily compression, avoiding prolonged sitting and standing, and periodic elevation of her legs.  We will not see her back for any follow-up.  If she does develop claudication or has a drastic change in edema she will call/return to office.   Dagoberto Ligas, PA-C [facility] [phone]  Clinic MD:   Stanford Breed

## 2021-11-30 ENCOUNTER — Encounter (HOSPITAL_COMMUNITY): Payer: Self-pay

## 2021-11-30 ENCOUNTER — Emergency Department (HOSPITAL_COMMUNITY)
Admission: EM | Admit: 2021-11-30 | Discharge: 2021-11-30 | Disposition: A | Discharge status: Home / Self Care | Payer: BC Managed Care – PPO | Attending: Emergency Medicine | Admitting: Emergency Medicine

## 2021-11-30 ENCOUNTER — Emergency Department (HOSPITAL_COMMUNITY): Payer: BC Managed Care – PPO

## 2021-11-30 ENCOUNTER — Other Ambulatory Visit: Payer: Self-pay

## 2021-11-30 DIAGNOSIS — Z7982 Long term (current) use of aspirin: Secondary | ICD-10-CM | POA: Insufficient documentation

## 2021-11-30 DIAGNOSIS — R0789 Other chest pain: Secondary | ICD-10-CM | POA: Diagnosis not present

## 2021-11-30 DIAGNOSIS — R072 Precordial pain: Secondary | ICD-10-CM | POA: Diagnosis not present

## 2021-11-30 DIAGNOSIS — R079 Chest pain, unspecified: Secondary | ICD-10-CM | POA: Diagnosis not present

## 2021-11-30 LAB — CBC
HCT: 40.4 % (ref 36.0–46.0)
Hemoglobin: 13.2 g/dL (ref 12.0–15.0)
MCH: 28.6 pg (ref 26.0–34.0)
MCHC: 32.7 g/dL (ref 30.0–36.0)
MCV: 87.4 fL (ref 80.0–100.0)
Platelets: 367 10*3/uL (ref 150–400)
RBC: 4.62 MIL/uL (ref 3.87–5.11)
RDW: 13.3 % (ref 11.5–15.5)
WBC: 10.3 10*3/uL (ref 4.0–10.5)
nRBC: 0 % (ref 0.0–0.2)

## 2021-11-30 LAB — BASIC METABOLIC PANEL
Anion gap: 11 (ref 5–15)
BUN: 14 mg/dL (ref 6–20)
CO2: 26 mmol/L (ref 22–32)
Calcium: 9.2 mg/dL (ref 8.9–10.3)
Chloride: 102 mmol/L (ref 98–111)
Creatinine, Ser: 0.92 mg/dL (ref 0.44–1.00)
GFR, Estimated: >60 mL/min (ref >60)
Glucose, Bld: 107 mg/dL — ABNORMAL HIGH (ref 70–99)
Potassium: 4.1 mmol/L (ref 3.5–5.1)
Sodium: 139 mmol/L (ref 135–145)

## 2021-11-30 LAB — TROPONIN I (HIGH SENSITIVITY)
Troponin I (High Sensitivity): <2 ng/L (ref <18)
Troponin I (High Sensitivity): 3 ng/L (ref <18)

## 2021-11-30 MED ORDER — ACETAMINOPHEN 325 MG PO TABS
650.0000 mg | ORAL_TABLET | Freq: Once | ORAL | Status: AC
  Administered 2021-11-30: 650 mg via ORAL
  Filled 2021-11-30: qty 2

## 2021-11-30 MED ORDER — LIDOCAINE 5 % EX PTCH
1.0000 | MEDICATED_PATCH | CUTANEOUS | Status: DC
  Administered 2021-11-30: 1 via TRANSDERMAL
  Filled 2021-11-30: qty 1

## 2021-11-30 NOTE — ED Provider Triage Note (Signed)
Emergency Medicine Provider Triage Evaluation Note  Morgan Maddox , a 61 y.o. female  was evaluated in triage.  Pt complains of chest pain off and on since November.  She feels short of breath, today it radiates to her back.  Feels different today because its rating to the back and more severe than previous.  Recent air travel on Christmas, history of bilateral DVT secondary to [place].  Not on anticoagulation..  Review of Systems  Positive: CP, SOB Negative: N/V  Physical Exam  BP (!) 175/72 (BP Location: Right Arm)    Pulse 85    Temp 98.9 F (37.2 C) (Oral)    Resp 20    Ht 5\' 6"  (1.676 m)    Wt 123.4 kg    SpO2 100%    BMI 43.90 kg/m  Gen:   Awake, no distress   Resp:  Normal effort  MSK:   Moves extremities without difficulty  Other:  Radial pulse 2+ equal bilaterally, S1-S2  Medical Decision Making  Medically screening exam initiated at 12:31 PM.  Appropriate orders placed.  Morgan Maddox was informed that the remainder of the evaluation will be completed by another provider, this initial triage assessment does not replace that evaluation, and the importance of remaining in the ED until their evaluation is complete.     Sherrill Raring, PA-C 11/30/21 1232

## 2021-11-30 NOTE — ED Triage Notes (Signed)
Pt arrived POV from home c/o CP that has been going on and off since Nov. Pt states she unloaded a storage unit be herself and it started hurting, got better before but then started up again. Pt states she woke up this morning and now it is radiating to her shoulder blades.

## 2021-11-30 NOTE — Discharge Instructions (Addendum)
Please return to the ER if you have worsening chest pain, shortness of breath, pain radiating to your jaw, shoulder, or back, sweats or fainting. Otherwise see the Cardiologist or your primary care doctor as requested.  We have sent a secure chat message to Dr. Ellyn Hack about this visit as well.  Start taking daily baby aspirin.

## 2021-11-30 NOTE — ED Provider Notes (Signed)
[facility] EMERGENCY DEPARTMENT Provider Note   CSN: [id number] Arrival date & time: 11/30/21  1213     History  Chief Complaint  Patient presents with   Chest Pain    Morgan Maddox is a 61 y.o. female.   Chest Pain  A 61 year old patient with a history of obesity presents for evaluation of chest pain. Initial onset of pain was more than 6 hours ago. The patient's chest pain is well-localized, is sharp and is not worse with exertion. The patient's chest pain is not middle- or left-sided, is not described as heaviness/pressure/tightness and does not radiate to the arms/jaw/neck. The patient does not complain of nausea and denies diaphoresis. The patient has no history of stroke, has no history of peripheral artery disease, has not smoked in the past 90 days, denies any history of treated diabetes, has no relevant family history of coronary artery disease (first degree relative at less than age 85), is not hypertensive and has no history of hypercholesterolemia.   Patient states that her chest pain originally started in November when she was lifting a heavy box.  She states that she reaggravated her chest pain after physically exerting herself recently.  Patient reports that she has had no burning sensation in her throat.  Has had no leg swelling, has had no hemoptysis.  Patient has not had recent hospitalization or surgery.  Patient states that she has not attempted to treat her pain with over-the-counter analgesic therapy.  HPI: A 61 year old patient with a history of obesity presents for evaluation of chest pain. Initial onset of pain was more than 6 hours ago. The patient's chest pain is well-localized, is sharp and is not worse with exertion. The patient's chest pain is not middle- or left-sided, is not described as heaviness/pressure/tightness and does not radiate to the arms/jaw/neck. The patient does not complain of nausea and denies diaphoresis. The patient has  no history of stroke, has no history of peripheral artery disease, has not smoked in the past 90 days, denies any history of treated diabetes, has no relevant family history of coronary artery disease (first degree relative at less than age 56), is not hypertensive and has no history of hypercholesterolemia.   Home Medications Prior to Admission medications   Medication Sig Start Date End Date Taking? Authorizing Provider  AMBULATORY NON FORMULARY MEDICATION Medication Name: Compression stockings, please measure to fit, 20-30 mm Hg 01/31/21   Emeterio Reeve, DO  AMBULATORY NON FORMULARY MEDICATION HOME BP MACHINE AROUND THE ARM 07/18/21   Emeterio Reeve, DO  aspirin EC 81 MG tablet Take 81 mg by mouth daily. Swallow whole.    [provider]  betamethasone dipropionate 0.05 % cream Apply topically 2 (two) times daily. 10/10/21   Luetta Nutting, DO  clobetasol (TEMOVATE) 0.05 % external solution Apply 1 application topically as needed. 10/10/21   Luetta Nutting, DO  fexofenadine (ALLEGRA) 180 MG tablet Take 1 tablet (180 mg total) by mouth daily. Patient not taking: Reported on 11/21/2021 03/15/21   Emeterio Reeve, DO  furosemide (LASIX) 20 MG tablet Take 0.5-1 tablets (10-20 mg total) by mouth daily as needed. 01/02/21   Luetta Nutting, DO  ketoconazole (NIZORAL) 2 % shampoo Apply 1 application topically as needed.  09/28/19   [provider]  traZODone (DESYREL) 50 MG tablet Take 1 tablet (50 mg total) by mouth at bedtime as needed for sleep. 09/02/20   Emeterio Reeve, DO      Allergies  Calamine, Pramoxine-calamine, Macrobid [nitrofurantoin macrocrystal], Versed [midazolam], and Diphenhydramine hcl    Review of Systems   Review of Systems  Cardiovascular:  Positive for chest pain.  All other systems reviewed and are negative.  Physical Exam Updated Vital Signs BP (!) 162/91    Pulse 79    Temp 98.4 F (36.9 C)    Resp 20    Ht 5\' 6"  (1.676 m)    Wt 123.4 kg     SpO2 99%    BMI 43.90 kg/m  Physical Exam Vitals and nursing note reviewed.  Constitutional:      General: She is not in acute distress.    Appearance: She is well-developed. She is obese. She is not ill-appearing or diaphoretic.  HENT:     Head: Normocephalic and atraumatic.  Eyes:     Conjunctiva/sclera: Conjunctivae normal.  Cardiovascular:     Rate and Rhythm: Normal rate and regular rhythm.     Heart sounds: No murmur heard. Pulmonary:     Effort: Pulmonary effort is normal. No respiratory distress.     Breath sounds: Normal breath sounds.  Chest:     Chest wall: Tenderness present.  Abdominal:     Palpations: Abdomen is soft.     Tenderness: There is no abdominal tenderness.  Musculoskeletal:        General: No swelling.     Cervical back: Neck supple.  Skin:    General: Skin is warm and dry.     Capillary Refill: Capillary refill takes less than 2 seconds.  Neurological:     Mental Status: She is alert.  Psychiatric:        Mood and Affect: Mood normal.    ED Results / Procedures / Treatments   Labs (all labs ordered are listed, but only abnormal results are displayed) Labs Reviewed  BASIC METABOLIC PANEL - Abnormal; Notable for the following components:      Result Value   Glucose, Bld 107 (*)    All other components within normal limits  CBC  TROPONIN I (HIGH SENSITIVITY)  TROPONIN I (HIGH SENSITIVITY)    EKG EKG Interpretation  Date/Time:  Thursday November 30 2021 12:20:01 EST Ventricular Rate:  80 PR Interval:  148 QRS Duration: 78 QT Interval:  374 QTC Calculation: 431 R Axis:   52 Text Interpretation: Normal sinus rhythm Normal ECG When compared with ECG of 14-Dec-2002 16:29, PREVIOUS ECG IS PRESENT No acute changes Confirmed by Varney Biles ([id number]) on 11/30/2021 5:36:07 PM  Radiology DG Chest 2 View  Result Date: 11/30/2021 CLINICAL DATA:  Chest pain EXAM: CHEST - 2 VIEW COMPARISON:  01/06/2021 FINDINGS: Cardiac size is within normal  limits. There are no signs of pulmonary edema or focal pulmonary consolidation. Marked elevation of left hemidiaphragm has not changed. Left lateral CP angle is indistinct. There is no pneumothorax. IMPRESSION: No active cardiopulmonary disease. Electronically Signed   By: Elmer Picker M.D.   On: 11/30/2021 13:10    Procedures Procedures    Medications Ordered in ED Medications  lidocaine (LIDODERM) 5 % 1 patch (1 patch Transdermal Patch Applied 11/30/21 1711)  acetaminophen (TYLENOL) tablet 650 mg (650 mg Oral Given 11/30/21 1711)    ED Course/ Medical Decision Making/ A&P   HEAR Score: 2                       Medical Decision Making Amount and/or Complexity of Data Reviewed Independent Historian: parent and friend External Data Reviewed: notes.  Details: Vascular surgery Labs: ordered. Radiology: ordered and independent interpretation performed. ECG/medicine tests: ordered and independent interpretation performed.  Risk OTC drugs.   A 61 year old patient with a history of obesity presents for evaluation of chest pain.  Vascular surgery note reviewed from 11/21/2021 reveals that the patient no longer has acute occlusive deep vein thrombosis.  Differential diagnosis was chest pain includes but sounds to the following: ACS, PE, PNA, PMX, costochondritis.  Given that the onset of the patient's pain occurred after strenuous physical activity suspect that the cause of her pain is costochondritis.  The pain is reproducible on my exam.  Patient has not had associated diaphoresis or nausea.  Will obtain chest x-ray and laboratory test to further evaluate.  Patient's hear score is 2.  Troponins negative x2.  CBC and CMP unremarkable.  Chest x-ray reviewed independently is negative for acute cardiopulmonary pathology.  EKG reviewed and revealed that the patient is in normal sinus rhythm without signs of ischemia.  Patient's pain was treated with Tylenol and lidocaine patch.  Will  discharge patient home with instructions to follow-up with her PCP and to reduce her physical activity load.  Patient is in agreement this plan.   Final Clinical Impression(s) / ED Diagnoses Final diagnoses:  Precordial chest pain  Chest wall pain    Rx / DC Orders ED Discharge Orders     None         Zachery Dakins, MD 11/30/21 Josephine Igo, MD 12/01/21 [id number]

## 2021-12-01 ENCOUNTER — Telehealth: Payer: Self-pay | Admitting: General Practice

## 2021-12-01 NOTE — Telephone Encounter (Signed)
Transition Care Management Follow-up Telephone Call Date of discharge and from where: 11/30/21 from [facility] How have you been since you were released from the hospital? Doing a little better.  Any questions or concerns? No  Items Reviewed: Did the pt receive and understand the discharge instructions provided? Yes  Medications obtained and verified? No  Other? No  Any new allergies since your discharge? No  Dietary orders reviewed? Yes Do you have support at home? Yes   Home Care and Equipment/Supplies: Were home health services ordered? no  Functional Questionnaire: (I = Independent and D = Dependent) ADLs: I  Bathing/Dressing- I  Meal Prep- I  Eating- I  Maintaining continence- I  Transferring/Ambulation- I  Managing Meds- I  Follow up appointments reviewed:  PCP Hospital f/u appt confirmed? No   Specialist Hospital f/u appt confirmed? Yes  Scheduled to see Dr. Ellyn Hack in April. Trying to see if they can push that up. Are transportation arrangements needed? No  If their condition worsens, is the pt aware to call PCP or go to the Emergency Dept.? Yes Was the patient provided with contact information for the PCP's office or ED? Yes Was to pt encouraged to call back with questions or concerns? Yes

## 2021-12-18 ENCOUNTER — Ambulatory Visit: Payer: BC Managed Care – PPO | Admitting: Cardiology

## 2022-01-01 ENCOUNTER — Ambulatory Visit: Payer: BC Managed Care – PPO | Admitting: General Practice

## 2022-01-20 IMAGING — DX DG KNEE COMPLETE 4+V*L*
8 series · 8 of 8 positions shown · non-contrast
Comparison: None.

CLINICAL DATA: Pain

EXAM:
RIGHT KNEE - 1-2 VIEW; LEFT KNEE - COMPLETE 4+ VIEW

[knee lat (1 of 2)]
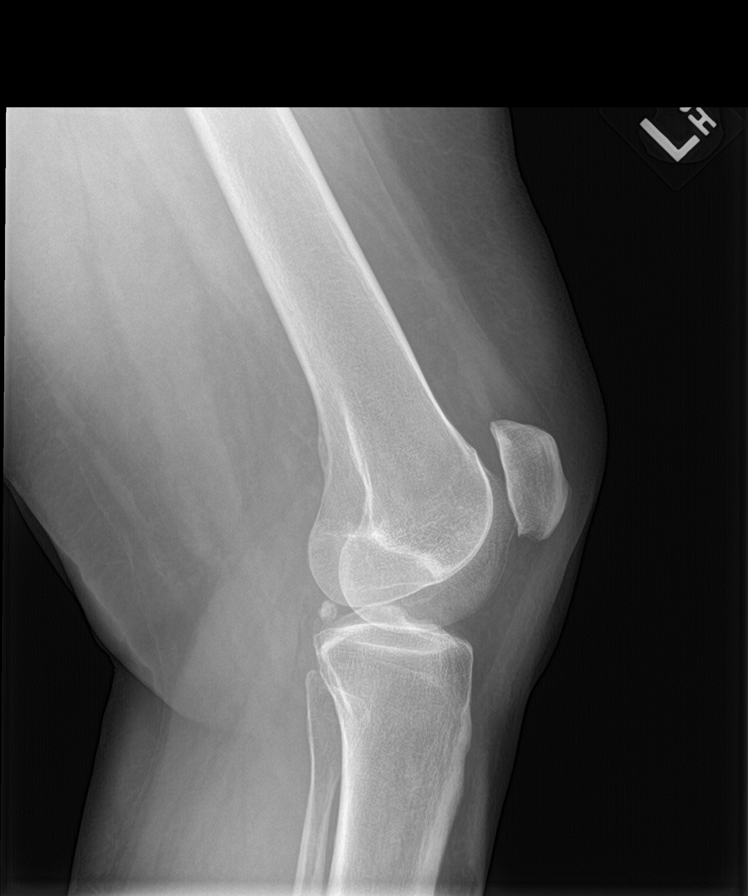

[knee lat (2 of 2)]
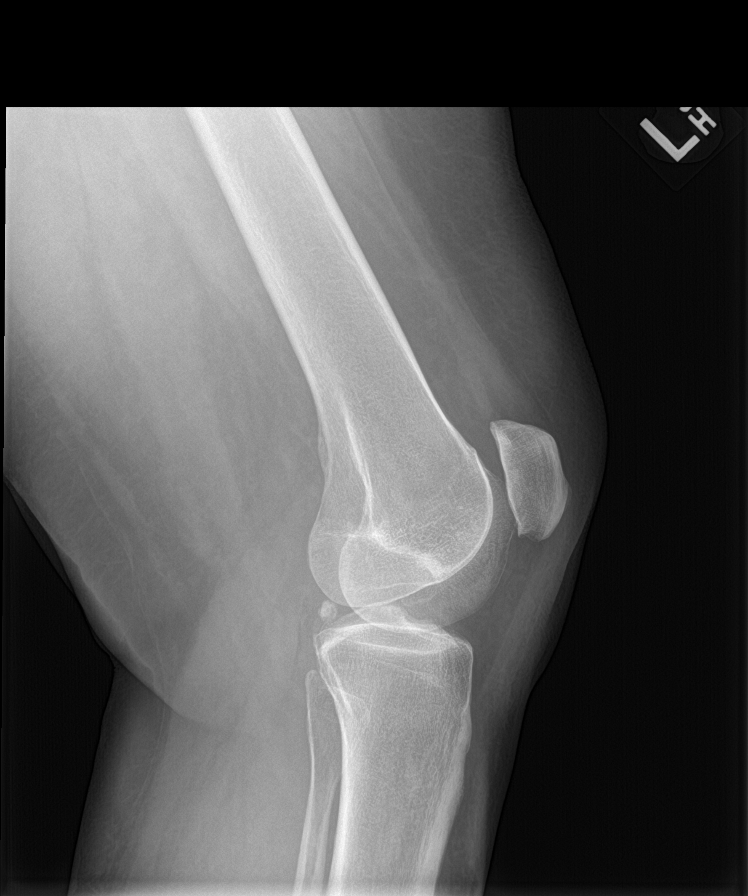

[knee sunrise (1 of 2)]
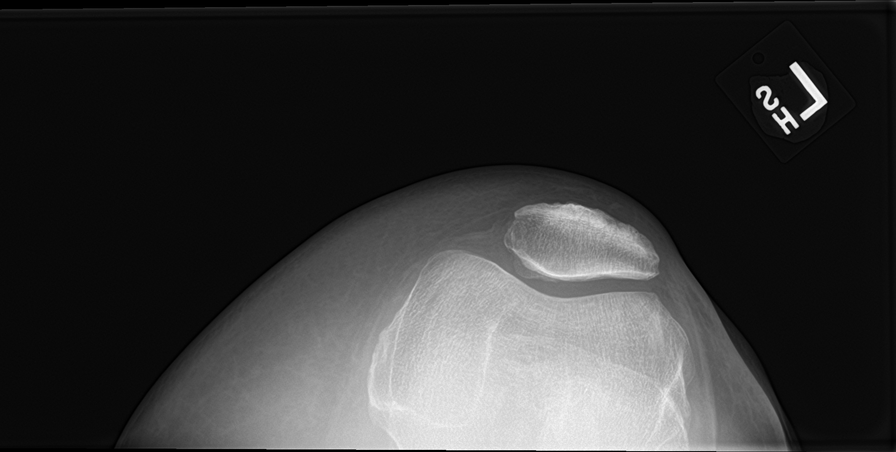

[knee sunrise (2 of 2)]
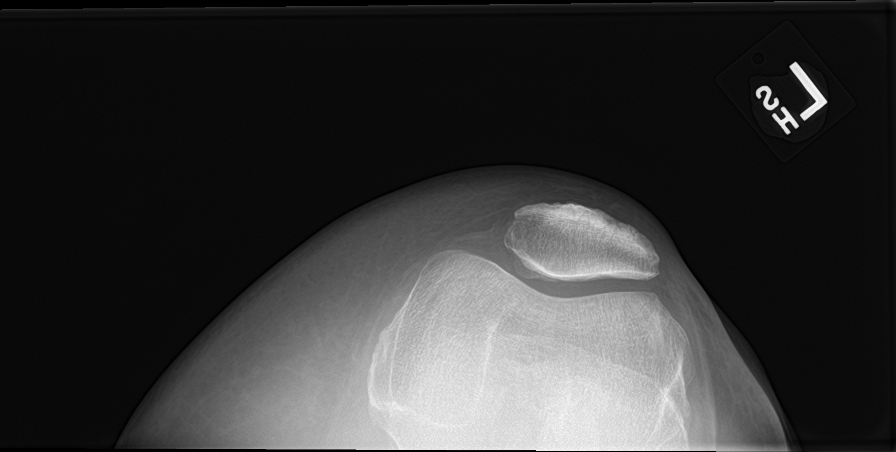

[knee ap bilat standing (1 of 4)]
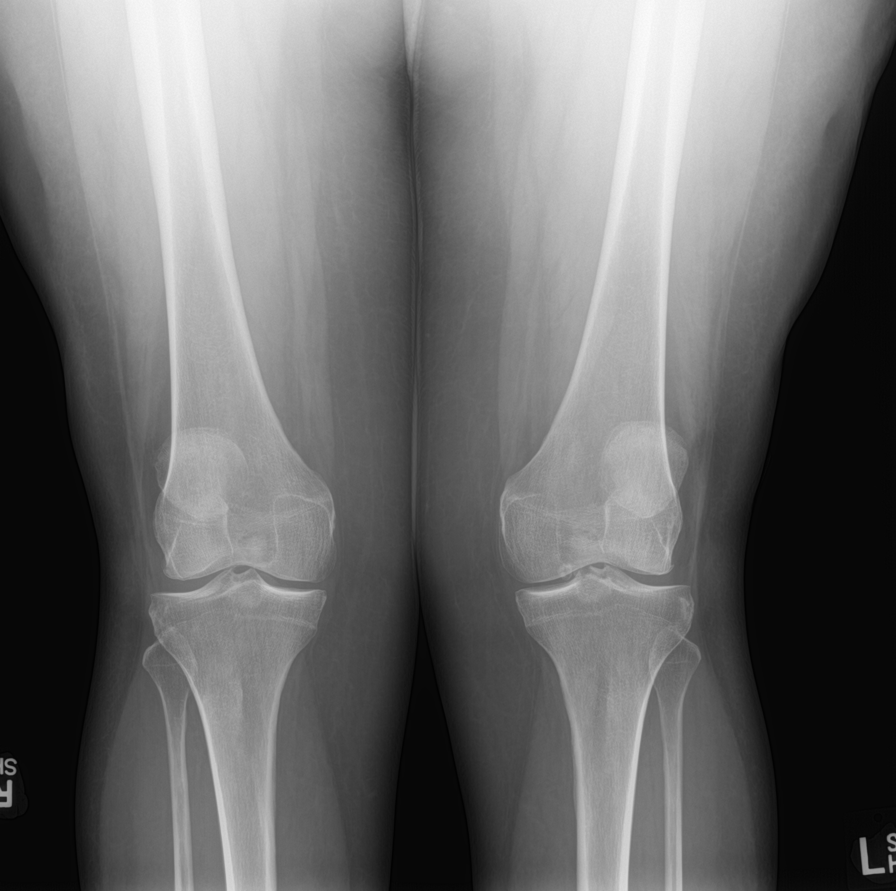

[knee ap bilat standing (2 of 4)]
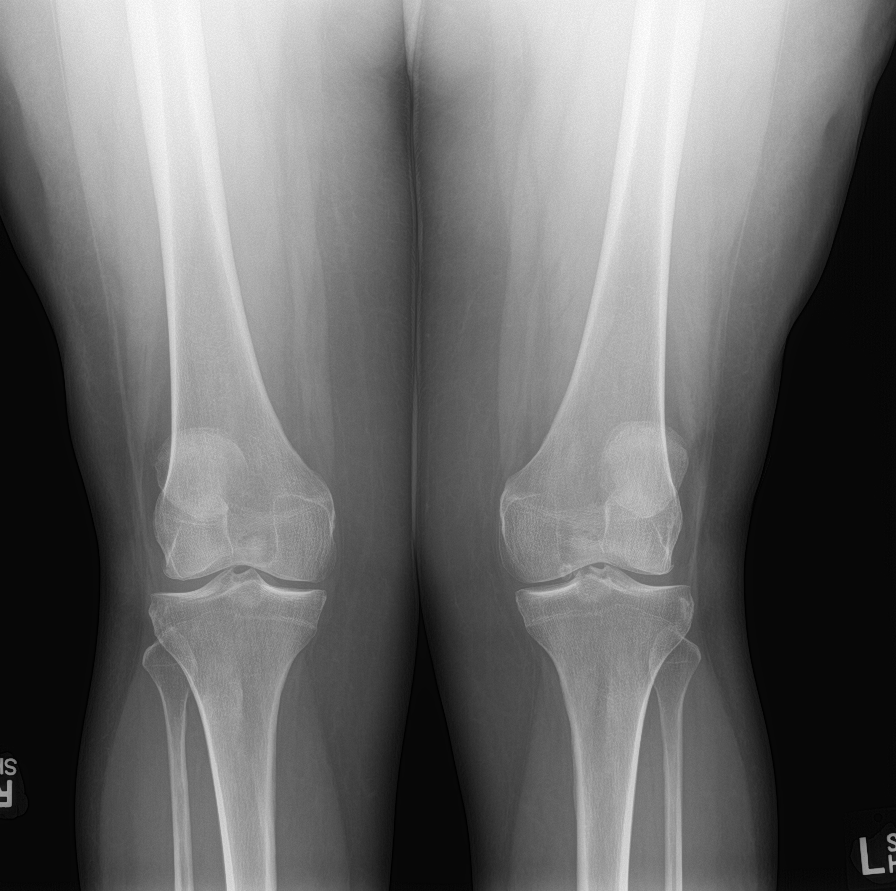

[knee ap bilat standing (3 of 4)]
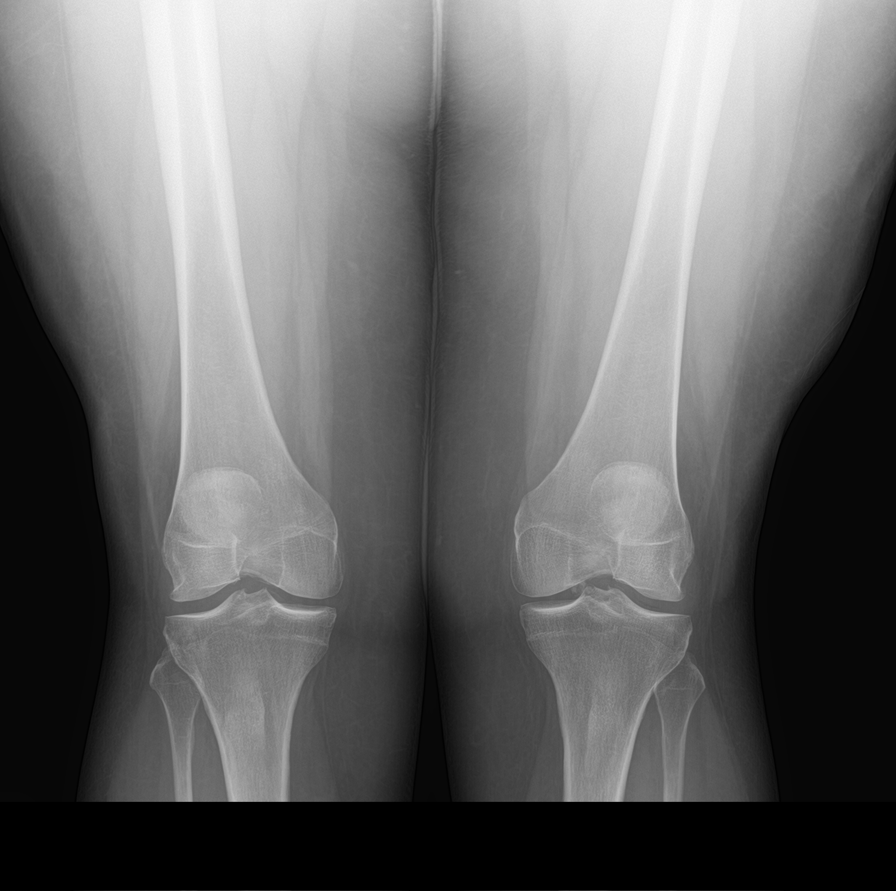

[knee ap bilat standing (4 of 4)]
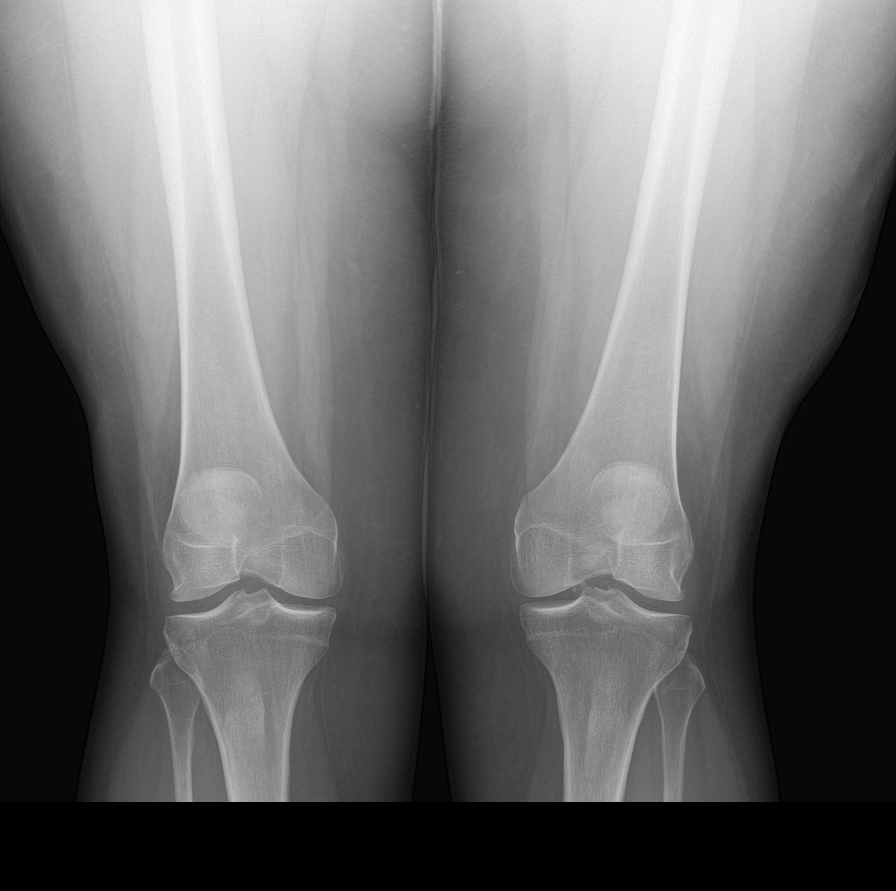

[8 of 8 positions shown; findings below may reference images not displayed]

FINDINGS: Mild tricompartmental degenerative changes are noted involving the
left knee, greatest within the medial and patellofemoral
compartments. There is an intra-articular loose body in the
posterior joint space on the left. There is no acute displaced
fracture or dislocation. Minimal degenerative changes are noted of
the right knee.
IMPRESSION: 1. No acute osseous abnormality.
2. Mild tricompartmental degenerative changes involving the left
knee. There is an intra-articular loose body within the posterior
joint space on the left.
3. Minimal degenerative changes are noted of the right knee.

## 2022-02-12 DIAGNOSIS — Z0289 Encounter for other administrative examinations: Secondary | ICD-10-CM

## 2022-02-20 ENCOUNTER — Encounter (INDEPENDENT_AMBULATORY_CARE_PROVIDER_SITE_OTHER): Payer: Self-pay | Admitting: Bariatrics

## 2022-02-20 ENCOUNTER — Ambulatory Visit (INDEPENDENT_AMBULATORY_CARE_PROVIDER_SITE_OTHER): Payer: BC Managed Care – PPO | Admitting: Bariatrics

## 2022-02-20 VITALS — BP 140/90 | HR 77 | Temp 98.2°F | Ht 66.0 in | Wt 272.0 lb

## 2022-02-20 DIAGNOSIS — R0602 Shortness of breath: Secondary | ICD-10-CM | POA: Diagnosis not present

## 2022-02-20 DIAGNOSIS — R030 Elevated blood-pressure reading, without diagnosis of hypertension: Secondary | ICD-10-CM

## 2022-02-20 DIAGNOSIS — E559 Vitamin D deficiency, unspecified: Secondary | ICD-10-CM | POA: Diagnosis not present

## 2022-02-20 DIAGNOSIS — R5383 Other fatigue: Secondary | ICD-10-CM

## 2022-02-20 DIAGNOSIS — G932 Benign intracranial hypertension: Secondary | ICD-10-CM

## 2022-02-20 DIAGNOSIS — E782 Mixed hyperlipidemia: Secondary | ICD-10-CM

## 2022-02-20 DIAGNOSIS — Z1331 Encounter for screening for depression: Secondary | ICD-10-CM | POA: Diagnosis not present

## 2022-02-20 DIAGNOSIS — Z6841 Body Mass Index (BMI) 40.0 and over, adult: Secondary | ICD-10-CM

## 2022-02-20 DIAGNOSIS — R7303 Prediabetes: Secondary | ICD-10-CM

## 2022-02-21 LAB — COMPREHENSIVE METABOLIC PANEL
ALT: 14 IU/L (ref 0–32)
AST: 12 IU/L (ref 0–40)
Albumin/Globulin Ratio: 1.7 (ref 1.2–2.2)
Albumin: 4.3 g/dL (ref 3.8–4.9)
Alkaline Phosphatase: 99 IU/L (ref 44–121)
BUN/Creatinine Ratio: 23 (ref 9–23)
BUN: 14 mg/dL (ref 6–24)
Bilirubin Total: 0.3 mg/dL (ref 0.0–1.2)
CO2: 25 mmol/L (ref 20–29)
Calcium: 9.2 mg/dL (ref 8.7–10.2)
Chloride: 101 mmol/L (ref 96–106)
Creatinine, Ser: 0.62 mg/dL (ref 0.57–1.00)
Globulin, Total: 2.5 g/dL (ref 1.5–4.5)
Glucose: 106 mg/dL — ABNORMAL HIGH (ref 70–99)
Potassium: 4.9 mmol/L (ref 3.5–5.2)
Sodium: 141 mmol/L (ref 134–144)
Total Protein: 6.8 g/dL (ref 6.0–8.5)
eGFR: 103 mL/min/{1.73_m2} (ref >59)

## 2022-02-21 LAB — LIPID PANEL WITH LDL/HDL RATIO
Cholesterol, Total: 216 mg/dL — ABNORMAL HIGH (ref 100–199)
HDL: 80 mg/dL (ref >39)
LDL Chol Calc (NIH): 118 mg/dL — ABNORMAL HIGH (ref 0–99)
LDL/HDL Ratio: 1.5 ratio (ref 0.0–3.2)
Triglycerides: 106 mg/dL (ref 0–149)
VLDL Cholesterol Cal: 18 mg/dL (ref 5–40)

## 2022-02-21 LAB — VITAMIN D 25 HYDROXY (VIT D DEFICIENCY, FRACTURES): Vit D, 25-Hydroxy: 31.1 ng/mL (ref 30.0–100.0)

## 2022-02-21 LAB — HEMOGLOBIN A1C
Est. average glucose Bld gHb Est-mCnc: 126 mg/dL
Hgb A1c MFr Bld: 6 % — ABNORMAL HIGH (ref 4.8–5.6)

## 2022-02-21 LAB — TSH+T4F+T3FREE
Free T4: 0.96 ng/dL (ref 0.82–1.77)
T3, Free: 2.5 pg/mL (ref 2.0–4.4)
TSH: 1.4 u[IU]/mL (ref 0.450–4.500)

## 2022-02-21 LAB — INSULIN, RANDOM: INSULIN: 12.4 u[IU]/mL (ref 2.6–24.9)

## 2022-02-21 NOTE — Progress Notes (Signed)
? ? ? ?Chief Complaint:  ? ?OBESITY ?Morgan Maddox (MR# [id number]) is a 61 y.o. female who presents for evaluation and treatment of obesity and related comorbidities. Current BMI is Body mass index is 43.9 kg/m?[name] Morgan Maddox has been struggling with her weight for many years and has been unsuccessful in either losing weight, maintaining weight loss, or reaching her healthy weight goal. ? ?Morgan Maddox states that she does not like to cook as she gave up eating out. She states that she craves sugar and starches. She is a good water drinker.  ? ?Morgan Maddox is currently in the action stage of change and ready to dedicate time achieving and maintaining a healthier weight. Morgan Maddox is interested in becoming our patient and working on intensive lifestyle modifications including (but not limited to) diet and exercise for weight loss. ? ?Morgan Maddox's habits were reviewed today and are as follows: her desired weight loss is 97 pounds, she has been heavy most of her life, she started gaining weight in my late 20's, her heaviest weight ever was 288.7 pounds, she is a picky eater and doesn't like to eat healthier foods, she has significant food cravings issues, she snacks frequently in the evenings, she skips meals frequently, she is frequently drinking liquids with calories, she frequently makes poor food choices, she frequently eats larger portions than normal, and she struggles with emotional eating. ? ?Depression Screen ?Morgan Maddox's Food and Mood (modified PHQ-9) score was 13. ? ? ?  02/20/2022  ?  8:16 AM  ?Depression screen PHQ 2/9  ?Decreased Interest 2  ?Down, Depressed, Hopeless 1  ?PHQ - 2 Score 3  ?Altered sleeping 1  ?Tired, decreased energy 3  ?Change in appetite 2  ?Feeling bad or failure about yourself  2  ?Trouble concentrating 1  ?Moving slowly or fidgety/restless 1  ?Suicidal thoughts 0  ?PHQ-9 Score 13  ?Difficult doing work/chores Not difficult at all  ? ?Subjective:  ? ?1. Other fatigue ?Vennie will continue activities. Morgan Maddox admits  to daytime somnolence and admits to waking up still tired. Patient has a history of symptoms of morning fatigue. Anniyah generally gets  5-8  hours of sleep per night, and states that she has difficulty falling asleep and generally restful sleep. Snoring is present. Apneic episodes are not present. Epworth Sleepiness Score is 10.   ? ?2. SOB (shortness of breath) on exertion ?Morgan Maddox notes increasing shortness of breath with exercising and seems to be worsening over time with weight gain. She notes getting out of breath sooner with activity than she used to. This has not gotten worse recently. Karianne denies shortness of breath at rest or orthopnea.  ? ?3. Prediabetes ?Morgan Maddox is currently not on medications. Her mother has diabetes.  ? ?4. Vitamin D deficiency ?Morgan Maddox is not on medication currently.  ? ?5. Mixed hyperlipidemia ?Morgan Maddox is currently not on medications. ? ?6. Elevated blood pressure reading ?Morgan Maddox is not on medications. Her blood pressure today was 140/90. Her blood pressure 10/03/2021 was 162/91. ? ?7. Idiopathic intracranial hypertension ?Morgan Maddox is currently not on medications. ? ?Assessment/Plan:  ? ?1. Other fatigue ?Collette will gradually increase activities and exercise. We will check TSH and review EKG today. Morgan Maddox does feel that her weight is causing her energy to be lower than it should be. Fatigue may be related to obesity, depression or many other causes. Labs will be ordered, and in the meanwhile, Morgan Maddox will focus on self care including making healthy food choices, increasing physical activity and focusing on stress reduction.  ?-  EKG 12-Lead ?- TSH+T4F+T3Free ? ?2. SOB (shortness of breath) on exertion ?Morgan Maddox does feel that she gets out of breath more easily that she used to when she exercises. Morgan Maddox's shortness of breath appears to be obesity related and exercise induced. She has agreed to work on weight loss and gradually increase exercise to treat her exercise induced shortness of breath. Will  continue to monitor closely.  ?- TSH+T4F+T3Free ? ?3. Prediabetes ?Morgan Maddox will minimize starches and sweets.We will check A1C and insulin. She will continue to work on weight loss, exercise, and decreasing simple carbohydrates to help decrease the risk of diabetes.  ? ?- Insulin, random ?- Hemoglobin A1c ?- Comprehensive metabolic panel ? ?4. Vitamin D deficiency ?Low Vitamin D level contributes to fatigue and are associated with obesity, breast, and colon cancer. We will check Vitamin D today and Camiah will follow-up for routine testing of Vitamin D, at least 2-3 times per year to avoid over-replacement. ? ?- VITAMIN D 25 Hydroxy (Vit-D Deficiency, Fractures) ? ?5. Mixed hyperlipidemia ?Cardiovascular risk and specific lipid/LDL goals reviewed.  Morgan Maddox will read labels. She will decrease saturated fats and trans fats. We will check lipid panel and CMP today. We discussed several lifestyle modifications today and Morgan Maddox will continue to work on diet, exercise and weight loss efforts. Orders and follow up as documented in patient record.  ? ?Counseling ?Intensive lifestyle modifications are the first line treatment for this issue. ?Dietary changes: Increase soluble fiber. Decrease simple carbohydrates. ?Exercise changes: Moderate to vigorous-intensity aerobic activity 150 minutes per week if tolerated. ?Lipid-lowering medications: see documented in medical record. ? ?- Lipid Panel With LDL/HDL Ratio ?- Comprehensive metabolic panel ? ?6. Elevated blood pressure reading ?We will monitor and she will consider blood pressure medications per her primary care physician or our office. We will check lipid panel and CMP today.  ? ?- Lipid Panel With LDL/HDL Ratio ?- Comprehensive metabolic panel ? ?7. Idiopathic intracranial hypertension ?Morgan Maddox will follow up with neurologist. We will check lipid panel and CMP today. Morgan Maddox is working on healthy weight loss and exercise to improve blood pressure control. We will watch for signs  of hypotension as she continues her lifestyle modifications. ? ?- Lipid Panel With LDL/HDL Ratio ?- Comprehensive metabolic panel ? ?8. Depression screen ?Morgan Maddox had a positive depression screening. Depression is commonly associated with obesity and often results in emotional eating behaviors. We will monitor this closely and work on CBT to help improve the non-hunger eating patterns. Referral to Psychology may be required if no improvement is seen as she continues in our clinic.  ? ?9. Class 3 severe obesity with serious comorbidity and body mass index (BMI) of 40.0 to 44.9 in adult, unspecified obesity type (Morgan Maddox) ?Morgan Maddox is currently in the action stage of change and her goal is to continue with weight loss efforts. I recommend Morgan Maddox begin the structured treatment plan as follows: ? ?She has agreed to the Category 2 Plan. ? ?Morgan Maddox will continue meal planning. We will review labs from 11/30/2021 BMP, CBC, and glucose. She will stop skipping meals.  ? ?Exercise goals: No exercise has been prescribed at this time.  ? ?Behavioral modification strategies: increasing lean protein intake, decreasing simple carbohydrates, increasing vegetables, increasing water intake, decreasing eating out, no skipping meals, meal planning and cooking strategies, keeping healthy foods in the home, and planning for success. ? ?She was informed of the importance of frequent follow-up visits to maximize her success with intensive lifestyle modifications for her multiple health  conditions. She was informed we would discuss her lab results at her next visit unless there is a critical issue that needs to be addressed sooner. Morgan Maddox agreed to keep her next visit at the agreed upon time to discuss these results. ? ?Objective:  ? ?Blood pressure 140/90, pulse 77, temperature 98.2 ?F (36.8 ?C), height '5\' 6"'$  (1.676 m), weight 272 lb (123.4 kg), SpO2 97 %. Body mass index is 43.9 kg/m?. ? ?EKG: Normal sinus rhythm, rate 65 bpm. ? ?Indirect Calorimeter  completed today shows a VO2 of 246 and a REE of 1699.  Her calculated basal metabolic rate is 1884 thus her basal metabolic rate is worse than expected. ? ?General: Cooperative, alert, well developed, in no

## 2022-02-22 ENCOUNTER — Encounter (INDEPENDENT_AMBULATORY_CARE_PROVIDER_SITE_OTHER): Payer: Self-pay | Admitting: Bariatrics

## 2022-02-26 ENCOUNTER — Ambulatory Visit: Payer: BC Managed Care – PPO | Admitting: Cardiology

## 2022-02-26 ENCOUNTER — Encounter: Payer: Self-pay | Admitting: Cardiology

## 2022-02-26 VITALS — BP 170/80 | HR 78 | Ht 66.0 in | Wt 274.4 lb

## 2022-02-26 DIAGNOSIS — R0789 Other chest pain: Secondary | ICD-10-CM

## 2022-02-26 DIAGNOSIS — E782 Mixed hyperlipidemia: Secondary | ICD-10-CM | POA: Diagnosis not present

## 2022-02-26 DIAGNOSIS — I82441 Acute embolism and thrombosis of right tibial vein: Secondary | ICD-10-CM

## 2022-02-26 DIAGNOSIS — M940 Chondrocostal junction syndrome [Tietze]: Secondary | ICD-10-CM | POA: Diagnosis not present

## 2022-02-26 MED ORDER — IBUPROFEN 200 MG PO CAPS: ORAL_CAPSULE | ORAL | 0 refills | Status: AC

## 2022-02-26 MED ORDER — PREDNISONE 10 MG PO TABS: ORAL_TABLET | ORAL | 0 refills | Status: AC

## 2022-02-26 NOTE — Patient Instructions (Addendum)
Medication Instructions:  ?   Start a prednisone taper  ?60 mg  take( 6 tablets  of 10 mg ) for 2 days ?40 mg  take (4 tablets of 10 mg ) for 2 days ?20 mg  take (2 tablets of 10 mg) for 2 days ?10 mg  take  ( 1 tablet of 10 mg) for 2 days   - on the first day of the 10 mg dose -  ?start taking   ?Ibuprofen 800 mg (4 tablets of 200 mg) three times a day for 3 days  ?Ibuprofen 600 mg (3 tablets of 200 mg) three times a day for 3 days  ?Ibuprofen 400 mg (2 tablets of 200 mg) three times a day for 3 days  ?Ibuprofen 200 mg (1 tablets of 200 mg) three times a day for 3 days  ? ?Drink plenty of water when start taking ibuprofen  60 to70 oz water a day  eat sometinhg with medications ? ?Do not sleep ? on left side , try  to prop yourself up. ?   ?*If you neeIbuprofen 800 mg (4 tablets of 200 mg) three times a day for 3 days d a refill on your cardiac medications before your next appointment, please call your pharmacy* ? ? ?Lab Work: ?Not needed ?If you have labs (blood work) drawn today and your tests are completely normal, you will receive your results only by: ?MyChart Message (if you have MyChart) OR ?A paper copy in the mail ?If you have any lab test that is abnormal or we need to change your treatment, we will call you to review the results. ? ? ?Testing/Procedures: ?Not needed ? ? ?Follow-Up: ?At [facility], you and your health needs are our priority.  As part of our continuing mission to provide you with exceptional heart care, we have created designated Provider Care Teams.  These Care Teams include your primary Cardiologist (physician) and Advanced Practice Providers (APPs -  Physician Assistants and Nurse Practitioners) who all work together to provide you with the care you need, when you need it. ? ?  ? ?Your next appointment:   ?2 month(s) ? ?The format for your next appointment:   ?In Person ? ?Provider:   ?Glenetta Hew, MD  ? ? ?Other Instructions  ?

## 2022-02-26 NOTE — Assessment & Plan Note (Signed)
She just had cholesterol levels checked.  Cholesterol 216 and LDL 118. ? ?She has had quite a few episodes of costochondritis but is never really fully had a coronary evaluation.  We will consider Coronary Calcium Scoring versus Coronary CTA and follow-up depending on how her symptoms are doing.  This will also determine target LDL levels based on risk stratification. ?

## 2022-02-26 NOTE — Assessment & Plan Note (Signed)
Completed short DOAC.  This was in the setting of COVID-most likely the hypercoagulable state leading to the cause.  Had some hemoptysis while on DOAC.  Therefore stopped somewhat immaturely, however follow-up Dopplers did not show any recurrence or residual thrombus.  Just some mild colitis findings. ?

## 2022-02-26 NOTE — Assessment & Plan Note (Signed)
Extremely reproducible chest pain.  All up and down Sternal border.  Concern for costochondritis ?

## 2022-02-26 NOTE — Progress Notes (Signed)
? ? ?Primary Care Provider: Luetta Nutting, DO ?Cardiologist: Glenetta Hew, MD ?Electrophysiologist: None ? ?Clinic Note: ?Chief Complaint  ?Patient presents with  ? Follow-up  ?  Reevaluation of chest pain  ? ?=================================== ? ?ASSESSMENT/PLAN  ? ?Problem List Items Addressed This Visit   ? ?  ? Cardiology Problems  ? Mixed hyperlipidemia (Chronic)  ?  She just had cholesterol levels checked.  Cholesterol 216 and LDL 118. ? ?She has had quite a few episodes of costochondritis but is never really fully had a coronary evaluation.  We will consider Coronary Calcium Scoring versus Coronary CTA and follow-up depending on how her symptoms are doing.  This will also determine target LDL levels based on risk stratification. ? ?  ?  ? Deep vein thrombosis (DVT) of tibial vein of right lower extremity (HCC) (Chronic)  ?  Completed short DOAC.  This was in the setting of COVID-most likely the hypercoagulable state leading to the cause.  Had some hemoptysis while on DOAC.  Therefore stopped somewhat immaturely, however follow-up Dopplers did not show any recurrence or residual thrombus.  Just some mild colitis findings. ? ?  ?  ?  ? Other  ? Acute costochondritis - Primary  ?  Chest pain is clearly costochondral in nature.  Worse with palpation.  Worse with deep inspiration and cough.  Worse with certain movements of the upper body.  Improved with Lidoderm patch as well as Midol. ? ?Plan: ?Based on how long she has been having symptoms, we need to be very aggressive with treatment.  More prolonged course. ?We will start with steroid taper: 60 mg daily x3 days, 40 mg daily x3 days, 20 mg daily x2 days and then 10 mg daily x2 days. ?When initiating the 10 mg dosage initiate ibuprofen taper: 800 mg 3 times daily x3 days, 6 mg 3 times daily x3 days, 400 mg 3 times daily x3 days then 200 mg 3 times daily times 3 days ?Ensure that she drinks plenty of water (60-70 ounces of water a day) while on ibuprofen.   Also ensure that she is eating something while taking ibuprofen. ?  ?  ? Relevant Orders  ? EKG 12-Lead  ? Atypical chest pain  ?  Extremely reproducible chest pain.  All up and down Sternal border.  Concern for costochondritis ? ?  ?  ? Morbid obesity (BMI 43.44)  (Chronic)  ?  Unfortunately, her weight is up and down.  Lots of issues with her weight that all started with her cerebrospinal fluid issues. ? ?=> I do think that a GLP-1 agonist such as Mounjaro would be a good idea. ? ?  ?  ? ? ?=================================== ? ?HPI:   ? ?Morgan Maddox is a 61 y.o. female with a PMH below who presents today for reevaluation of chest pain.  40-monthfollow-up..[name]?She returns today at the request of MLuetta Nutting DO. ? ?Morgan DKeymiah Lyleswas last seen on June 06, 2020: Was in good mood.  Happy.  Much less panic spells and tachycardia palpitations.  Sleeping better.  Unfortunately, after having lost weight, she had gained it all back.  Swelling reduced after cutting down salt and fluid.  Was on low-dose Lasix.  No further costochondritis.  ? ?Recent Hospitalizations:  ?11/30/2021:  ER - CP -> at the time of presenting to the ER was at least 6 hours of symptoms of pain.  Well localized, sharp, not worse with exertion.  Worse with movement.  Not  described as a heaviness or pressure.  No radiation, however there are several focal distinct locations.  No associated dyspnea. ->  Symptoms initially began in November while lifting heavy boxes.  She apparently reaggravated her chest discomfort after physical exertion again.  This occurred on the day of presentation. => Thankfully, was 90 days out from her last cigarette. => Symptoms were reproducible on exam.  Consistent with costochondritis.  Actually placed Lidoderm patch ? ?Reviewed  CV studies:   ? ?The following studies were reviewed today: (if available, images/films reviewed: From Epic Chart or Care Everywhere) ?Her sequential venous Dopplers in February and May  2022, along with January 2023 were all reviewed. ? ? ?Interval History:  ? ?Morgan Maddox returns here today for evaluation of chest pain has been ongoing since January. ?She tells me that she moved from her large 5000 foot plus house to a smaller 2 to 3000 foot condo in November.  Her daughter was just recently married and she is now moving into place by herself.  She had several packing containers at the outside of her house and decided to unload the entire container by herself back in November.  She probably overdid herself and felt pretty poorly that day and the next day.  She then decided to stop for a while.  Was out of town and then returned in January when she started unloading the boxes.  While carrying some of the boxes on dolly, she felt the significant discomfort in the middle of her chest.  It hurt bad enough that it doubled her over and she could barely catch her breath she felt very bad. ? ?She says that ever since then, she has been having intermittent episodes of this discomfort and is sometimes on the right side of her breastbone but more often on the left.  But she also notes that on both mid axillary lines in the lower ribs as well.  It is not necessarily exertional because it occurs at rest but is worsened with certain movements, with cough.  Some days the pain is so bad that she just cannot even go to work.  The episode that took her to the emergency room was probably the first episode and she did rule out for MI. ? ?She still has these spells, albeit not as frequent.  But they are very painful when she has them and she is very limited by the pain.  She gets very fearful and stressed when they occur but otherwise is actually been doing better with her stress and anxiety.   She says that now that her daughter has moved out and is married (actually they just had the wedding not that long ago) she feels like she should be much less stressed. ? ?CV Review of Symptoms (Summary) ?Cardiovascular  ROS: positive for - chest pain and has also had some occasional swelling but much better after completing her anticoagulation.  Has some dyspnea with coughing on occasion, but this has been present since COVID.  She is quite deconditioned and notably has exertional dyspnea.  Does not exercise. ?negative for - irregular heartbeat, orthopnea, palpitations, paroxysmal nocturnal dyspnea, rapid heart rate, shortness of breath, or syncope/near syncope, TIA/amaurosis fugax, claudication ? ?REVIEWED OF SYSTEMS  ? ?Review of Systems  ?Constitutional:  Positive for malaise/fatigue. Negative for weight loss.  ?Respiratory:  Negative for shortness of breath.   ?Cardiovascular:  Positive for chest pain (Per HPI). Negative for leg swelling (Has not had any issues with this in  a while).  ?Genitourinary:  Negative for dysuria and hematuria.  ?Musculoskeletal:  Positive for back pain (Sometimes has back pain with chest pain), joint pain (Diffuse joint pains) and neck pain.  ?Neurological:  Negative for dizziness and focal weakness.  ?Psychiatric/Behavioral:  Positive for depression (Pretty well controlled). Negative for memory loss. The patient is nervous/anxious and has insomnia.   ? ?I have reviewed and (if needed) personally updated the patient's problem list, medications, allergies, past medical and surgical history, social and family history.  ? ?PAST MEDICAL HISTORY  ? ?Past Medical History:  ?Diagnosis Date  ? Anxiety   ? Back pain   ? Bilateral ovarian cysts   ? Chest pain   ? Chronic headache   ? Constipation   ? Edema, lower extremity   ? Family history of premature CAD   ? Brother had an MI, sister has CAD. Mother had an MI at age 48. Still alive at 55.  ? Fatty liver   ? Female infertility   ? History of mononucleosis 04/21/2013  ? History of sinus surgery 04/21/2013  ? Hypertriglyceridemia 04/21/2013  ? Joint pain   ? Morbid obesity with BMI of 40.0-44.9, adult ([place])   ? BMI roughly 43 noted on 02/09/2016 admission  [name])  ? 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 Obesity   ? Osteoarthritis   ? Palpitations   ? Prediabetes   ? Pseudotumor cerebri 08/2017  ? Idiopathic intracerebral hypertension -> s/p right transvers

## 2022-02-26 NOTE — Assessment & Plan Note (Addendum)
Chest pain is clearly costochondral in nature.  Worse with palpation.  Worse with deep inspiration and cough.  Worse with certain movements of the upper body.  Improved with Lidoderm patch as well as Midol. ? ?Plan: ?? Based on how long she has been having symptoms, we need to be very aggressive with treatment.  More prolonged course. ?? We will start with steroid taper: 60 mg daily x3 days, 40 mg daily x3 days, 20 mg daily x2 days and then 10 mg daily x2 days. ?? When initiating the 10 mg dosage initiate ibuprofen taper: 800 mg 3 times daily x3 days, 6 mg 3 times daily x3 days, 400 mg 3 times daily x3 days then 200 mg 3 times daily times 3 days ?? Ensure that she drinks plenty of water (60-70 ounces of water a day) while on ibuprofen.  Also ensure that she is eating something while taking ibuprofen. ?

## 2022-02-26 NOTE — Assessment & Plan Note (Signed)
Unfortunately, her weight is up and down.  Lots of issues with her weight that all started with her cerebrospinal fluid issues. ? ?=> I do think that a GLP-1 agonist such as Mounjaro would be a good idea. ?

## 2022-03-06 ENCOUNTER — Other Ambulatory Visit (INDEPENDENT_AMBULATORY_CARE_PROVIDER_SITE_OTHER): Payer: Self-pay | Admitting: Bariatrics

## 2022-03-06 ENCOUNTER — Encounter (INDEPENDENT_AMBULATORY_CARE_PROVIDER_SITE_OTHER): Payer: Self-pay | Admitting: Bariatrics

## 2022-03-06 ENCOUNTER — Ambulatory Visit (INDEPENDENT_AMBULATORY_CARE_PROVIDER_SITE_OTHER): Payer: BC Managed Care – PPO | Admitting: Bariatrics

## 2022-03-06 VITALS — BP 158/98 | HR 72 | Temp 97.9°F | Ht 66.0 in | Wt 274.0 lb

## 2022-03-06 DIAGNOSIS — I10 Essential (primary) hypertension: Secondary | ICD-10-CM | POA: Insufficient documentation

## 2022-03-06 DIAGNOSIS — R7303 Prediabetes: Secondary | ICD-10-CM

## 2022-03-06 DIAGNOSIS — R030 Elevated blood-pressure reading, without diagnosis of hypertension: Secondary | ICD-10-CM

## 2022-03-06 DIAGNOSIS — E782 Mixed hyperlipidemia: Secondary | ICD-10-CM | POA: Diagnosis not present

## 2022-03-06 DIAGNOSIS — E669 Obesity, unspecified: Secondary | ICD-10-CM

## 2022-03-06 DIAGNOSIS — R632 Polyphagia: Secondary | ICD-10-CM

## 2022-03-06 DIAGNOSIS — E559 Vitamin D deficiency, unspecified: Secondary | ICD-10-CM

## 2022-03-06 DIAGNOSIS — Z6841 Body Mass Index (BMI) 40.0 and over, adult: Secondary | ICD-10-CM

## 2022-03-06 MED ORDER — VITAMIN D (ERGOCALCIFEROL) 1.25 MG (50000 UNIT) PO CAPS: 50000.0000 [IU] | ORAL_CAPSULE | ORAL | 0 refills | Status: DC

## 2022-03-06 MED ORDER — SAXENDA 18 MG/3ML ~~LOC~~ SOPN: 0.6000 mg | PEN_INJECTOR | Freq: Every day | SUBCUTANEOUS | 0 refills | Status: DC

## 2022-03-06 MED ORDER — INSULIN PEN NEEDLE 33G X 5 MM MISC: 0 refills | Status: DC

## 2022-03-06 MED ORDER — LISINOPRIL 20 MG PO TABS: 20.0000 mg | ORAL_TABLET | Freq: Every day | ORAL | 0 refills | Status: DC

## 2022-03-15 NOTE — Progress Notes (Addendum)
? ? ? ?Chief Complaint:  ? ?OBESITY ?Morgan Maddox is here to discuss her progress with her obesity treatment plan along with follow-up of her obesity related diagnoses. Morgan Maddox is on the Category 2 Plan and states she is following her eating plan approximately 0% of the time. Morgan Maddox states she is not currently exercising. ? ?Today's visit was #: 2 ?Starting weight: 272 lbs ?Starting date: 02/20/2022 ?Today's weight: 274 lbs ?Today's date: 03/06/2022 ?Total lbs lost to date: 0 lbs ? ?Total lbs lost since last in-office visit: 0  ? ?Interim History: Morgan Maddox is down 2 lbs since her first visit. She has on Prednisone and Ibuprofen for Costochondritis. ? ?Subjective:  ? ?1. Vitamin D insufficiency ?Morgan Maddox's Vitamin D level is 31.1. She is not currently taking Vitamin D. ? ?2. Prediabetes ?Morgan Maddox is not currently taking any medication. Her A1c level is 6.0. ? ?3. Mixed hyperlipidemia ?Morgan Maddox is not currently taking any medication. Total Cholesterol is 216, LDL is 118, HDL is 80, and ASCVD is 4.9%. ? ?4. Essential hypertension, new diagnosis ?Morgan Maddox is not currently taking any medication. ? ?5. Polyphagia ?Morgan Maddox is not currently taking any medication. ? ?Assessment/Plan:  ? ?1. Vitamin D insufficiency ?Low Vitamin D level contributes to fatigue and are associated with obesity, breast, and colon cancer. She agrees to continue to take prescription Vitamin D '@50'$ ,000 IU every week and will follow-up for routine testing of Vitamin D, at least 2-3 times per year to avoid over-replacement. ?- Vitamin D, Ergocalciferol, (DRISDOL) 1.25 MG (50000 UNIT) CAPS capsule; Take 1 capsule (50,000 Units total) by mouth every 7 (seven) days.  Dispense: 5 capsule; Refill: 0 ? ?2. Prediabetes ?Morgan Maddox will cut carbohydrates (sweets & starches). She will start taking Saxenda 0.6 mg into the skin daily. ? ?3. Mixed hyperlipidemia ?Cardiovascular risk and specific lipid/LDL goals reviewed.  We discussed several lifestyle modifications today and Morgan Maddox will continue  to work on diet, exercise and weight loss efforts. Orders and follow up as documented in patient record.  ? ?Counseling ?Intensive lifestyle modifications are the first line treatment for this issue. ?Dietary changes: Increase soluble fiber. Decrease simple carbohydrates. ?Exercise changes: Moderate to vigorous-intensity aerobic activity 150 minutes per week if tolerated. ?Lipid-lowering medications: see documented in medical record. ?4 Essential hypertension,new diagnosis ?Will prescribe Lisinopril 20 mg daily. ?- lisinopril (ZESTRIL) 20 MG tablet; Take 1 tablet (20 mg total) by mouth daily.  Dispense: 90 tablet; Refill: 0 ? ?5.  Polyphagia ?Will prescribe Saxenda 0.6 mg into the skin daily. ?- Liraglutide -Weight Management (SAXENDA) 18 MG/3ML SOPN; Inject 0.6 mg into the skin daily.  Dispense: 15 mL; Refill: 0 ?- Insulin Pen Needle 33G X 5 MM MISC; Use with Saxenda daily  Dispense: 50 each; Refill: 0 ? ?6.  Obesity, Current BMI 44.3 ?Morgan Maddox is currently in the action stage of change. As such, her goal is to continue with weight loss efforts. She has agreed to the Category 2 Plan.  ? ?Morgan Maddox agreed to meal planning. ?Reviewed labs from 02/20/2022 with Morgan Maddox (CMP, Lipids, Vit D, Hgb A1, Insulin, and Thyroid panel). ? ?Exercise goals: As is. ? ?Behavioral modification strategies: increasing lean protein intake, decreasing simple carbohydrates, increasing vegetables, increasing water intake, decreasing eating out, no skipping meals, meal planning and cooking strategies, keeping healthy foods in the home, and planning for success. ? ?Morgan Maddox has agreed to follow-up with our clinic in 2-3 weeks. She was informed of the importance of frequent follow-up visits to maximize her success with intensive lifestyle modifications for her  multiple health conditions.  ? ?Morgan Maddox was informed we would discuss her lab results at her next visit unless there is a critical issue that needs to be addressed sooner. Morgan Maddox agreed to keep her next  visit at the agreed upon time to discuss these results. ? ?Objective:  ? ?Blood pressure (!) 158/98, pulse 72, temperature 97.9 ?F (36.6 ?C), height '5\' 6"'$  (1.676 m), weight 274 lb (124.3 kg), SpO2 98 %. ?Body mass index is 44.22 kg/m?. ? ?General: Cooperative, alert, well developed, in no acute distress. ?HEENT: Conjunctivae and lids unremarkable. ?Cardiovascular: Regular rhythm.  ?Lungs: Normal work of breathing. ?Neurologic: No focal deficits.  ? ?Lab Results  ?Component Value Date  ? CREATININE 0.62 02/20/2022  ? BUN 14 02/20/2022  ? NA 141 02/20/2022  ? K 4.9 02/20/2022  ? CL 101 02/20/2022  ? CO2 25 02/20/2022  ? ?Lab Results  ?Component Value Date  ? ALT 14 02/20/2022  ? AST 12 02/20/2022  ? ALKPHOS 99 02/20/2022  ? BILITOT 0.3 02/20/2022  ? ?Lab Results  ?Component Value Date  ? HGBA1C 6.0 (H) 02/20/2022  ? HGBA1C 5.8 (H) 01/31/2021  ? HGBA1C 5.8 (H) 10/11/2020  ? HGBA1C 5.6 07/15/2018  ? HGBA1C 5.8 (H) 10/08/2016  ? ?Lab Results  ?Component Value Date  ? INSULIN 12.4 02/20/2022  ? ?Lab Results  ?Component Value Date  ? TSH 1.400 02/20/2022  ? ?Lab Results  ?Component Value Date  ? CHOL 216 (H) 02/20/2022  ? HDL 80 02/20/2022  ? LDLCALC 118 (H) 02/20/2022  ? TRIG 106 02/20/2022  ? CHOLHDL 2.8 10/10/2021  ? ?Lab Results  ?Component Value Date  ? VD25OH 31.1 02/20/2022  ? VD25OH 34 10/10/2021  ? VD25OH 30 10/11/2020  ? ?Lab Results  ?Component Value Date  ? WBC 10.3 11/30/2021  ? HGB 13.2 11/30/2021  ? HCT 40.4 11/30/2021  ? MCV 87.4 11/30/2021  ? PLT 367 11/30/2021  ? ?Lab Results  ?Component Value Date  ? IRON 116 03/23/2015  ? TIBC 419 03/23/2015  ? ? ?Attestation Statements:  ? ?Reviewed by clinician on day of visit: allergies, medications, problem list, medical history, surgical history, family history, social history, and previous encounter notes. ? ?I, Lennette Bihari, am acting as Location manager for [employer], DO. ? ?I have reviewed the above documentation for accuracy and completeness, and I agree  with the above. Jearld Lesch, DO ? ? ?

## 2022-03-17 ENCOUNTER — Encounter (INDEPENDENT_AMBULATORY_CARE_PROVIDER_SITE_OTHER): Payer: Self-pay | Admitting: Bariatrics

## 2022-03-27 ENCOUNTER — Telehealth (INDEPENDENT_AMBULATORY_CARE_PROVIDER_SITE_OTHER): Payer: BC Managed Care – PPO | Admitting: Family Medicine

## 2022-04-17 ENCOUNTER — Ambulatory Visit (INDEPENDENT_AMBULATORY_CARE_PROVIDER_SITE_OTHER): Payer: BC Managed Care – PPO | Admitting: Bariatrics

## 2022-04-17 ENCOUNTER — Encounter (INDEPENDENT_AMBULATORY_CARE_PROVIDER_SITE_OTHER): Payer: Self-pay | Admitting: Bariatrics

## 2022-04-17 ENCOUNTER — Telehealth (INDEPENDENT_AMBULATORY_CARE_PROVIDER_SITE_OTHER): Payer: Self-pay

## 2022-04-17 VITALS — BP 129/82 | HR 69 | Temp 97.4°F | Ht 66.0 in | Wt 274.0 lb

## 2022-04-17 DIAGNOSIS — E559 Vitamin D deficiency, unspecified: Secondary | ICD-10-CM

## 2022-04-17 DIAGNOSIS — I10 Essential (primary) hypertension: Secondary | ICD-10-CM | POA: Diagnosis not present

## 2022-04-17 DIAGNOSIS — E669 Obesity, unspecified: Secondary | ICD-10-CM | POA: Diagnosis not present

## 2022-04-17 DIAGNOSIS — Z6841 Body Mass Index (BMI) 40.0 and over, adult: Secondary | ICD-10-CM

## 2022-04-17 MED ORDER — LISINOPRIL 20 MG PO TABS: 10.0000 mg | ORAL_TABLET | Freq: Every day | ORAL | 0 refills | Status: DC

## 2022-04-17 MED ORDER — VITAMIN D (ERGOCALCIFEROL) 1.25 MG (50000 UNIT) PO CAPS: 50000.0000 [IU] | ORAL_CAPSULE | ORAL | 0 refills | Status: DC

## 2022-04-17 NOTE — Telephone Encounter (Signed)
Patient has never started it due to her needing a prior auth. Please and thank you.

## 2022-04-17 NOTE — Telephone Encounter (Signed)
Patient stated that she needed a prior auth for Saxenda. Can you please advise?

## 2022-04-17 NOTE — Telephone Encounter (Signed)
Okay thank you

## 2022-04-17 NOTE — Telephone Encounter (Signed)
On patient medication for Saxenda it shows that patient reported not taking Saxenda as 04/17/22. Please let me if, patient is taking medication, then I will start prior authorization. Thanks!

## 2022-04-17 NOTE — Telephone Encounter (Signed)
Prior authorization started for Saxenda. Will notify patient and provider once response is received.

## 2022-04-18 NOTE — Progress Notes (Signed)
Chief Complaint:   OBESITY Morgan Maddox is here to discuss her progress with her obesity treatment plan along with follow-up of her obesity related diagnoses. Morgan Maddox is on the Category 2 Plan and states she is following her eating plan approximately 0% of the time. Morgan Maddox states she is doing 0 minutes 0 times per week.  Today's visit was #: 3 Starting weight: 272 lbs Starting date: 02/20/2022 Today's weight: 274 lbs Today's date: 04/17/2022 Total lbs lost to date: 0 Total lbs lost since last in-office visit: 0  Interim History: Morgan Maddox is not drinking shakes due to milk.   Subjective:   1. Essential hypertension, new diagnosis Morgan Maddox's blood pressure is controlled. Her blood pressure today is 129/82. She is currently taking Lisinopril 20 mg 1/2 tablet.   2. Vitamin D insufficiency Morgan Maddox is currently taking prescription vitamin D 50,000 IU each week. She denies nausea, vomiting or muscle weakness.  Assessment/Plan:   1. Essential hypertension, new diagnosis Morgan Maddox will continue medications. She is working on healthy weight loss and exercise to improve blood pressure control. We will watch for signs of hypotension as she continues her lifestyle modifications.  - lisinopril (ZESTRIL) 20 MG tablet; Take 0.5 tablets (10 mg total) by mouth daily.  Dispense: 90 tablet; Refill: 0  2. Vitamin D insufficiency Low Vitamin D level contributes to fatigue and are associated with obesity, breast, and colon cancer. We will refill prescription Vitamin D 50,000 IU every week for 1 month with no refills and Morgan Maddox will follow-up for routine testing of Vitamin D, at least 2-3 times per year to avoid over-replacement.  - Vitamin D, Ergocalciferol, (DRISDOL) 1.25 MG (50000 UNIT) CAPS capsule; Take 1 capsule (50,000 Units total) by mouth every 7 (seven) days.  Dispense: 12 capsule; Refill: 0  3. Obesity, Current BMI 44.3 Morgan Maddox is currently in the action stage of change. As such, her goal is to continue with  weight loss efforts. She has agreed to the Category 2 Plan.   Morgan Maddox will continue meal planning and she will be mindful eating. Prepared Breakfast Options and Dining Out Guide was provided today.  Exercise goals:  Morgan Maddox will start walking.  Behavioral modification strategies: increasing lean protein intake, decreasing simple carbohydrates, increasing vegetables, increasing water intake, decreasing eating out, no skipping meals, meal planning and cooking strategies, keeping healthy foods in the home, and planning for success.  Morgan Maddox has agreed to follow-up with our clinic in 3 weeks. She was informed of the importance of frequent follow-up visits to maximize her success with intensive lifestyle modifications for her multiple health conditions.   Objective:   Blood pressure 129/82, pulse 69, temperature (!) 97.4 F (36.3 C), height '5\' 6"'$  (1.676 m), weight 274 lb (124.3 kg), SpO2 97 %. Body mass index is 44.22 kg/m.  General: Cooperative, alert, well developed, in no acute distress. HEENT: Conjunctivae and lids unremarkable. Cardiovascular: Regular rhythm.  Lungs: Normal work of breathing. Neurologic: No focal deficits.   Lab Results  Component Value Date   CREATININE 0.62 02/20/2022   BUN 14 02/20/2022   NA 141 02/20/2022   K 4.9 02/20/2022   CL 101 02/20/2022   CO2 25 02/20/2022   Lab Results  Component Value Date   ALT 14 02/20/2022   AST 12 02/20/2022   ALKPHOS 99 02/20/2022   BILITOT 0.3 02/20/2022   Lab Results  Component Value Date   HGBA1C 6.0 (H) 02/20/2022   HGBA1C 5.8 (H) 01/31/2021   HGBA1C 5.8 (H) 10/11/2020  HGBA1C 5.6 07/15/2018   HGBA1C 5.8 (H) 10/08/2016   Lab Results  Component Value Date   INSULIN 12.4 02/20/2022   Lab Results  Component Value Date   TSH 1.400 02/20/2022   Lab Results  Component Value Date   CHOL 216 (H) 02/20/2022   HDL 80 02/20/2022   LDLCALC 118 (H) 02/20/2022   TRIG 106 02/20/2022   CHOLHDL 2.8 10/10/2021   Lab  Results  Component Value Date   VD25OH 31.1 02/20/2022   VD25OH 34 10/10/2021   VD25OH 30 10/11/2020   Lab Results  Component Value Date   WBC 10.3 11/30/2021   HGB 13.2 11/30/2021   HCT 40.4 11/30/2021   MCV 87.4 11/30/2021   PLT 367 11/30/2021   Lab Results  Component Value Date   IRON 116 03/23/2015   TIBC 419 03/23/2015   Attestation Statements:   Reviewed by clinician on day of visit: allergies, medications, problem list, medical history, surgical history, family history, social history, and previous encounter notes.  I, Lizbeth Bark, RMA, am acting as Location manager for [employer], DO.  I have reviewed the above documentation for accuracy and completeness, and I agree with the above. Jearld Lesch, DO

## 2022-04-20 IMAGING — US US EXTREM LOW VENOUS
1 series · 13 of 24 positions shown · non-contrast
Comparison: None.

No prior.

CLINICAL DATA: Bilateral lower extremity swelling.



[Series 1: us extrem low venous · 0.10mm/px · 13 of 69 slices shown]
[im 1/69]
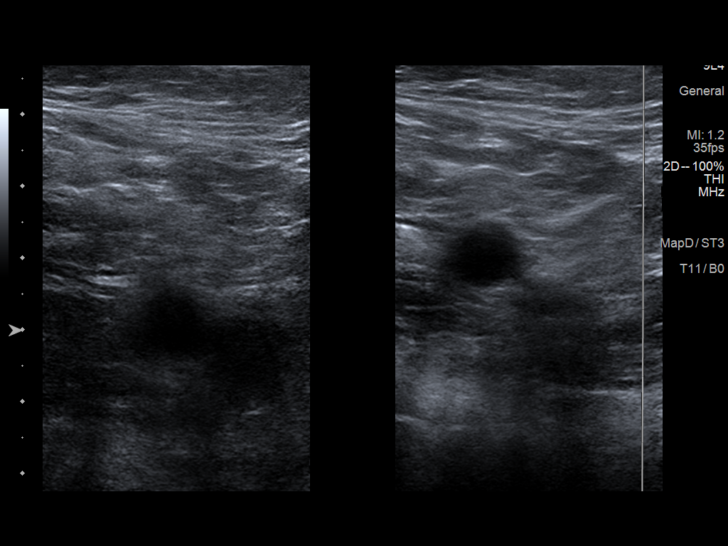
[im 6/69]
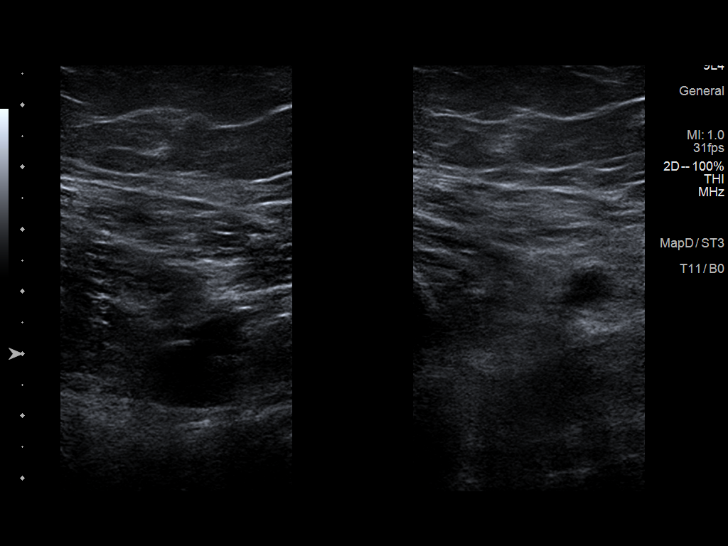
[im 12/69]
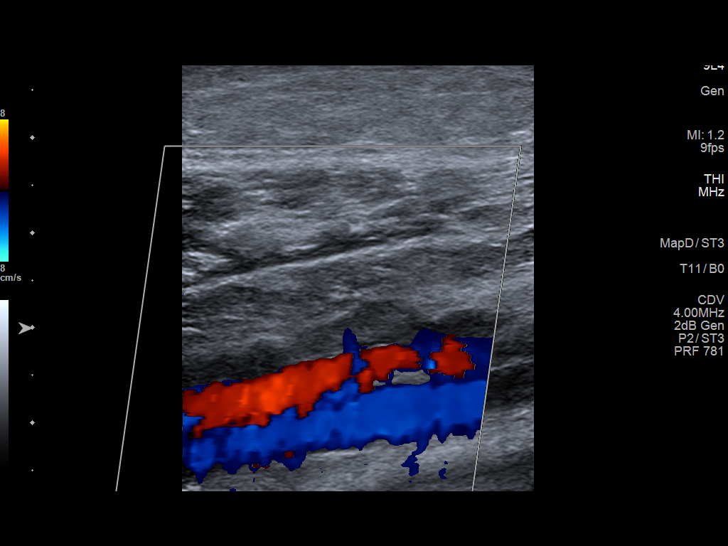
[im 18/69]
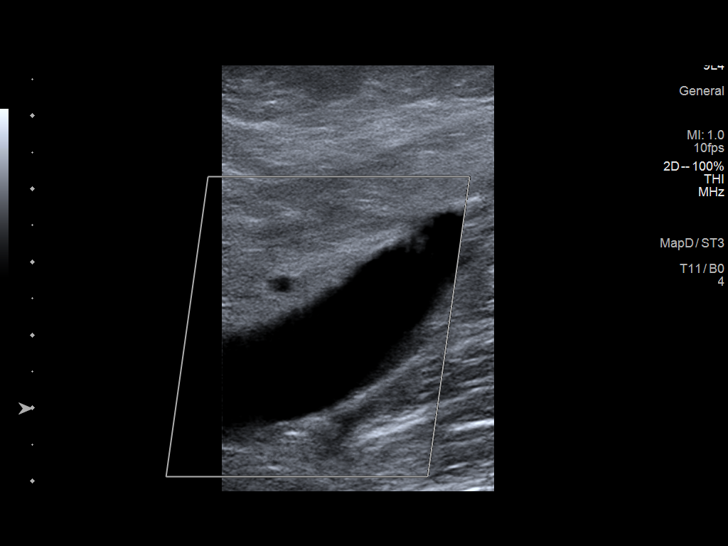
[im 24/69]
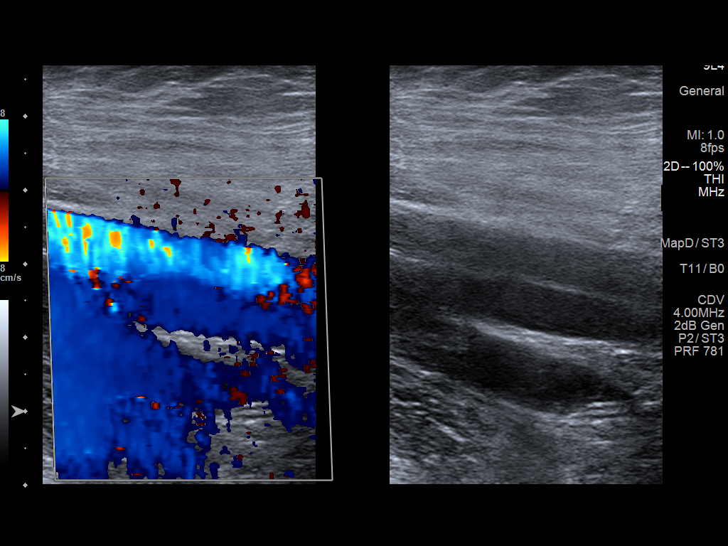
[im 30/69]
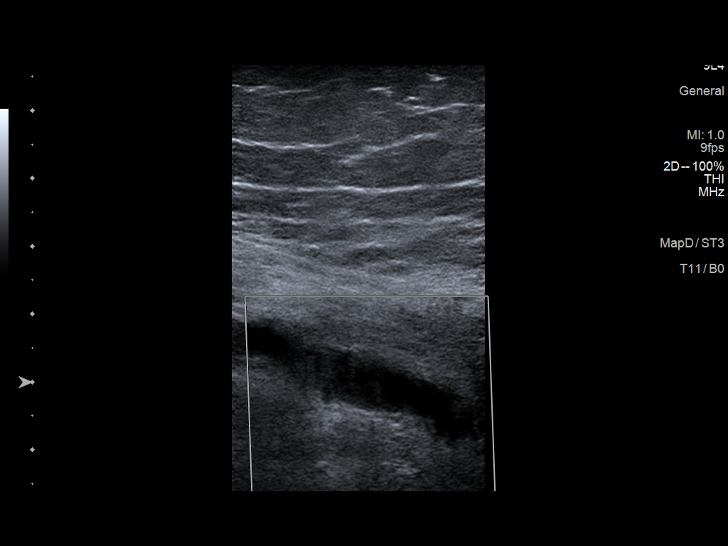
[im 36/69]
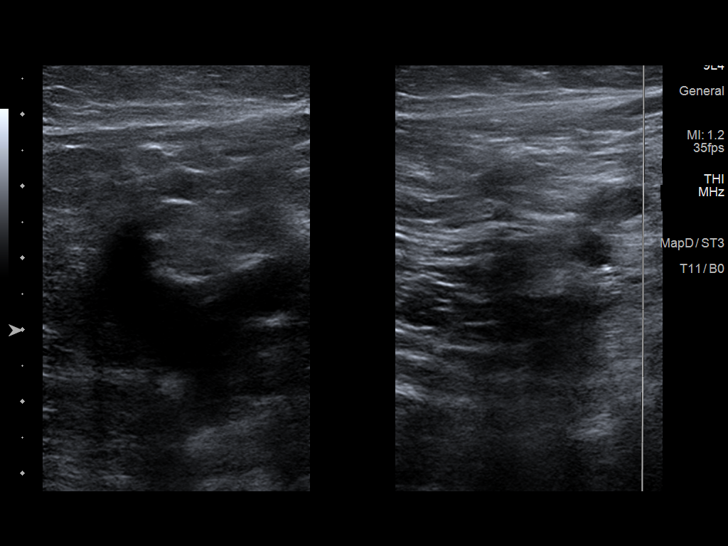
[im 39/69]
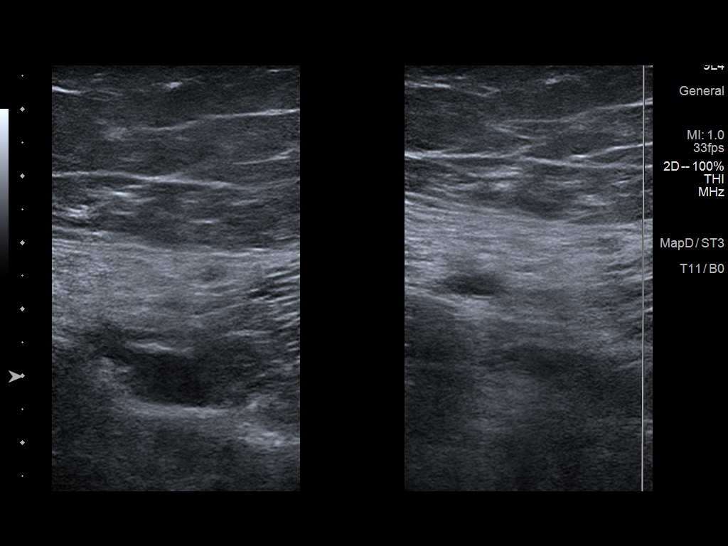
[im 45/69]
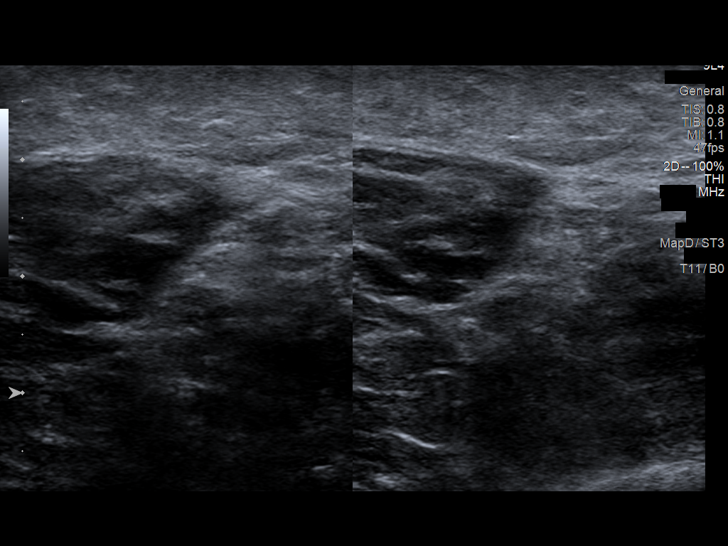
[im 51/69]
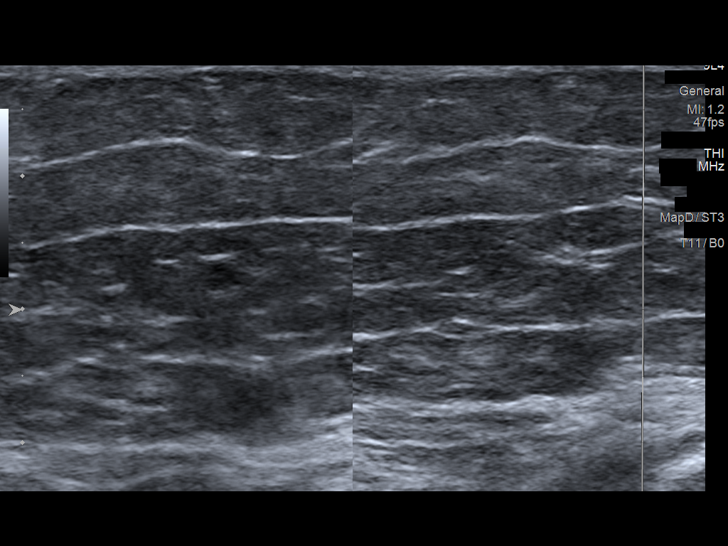
[im 57/69]
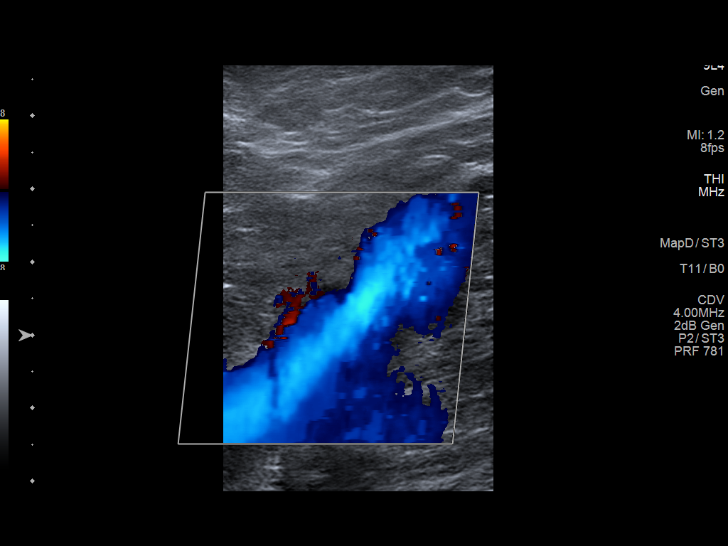
[im 63/69]
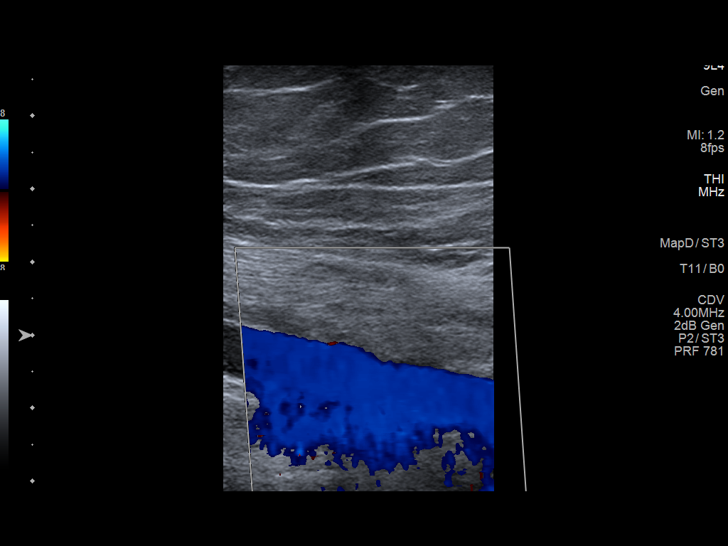
[im 69/69]
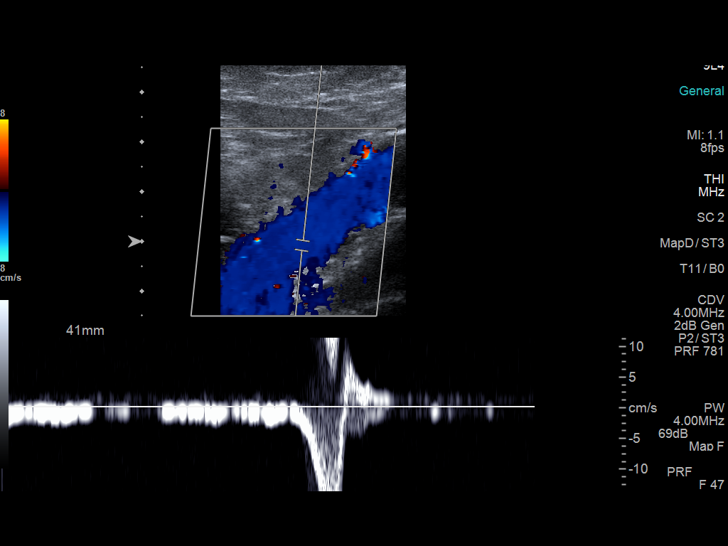

[13 of 24 positions shown; findings below may reference images not displayed]

FINDINGS: RIGHT LOWER EXTREMITY

Common Femoral Vein: No evidence of thrombus. Normal
compressibility, respiratory phasicity and response to augmentation.

Saphenofemoral Junction: No evidence of thrombus. Normal
compressibility and flow on color Doppler imaging.

Profunda Femoral Vein: No evidence of thrombus. Normal
compressibility and flow on color Doppler imaging.

Femoral Vein: No evidence of thrombus. Normal compressibility,
respiratory phasicity and response to augmentation.

Popliteal Vein: No evidence of thrombus. Normal compressibility,
respiratory phasicity and response to augmentation.

Calf Veins: Thrombus noted in the right posterior tibial vein. Right
peroneal veins poorly visualized.

Superficial Great Saphenous Vein: Thrombus noted in the right
greater saphenous vein.

Other Findings:  None.

LEFT LOWER EXTREMITY

Common Femoral Vein: No evidence of thrombus. Normal
compressibility, respiratory phasicity and response to augmentation.

Saphenofemoral Junction: No evidence of thrombus. Normal
compressibility and flow on color Doppler imaging.

Profunda Femoral Vein: No evidence of thrombus. Normal
compressibility and flow on color Doppler imaging.

Femoral Vein: No evidence of thrombus. Normal compressibility,
respiratory phasicity and response to augmentation.

Popliteal Vein: No evidence of thrombus. Normal compressibility,
respiratory phasicity and response to augmentation.

Calf Veins: No evidence of thrombus. Left peroneal veins poorly
visualized. Normal compressibility and flow on color Doppler
imaging.

Superficial Great Saphenous Vein: No evidence of thrombus. Normal
compressibility.

Other Findings:  None.
IMPRESSION: 1. DVT noted in the right posterior tibial vein. Right peroneal
veins poorly visualized.

2. Superficial venous thrombus noted in the right greater saphenous
vein.

3.  Left lower extremity unremarkable.

## 2022-04-23 ENCOUNTER — Encounter (INDEPENDENT_AMBULATORY_CARE_PROVIDER_SITE_OTHER): Payer: Self-pay | Admitting: Bariatrics

## 2022-04-26 ENCOUNTER — Encounter (INDEPENDENT_AMBULATORY_CARE_PROVIDER_SITE_OTHER): Payer: Self-pay

## 2022-04-26 ENCOUNTER — Telehealth (INDEPENDENT_AMBULATORY_CARE_PROVIDER_SITE_OTHER): Payer: Self-pay | Admitting: Bariatrics

## 2022-04-26 NOTE — Telephone Encounter (Signed)
I called patient pharmacy insurance [employer] to find out status of prior authorization for Saxenda. Prior authorization was started on 04/17/22. Per insurance it is still being processed and it can take up to 15 business days to get a response. Call reference - Latanya Presser 04/26/22 @ 3:24pm. Patient sent update of prior authorization status via mychart.

## 2022-04-30 ENCOUNTER — Encounter: Payer: Self-pay | Admitting: Cardiology

## 2022-05-01 ENCOUNTER — Encounter (INDEPENDENT_AMBULATORY_CARE_PROVIDER_SITE_OTHER): Payer: Self-pay

## 2022-05-01 ENCOUNTER — Telehealth (INDEPENDENT_AMBULATORY_CARE_PROVIDER_SITE_OTHER): Payer: Self-pay | Admitting: Bariatrics

## 2022-05-01 MED ORDER — IBUPROFEN 200 MG PO CAPS: ORAL_CAPSULE | ORAL | 0 refills | Status: AC

## 2022-05-01 MED ORDER — PREDNISONE 10 MG PO TABS: ORAL_TABLET | ORAL | 0 refills | Status: AC

## 2022-05-01 NOTE — Telephone Encounter (Signed)
Dr .Owens Shark - Prior authorization approved for Saxenda. Effective: 04/30/2022 - 08/30/2022. Patient sent approval message via mychart.

## 2022-05-01 NOTE — Telephone Encounter (Signed)
Per Dr Ellyn Hack,  the patient can hac another round of prednisone taper medication  for  her discomofrt.   Rn sen message to patient  and prescription  to pharmacy

## 2022-05-02 ENCOUNTER — Ambulatory Visit: Payer: BC Managed Care – PPO | Admitting: Cardiology

## 2022-05-03 ENCOUNTER — Encounter (INDEPENDENT_AMBULATORY_CARE_PROVIDER_SITE_OTHER): Payer: Self-pay

## 2022-05-03 ENCOUNTER — Ambulatory Visit (INDEPENDENT_AMBULATORY_CARE_PROVIDER_SITE_OTHER): Payer: BC Managed Care – PPO | Admitting: Bariatrics

## 2022-05-08 ENCOUNTER — Ambulatory Visit (INDEPENDENT_AMBULATORY_CARE_PROVIDER_SITE_OTHER): Payer: BC Managed Care – PPO | Admitting: Bariatrics

## 2022-05-17 ENCOUNTER — Other Ambulatory Visit: Payer: Self-pay | Admitting: Family Medicine

## 2022-05-17 DIAGNOSIS — Z1231 Encounter for screening mammogram for malignant neoplasm of breast: Secondary | ICD-10-CM

## 2022-06-01 ENCOUNTER — Encounter: Payer: Self-pay | Admitting: Cardiology

## 2022-06-13 NOTE — Telephone Encounter (Signed)
Paper work done

## 2022-06-20 ENCOUNTER — Encounter (INDEPENDENT_AMBULATORY_CARE_PROVIDER_SITE_OTHER): Payer: Self-pay

## 2022-06-22 NOTE — Telephone Encounter (Signed)
Form faxed 06/15/22

## 2022-07-21 ENCOUNTER — Other Ambulatory Visit (INDEPENDENT_AMBULATORY_CARE_PROVIDER_SITE_OTHER): Payer: Self-pay | Admitting: Bariatrics

## 2022-07-21 DIAGNOSIS — E559 Vitamin D deficiency, unspecified: Secondary | ICD-10-CM

## 2022-08-14 ENCOUNTER — Ambulatory Visit
Admission: RE | Admit: 2022-08-14 | Discharge: 2022-08-14 | Disposition: A | Discharge status: Home / Self Care | Payer: BC Managed Care – PPO | Source: Ambulatory Visit | Attending: Cardiology | Admitting: Cardiology

## 2022-08-14 ENCOUNTER — Encounter: Payer: Self-pay | Admitting: Cardiology

## 2022-08-14 ENCOUNTER — Ambulatory Visit: Payer: BC Managed Care – PPO | Admitting: Cardiology

## 2022-08-14 VITALS — BP 142/82 | HR 83 | Ht 66.0 in | Wt 283.0 lb

## 2022-08-14 DIAGNOSIS — I10 Essential (primary) hypertension: Secondary | ICD-10-CM

## 2022-08-14 DIAGNOSIS — I82441 Acute embolism and thrombosis of right tibial vein: Secondary | ICD-10-CM | POA: Insufficient documentation

## 2022-08-14 DIAGNOSIS — M940 Chondrocostal junction syndrome [Tietze]: Secondary | ICD-10-CM | POA: Diagnosis not present

## 2022-08-14 DIAGNOSIS — E782 Mixed hyperlipidemia: Secondary | ICD-10-CM

## 2022-08-14 DIAGNOSIS — R600 Localized edema: Secondary | ICD-10-CM

## 2022-08-14 MED ORDER — VALSARTAN 160 MG PO TABS: 160.0000 mg | ORAL_TABLET | Freq: Every day | ORAL | 3 refills | Status: DC

## 2022-08-14 NOTE — Patient Instructions (Addendum)
Medication Instructions:   Stop Lisinopril   Start taking Valsartan 160 mg - for the first week take 1/2 tablet daily then increase to a full tablet daily   *If you need a refill on your cardiac medications before your next appointment, please call your pharmacy*   Lab Work: [place] LPA  If you have labs (blood work) drawn today and your tests are completely normal, you will receive your results only by: [place] (if you have MyChart) OR A paper copy in the mail If you have any lab test that is abnormal or we need to change your treatment, we will call you to review the results.   Testing/Procedures:  CT coronary calcium score.   Test locations:  [address]  This is $99 out of pocket.   Coronary CalciumScan A coronary calcium scan is an imaging test used to look for deposits of calcium and other fatty materials (plaques) in the inner lining of the blood vessels of the heart (coronary arteries). These deposits of calcium and plaques can partly clog and narrow the coronary arteries without producing any symptoms or warning signs. This puts a person at risk for a heart attack. This test can detect these deposits before symptoms develop. Tell a health care provider about: Any allergies you have. All medicines you are taking, including vitamins, herbs, eye drops, creams, and over-the-counter medicines. Any problems you or family members have had with anesthetic medicines. Any blood disorders you have. Any surgeries you have had. Any medical conditions you have. Whether you are pregnant or may be pregnant. What are the risks? Generally, this is a safe procedure. However, problems may occur, including: Harm to a pregnant woman and her unborn baby. This test involves the use of radiation. Radiation exposure can be dangerous to a pregnant woman and her unborn baby. If you are pregnant, you generally should not have  this procedure done. Slight increase in the risk of cancer. This is because of the radiation involved in the test. What happens before the procedure? No preparation is needed for this procedure. What happens during the procedure? You will undress and remove any jewelry around your neck or chest. You will put on a hospital gown. Sticky electrodes will be placed on your chest. The electrodes will be connected to an electrocardiogram (ECG) machine to record a tracing of the electrical activity of your heart. A CT scanner will take pictures of your heart. During this time, you will be asked to lie still and hold your breath for 2-3 seconds while a picture of your heart is being taken. The procedure may vary among health care providers and hospitals. What happens after the procedure? You can get dressed. You can return to your normal activities. It is up to you to get the results of your test. Ask your health care provider, or the department that is doing the test, when your results will be ready. Summary A coronary calcium scan is an imaging test used to look for deposits of calcium and other fatty materials (plaques) in the inner lining of the blood vessels of the heart (coronary arteries). Generally, this is a safe procedure. Tell your health care provider if you are pregnant or may be pregnant. No preparation is needed for this procedure. A CT scanner will take pictures of your heart. You can return to your normal activities after the scan is done. This information is not intended to replace advice given to  you by your health care provider. Make sure you discuss any questions you have with your health care provider. Document Released: 04/26/2008 Document Revised: 09/17/2016 Document Reviewed: 09/17/2016 Elsevier Interactive Patient Education  2017 [place]: At [facility], you and your health needs are our priority.  As part of our continuing mission to provide you with  exceptional heart care, we have created designated Provider Care Teams.  These Care Teams include your primary Cardiologist (physician) and Advanced Practice Providers (APPs -  Physician Assistants and Nurse Practitioners) who all work together to provide you with the care you need, when you need it.     Your next appointment:   3 month(s)  The format for your next appointment:   In Person  Provider:   Glenetta Hew, MD

## 2022-08-14 NOTE — Progress Notes (Signed)
Primary Care Provider: Luetta Nutting, [place] Cardiologist: Glenetta Hew, MD Electrophysiologist: None  Clinic Note: Chief Complaint  Patient presents with   Follow-up    Having more of the upper chest pain.  Also notes cough   ===================================  ASSESSMENT/PLAN   Problem List Items Addressed This Visit       Cardiology Problems   Deep vein thrombosis (DVT) of tibial vein of right lower extremity (HCC) (Chronic)    Completed short DOAC course.  Stable now.      Relevant Medications   valsartan (DIOVAN) 160 MG tablet   Other Relevant Orders   Lipid panel (Completed)   Comprehensive metabolic panel (Completed)   Lipoprotein A (LPA) (Completed)   Hemoglobin A1c (Completed)   Essential hypertension (Chronic)    Blood pressure is little high today.  She is somewhat anxious.  Also little concerned that she is having cough.  Plan: DC lisinopril and convert to ARB-valsartan 160 mg starting off with 1/2 tablet daily. Check chemistry panel along with lipids      Relevant Medications   valsartan (DIOVAN) 160 MG tablet   Other Relevant Orders   Lipid panel (Completed)   Comprehensive metabolic panel (Completed)   Lipoprotein A (LPA) (Completed)   Hemoglobin A1c (Completed)   CT CARDIAC SCORING (SELF PAY ONLY) (Completed)   Mixed hyperlipidemia - Primary (Chronic)    Last labs were checked in April.  LDL was 118 and triglycerides 106.  She is obese.  Does not have a diagnosis of diabetes with an A1c of 6.0, but was started on GLP-1 agonist (Saxenda) however she never started it because she did not get the prescription.  She is not on statin.  Risk factors include: Blood pressure, obesity and basically sedentary lifestyle with former smoking ->  for risk stratification we will check a Coronary Calcium Score in order to determine if we need to be more aggressive treating lipids. Recheck lipid panel as well as LP(a) Recheck A1c       Relevant Medications   valsartan (DIOVAN) 160 MG tablet   Other Relevant Orders   Lipid panel (Completed)   Comprehensive metabolic panel (Completed)   Lipoprotein A (LPA) (Completed)   Hemoglobin A1c (Completed)   CT CARDIAC SCORING (SELF PAY ONLY) (Completed)     Other   Bilateral leg edema    Elevate feet.  She is taking Lasix 3 to 4 days a week.  Recommend support stockings.  Also recommend getting up and walking-doing exercises.      Costochondritis (Chronic)    Still has reproducible chest pain I think a lot of it has to do with her sleeping on her side and being obese with large breasts.  We talked about the importance of staying active and trying to lose weight.  Trying to avoid sleeping on her side.  Can use warm compresses to help pain.  For risk ratification for cardiac standpoint we are checking Coronary Calcium Score.      Morbid obesity (BMI 43.44)  (Chronic)    Please lose weight.  GLP-1 agonist was probably a good idea.  Unfortunately she never got the prescription filled.  She needs a follow-up with healthy weight and wellness..      Relevant Orders   Lipid panel (Completed)   Comprehensive metabolic panel (Completed)   Lipoprotein A (LPA) (Completed)   Hemoglobin A1c (Completed)   CT CARDIAC SCORING (SELF PAY ONLY) (Completed)   ===================================  HPI:    Morgan Maddox  Morgan Maddox is a 61 y.o. female with a PMH below who presents today for 60-monthfollow-up.  She follows up at the request of MLuetta Nutting DO.  Enedina DConcepcion Gillottwas last seen on February 26, 2022-her BP is very high that day.  She recently downsized to a smaller condo and was in a lot of stress because of having to move.  Also her daughter just got married.  She noted having some discomfort in the center of her chest after unloading several boxes.  Ever since then she is noting intermittent episodes of discomfort most on the right side of her breastbone.  Into the emergency room, ruled  out for MI.  Noting less frequent episodes but still painful.  She noted that once her daughter moved out after the wedding, his stress reduced. Treated costochondritis with prednisone taper with ibuprofen trailer.  Recent Hospitalizations: None  Reviewed  CV studies:    The following studies were reviewed today: (if available, images/films reviewed: From Epic Chart or Care Everywhere) None:  Interval History:   Katisha DTashira Torrereturns for follow-up today stating that she just is bothered by the fact that she still has this diffuse discomfort on the left upper chest.  She said that the steroid taper did help but she still has them off and on and they are bothering her.  Is bothering her today.  She had a really bad spell and we called in another steroid taper for her.  That seemed to help, but she still has off-and-on spells with good days and bad days.  She started just simply get used to it now but is a little bit concerned that she has not had a ischemic evaluation a while.  She says that its been almost 5 years-quit smoking December 5 years ago.  Very happy about that.  Unfortunately, she has gained a lot of weight-9 pounds since last visit.  She has become quite deconditioned and therefore has some exertional dyspnea just from being out of shape. Chest pain is not exertional but it can be made worse with certain movements.  Off-and-on palpitations but nothing prolonged.  No syncope or near syncope.  Some lightheadedness but not to the point of near syncope.  No TIA or amaurosis fugax.  Trivial swelling but no PND or orthopnea.    REVIEWED OF SYSTEMS   Review of Systems  Constitutional:  Positive for malaise/fatigue (Feels "fat and lazy"; quite sedentary-works from home). Negative for weight loss (Gained weight).  HENT:  Negative for congestion.   Respiratory:  Positive for cough (Nagging dry cough, cannot clear her throat.) and shortness of breath (When she walks around now).    Cardiovascular:  Positive for chest pain (Per HPI).  Gastrointestinal:  Negative for blood in stool and melena.  Genitourinary:  Negative for hematuria.  Musculoskeletal:  Positive for back pain. Negative for falls and joint pain.  Neurological:  Positive for dizziness and headaches (Off-and-on "squishy feeling "from her cranial surgery). Negative for focal weakness.  Psychiatric/Behavioral:  Negative for depression (This seems to be under control but the anxiety not so much) and memory loss. The patient is nervous/anxious and has insomnia.    I have reviewed and (if needed) personally updated the patient's problem list, medications, allergies, past medical and surgical history, social and family history.   PAST MEDICAL HISTORY   Past Medical History:  Diagnosis Date   Anxiety    Back pain    Bilateral ovarian cysts  Chest pain    Chronic headache    Constipation    Edema, lower extremity    Family history of premature CAD    Brother had an MI, sister has CAD. Mother had an MI at age 55. Still alive at 75.   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 Periapical defect with partial improvement during rest. No wall motion abnormality noted. Likely related to breast attenuation, cannot exclude small area of periapical  ischemia. --> Cardiac catheterization not recommended. LOW RISK   OVARIAN CYST SURGERY     TONSILLECTOMY     Transverse Sinus Stent  07/2017   [facility] - > for IHH/pseudotumor cerebri    Immunization History  Administered Date(s) Administered   PFIZER(Purple Top)SARS-COV-2 Vaccination 01/29/2020, 02/18/2020   PPD Test 03/23/2015    MEDICATIONS/ALLERGIES   Current Meds  Medication Sig   AMBULATORY NON FORMULARY MEDICATION Medication Name: Compression stockings, please measure to fit, 20-30 mm Hg   aspirin EC 81 MG tablet Take 81 mg by mouth daily. Swallow whole.   betamethasone dipropionate 0.05 % cream Apply topically 2 (two) times daily.   clobetasol (TEMOVATE) 0.05 % external solution Apply 1 application topically as needed.   furosemide (LASIX) 20 MG tablet Take 0.5-1 tablets (10-20 mg total) by mouth daily as needed.   ketoconazole (NIZORAL) 2 % shampoo Apply 1 application. topically as needed.   '[]'$  lisinopril (ZESTRIL) 20 MG tablet Take 0.5 tablets (10 mg total) by mouth daily.    Allergies  Allergen Reactions   Calamine Rash   Pramoxine-Calamine Rash and Other (See Comments)   Macrobid [Nitrofurantoin Macrocrystal] Nausea Only   Versed [Midazolam]    Diphenhydramine Hcl Rash    SOCIAL HISTORY/FAMILY HISTORY   Reviewed in Epic:  Pertinent findings:  Social History   Tobacco Use   Smoking status: Former    Packs/day: 0.50    Years: 25.00    Total pack years: 12.50    Types: Cigarettes   Smokeless tobacco: Never  Vaping Use   Vaping Use: Never used  Substance Use Topics   Alcohol use: Yes    Alcohol/week: 0.0 standard drinks of alcohol    Comment: social   Drug use: No   Social History   Social History Narrative   She works from home. Works 10-12 hour shifts. Significant social stress.   Lives alone. Spent most of her time alone. Does not usually interact personally with other people besides her sister    OBJCTIVE -PE, EKG, labs   Wt Readings from Last  3 Encounters:  08/14/22 283 lb (128.4 kg)  04/17/22 274 lb (124.3 kg)  03/06/22 274 lb (124.3 kg)    Physical Exam: BP (!) 142/82 (BP  Location: Left Arm, Patient Position: Sitting, Cuff Size: Large)   Pulse 83   Ht '5\' 6"'$  (1.676 m)   Wt 283 lb (128.4 kg)   SpO2 97%   BMI 45.68 kg/m  Physical Exam Vitals reviewed.  Constitutional:      General: She is not in acute distress.    Appearance: She is obese. She is not ill-appearing (Well-groomed.  Simply obese) or toxic-appearing.  HENT:     Head: Normocephalic and atraumatic.  Neck:     Vascular: No carotid bruit or JVD (Very difficult to assess).  Cardiovascular:     Rate and Rhythm: Normal rate and regular rhythm. No extrasystoles are present.    Chest Wall: PMI is not displaced (Unable to palpate).     Pulses: Normal pulses.     Heart sounds: S1 normal and S2 normal. Heart sounds are distant. No murmur heard.    No friction rub. No gallop.  Pulmonary:     Effort: Pulmonary effort is normal. No respiratory distress.     Breath sounds: Normal breath sounds. No wheezing or rales.  Chest:     Chest wall: Tenderness (Generalized tenderness from the first the third costosternal joints radiating laterally along the ribs.) present.  Abdominal:     General: Bowel sounds are normal. There is no distension.     Tenderness: There is no abdominal tenderness. There is no guarding.     Comments: Obese.  Unable to assess HSM or bruit  Musculoskeletal:        General: Swelling (Trace to 1+ bilateral ankle and foot swelling) present.     Cervical back: Normal range of motion and neck supple.  Skin:    General: Skin is warm and dry.  Neurological:     General: No focal deficit present.     Mental Status: She is alert and oriented to person, place, and time.     Gait: Gait normal.  Psychiatric:        Thought Content: Thought content normal.     Comments: She is usual seems somewhat anxious and flustered.     Adult ECG Report Not  checked  Recent Labs: No new labs Lab Results  Component Value Date   CHOL 216 (H) 02/20/2022   HDL 80 02/20/2022   LDLCALC 118 (H) 02/20/2022   TRIG 106 02/20/2022   CHOLHDL 2.8 10/10/2021   Lab Results  Component Value Date   CREATININE 0.62 02/20/2022   BUN 14 02/20/2022   NA 141 02/20/2022   K 4.9 02/20/2022   CL 101 02/20/2022   CO2 25 02/20/2022      Latest Ref Rng & Units 11/30/2021   12:28 PM 10/10/2021   12:00 AM 10/11/2020   12:00 AM  CBC  WBC 4.0 - 10.5 K/uL 10.3  6.2  7.1   Hemoglobin 12.0 - 15.0 g/dL 13.2  14.5  14.1   Hematocrit 36.0 - 46.0 % 40.4  43.3  41.9   Platelets 150 - 400 K/uL 367  338  349     Lab Results  Component Value Date   HGBA1C 6.0 (H) 02/20/2022   Lab Results  Component Value Date   TSH 1.400 02/20/2022    ================================================== I spent a total of 28 minutes with the patient spent in direct patient consultation.  Additional time spent with chart review  / charting (studies, outside notes, etc): 16 min Total Time: 44 min  Current medicines are reviewed at length with the patient today.  (+/-  concerns) n/a  Notice: This dictation was prepared with Dragon dictation along with smart phrase technology. Any transcriptional errors that result from this process are unintentional and may not be corrected upon review.  Studies Ordered:   Orders Placed This Encounter  Procedures   CT CARDIAC SCORING (SELF PAY ONLY)   Lipid panel   Comprehensive metabolic panel   Lipoprotein A (LPA)   Hemoglobin A1c   Meds ordered this encounter  Medications   valsartan (DIOVAN) 160 MG tablet    Sig: Take 1 tablet (160 mg total) by mouth daily.    Dispense:  90 tablet    Refill:  3    Discontinue linsopril    Patient Instructions / Medication Changes & Studies & Tests Ordered   Patient Instructions  Medication Instructions:   Stop Lisinopril   Start taking Valsartan 160 mg - for the first week take 1/2 tablet  daily then increase to a full tablet daily   *If you need a refill on your cardiac medications before your next appointment, please call your pharmacy*   Lab Work: [place] L[facility]  If you have labs (blood work) drawn today and your tests are completely normal, you will receive your results only by: MyChart Message (if you have MyChart) OR A paper copy in the mail If you have any lab test that is abnormal or we need to change your treatment, we will call you to review the results.   Testing/Procedures:  CT coronary calcium score.   Test locations:  [address]  This is $99 out of pocket.  Your next appointment:   3 month(s)  The format for your next appointment:   In Person  Provider:   Glenetta Hew, MD         Leonie Man, MD, MS Glenetta Hew, M.D., M.S. Interventional Cardiologist  [place]  Pager # [phone] Phone # [phone] [address] [place], [place] [address]   Thank you for choosing [facility] [place]!!

## 2022-08-15 LAB — LIPID PANEL
Chol/HDL Ratio: 2.8 ratio (ref 0.0–4.4)
Cholesterol, Total: 185 mg/dL (ref 100–199)
HDL: 67 mg/dL (ref >39)
LDL Chol Calc (NIH): 91 mg/dL (ref 0–99)
Triglycerides: 157 mg/dL — ABNORMAL HIGH (ref 0–149)
VLDL Cholesterol Cal: 27 mg/dL (ref 5–40)

## 2022-08-15 LAB — HEMOGLOBIN A1C
Est. average glucose Bld gHb Est-mCnc: 123 mg/dL
Hgb A1c MFr Bld: 5.9 % — ABNORMAL HIGH (ref 4.8–5.6)

## 2022-08-15 LAB — COMPREHENSIVE METABOLIC PANEL
ALT: 12 IU/L (ref 0–32)
AST: 11 IU/L (ref 0–40)
Albumin/Globulin Ratio: 1.7 (ref 1.2–2.2)
Albumin: 4 g/dL (ref 3.8–4.9)
Alkaline Phosphatase: 91 IU/L (ref 44–121)
BUN/Creatinine Ratio: 17 (ref 9–23)
BUN: 12 mg/dL (ref 6–24)
Bilirubin Total: 0.3 mg/dL (ref 0.0–1.2)
CO2: 27 mmol/L (ref 20–29)
Calcium: 9 mg/dL (ref 8.7–10.2)
Chloride: 101 mmol/L (ref 96–106)
Creatinine, Ser: 0.72 mg/dL (ref 0.57–1.00)
Globulin, Total: 2.4 g/dL (ref 1.5–4.5)
Glucose: 112 mg/dL — ABNORMAL HIGH (ref 70–99)
Potassium: 4.5 mmol/L (ref 3.5–5.2)
Sodium: 141 mmol/L (ref 134–144)
Total Protein: 6.4 g/dL (ref 6.0–8.5)
eGFR: 96 mL/min/{1.73_m2} (ref >59)

## 2022-08-15 LAB — LIPOPROTEIN A (LPA): Lipoprotein (a): 32.2 nmol/L (ref <75.0)

## 2022-08-19 ENCOUNTER — Encounter: Payer: Self-pay | Admitting: Cardiology

## 2022-08-19 NOTE — Assessment & Plan Note (Addendum)
Last labs were checked in April.  LDL was 118 and triglycerides 106.  She is obese.  Does not have a diagnosis of diabetes with an A1c of 6.0, but was started on GLP-1 agonist (Saxenda) however she never started it because she did not get the prescription.  She is not on statin.  Risk factors include: Blood pressure, obesity and basically sedentary lifestyle with former smoking ->   for risk stratification we will check a Coronary Calcium Score in order to determine if we need to be more aggressive treating lipids.  Recheck lipid panel as well as LP(a)  Recheck A1c

## 2022-08-19 NOTE — Assessment & Plan Note (Signed)
Please lose weight.  GLP-1 agonist was probably a good idea.  Unfortunately she never got the prescription filled.  She needs a follow-up with healthy weight and wellness.[name]

## 2022-08-19 NOTE — Assessment & Plan Note (Addendum)
Elevate feet.  She is taking Lasix 3 to 4 days a week.  Recommend support stockings.  Also recommend getting up and walking-doing exercises.

## 2022-08-19 NOTE — Assessment & Plan Note (Signed)
Completed short DOAC course.  Stable now.

## 2022-08-19 NOTE — Assessment & Plan Note (Addendum)
Still has reproducible chest pain I think a lot of it has to do with her sleeping on her side and being obese with large breasts.  We talked about the importance of staying active and trying to lose weight.  Trying to avoid sleeping on her side.  Can use warm compresses to help pain.  For risk ratification for cardiac standpoint we are checking Coronary Calcium Score.

## 2022-08-19 NOTE — Assessment & Plan Note (Signed)
Blood pressure is little high today.  She is somewhat anxious.  Also little concerned that she is having cough.  Plan:  DC lisinopril and convert to ARB-valsartan 160 mg starting off with 1/2 tablet daily.  Check chemistry panel along with lipids

## 2022-09-20 ENCOUNTER — Ambulatory Visit: Payer: BC Managed Care – PPO

## 2022-10-01 ENCOUNTER — Encounter: Payer: Self-pay | Admitting: Family Medicine

## 2022-10-01 ENCOUNTER — Ambulatory Visit (INDEPENDENT_AMBULATORY_CARE_PROVIDER_SITE_OTHER): Payer: BC Managed Care – PPO | Admitting: Family Medicine

## 2022-10-01 VITALS — BP 129/80 | HR 76 | Ht 66.0 in | Wt 284.0 lb

## 2022-10-01 DIAGNOSIS — Z0000 Encounter for general adult medical examination without abnormal findings: Secondary | ICD-10-CM

## 2022-10-01 DIAGNOSIS — R221 Localized swelling, mass and lump, neck: Secondary | ICD-10-CM | POA: Diagnosis not present

## 2022-10-01 MED ORDER — KETOCONAZOLE 2 % EX SHAM: 1.0000 | MEDICATED_SHAMPOO | CUTANEOUS | 1 refills | Status: AC | PRN

## 2022-10-01 MED ORDER — BETAMETHASONE DIPROPIONATE 0.05 % EX CREA: TOPICAL_CREAM | Freq: Two times a day (BID) | CUTANEOUS | 0 refills | Status: AC

## 2022-10-01 MED ORDER — VALACYCLOVIR HCL 1 G PO TABS: 2000.0000 mg | ORAL_TABLET | Freq: Once | ORAL | 0 refills | Status: AC

## 2022-10-01 NOTE — Assessment & Plan Note (Signed)
I think she would do well with GLP-1 medication.  I recommend that she start working on dietary change as well as incorporation of intermediate exercise over the next couple months follow-up around the first the year we can discuss possibly adding Zepbound or Wegovy.

## 2022-10-01 NOTE — Patient Instructions (Signed)
Preventive Care 40-60 Years Old, Female Preventive care refers to lifestyle choices and visits with your health care provider that can promote health and wellness. Preventive care visits are also called wellness exams. What can I expect for my preventive care visit? Counseling Your health care provider may ask you questions about your: Medical history, including: Past medical problems. Family medical history. Pregnancy history. Current health, including: Menstrual cycle. Method of birth control. Emotional well-being. Home life and relationship well-being. Sexual activity and sexual health. Lifestyle, including: Alcohol, nicotine or tobacco, and drug use. Access to firearms. Diet, exercise, and sleep habits. Work and work environment. Sunscreen use. Safety issues such as seatbelt and bike helmet use. Physical exam Your health care provider will check your: Height and weight. These may be used to calculate your BMI (body mass index). BMI is a measurement that tells if you are at a healthy weight. Waist circumference. This measures the distance around your waistline. This measurement also tells if you are at a healthy weight and may help predict your risk of certain diseases, such as type 2 diabetes and high blood pressure. Heart rate and blood pressure. Body temperature. Skin for abnormal spots. What immunizations do I need?  Vaccines are usually given at various ages, according to a schedule. Your health care provider will recommend vaccines for you based on your age, medical history, and lifestyle or other factors, such as travel or where you work. What tests do I need? Screening Your health care provider may recommend screening tests for certain conditions. This may include: Lipid and cholesterol levels. Diabetes screening. This is done by checking your blood sugar (glucose) after you have not eaten for a while (fasting). Pelvic exam and Pap test. Hepatitis B test. Hepatitis C  test. HIV (human immunodeficiency virus) test. STI (sexually transmitted infection) testing, if you are at risk. Lung cancer screening. Colorectal cancer screening. Mammogram. Talk with your health care provider about when you should start having regular mammograms. This may depend on whether you have a family history of breast cancer. BRCA-related cancer screening. This may be done if you have a family history of breast, ovarian, tubal, or peritoneal cancers. Bone density scan. This is done to screen for osteoporosis. Talk with your health care provider about your test results, treatment options, and if necessary, the need for more tests. Follow these instructions at home: Eating and drinking  Eat a diet that includes fresh fruits and vegetables, whole grains, lean protein, and low-fat dairy products. Take vitamin and mineral supplements as recommended by your health care provider. Do not drink alcohol if: Your health care provider tells you not to drink. You are pregnant, may be pregnant, or are planning to become pregnant. If you drink alcohol: Limit how much you have to 0-1 drink a day. Know how much alcohol is in your drink. In the [place] one drink equals one 12 oz bottle of beer (355 mL), one 5 oz glass of wine (148 mL), or one 1 oz glass of hard liquor (44 mL). Lifestyle Brush your teeth every morning and night with fluoride toothpaste. Floss one time each day. Exercise for at least 30 minutes 5 or more days each week. Do not use any products that contain nicotine or tobacco. These products include cigarettes, chewing tobacco, and vaping devices, such as e-cigarettes. If you need help quitting, ask your health care provider. Do not use drugs. If you are sexually active, practice safe sex. Use a condom or other form of protection to   prevent STIs. If you do not wish to become pregnant, use a form of birth control. If you plan to become pregnant, see your health care provider for a  prepregnancy visit. Take aspirin only as told by your health care provider. Make sure that you understand how much to take and what form to take. Work with your health care provider to find out whether it is safe and beneficial for you to take aspirin daily. Find healthy ways to manage stress, such as: Meditation, yoga, or listening to music. Journaling. Talking to a trusted person. Spending time with friends and family. Minimize exposure to UV radiation to reduce your risk of skin cancer. Safety Always wear your seat belt while driving or riding in a vehicle. Do not drive: If you have been drinking alcohol. Do not ride with someone who has been drinking. When you are tired or distracted. While texting. If you have been using any mind-altering substances or drugs. Wear a helmet and other protective equipment during sports activities. If you have firearms in your house, make sure you follow all gun safety procedures. Seek help if you have been physically or sexually abused. What's next? Visit your health care provider once a year for an annual wellness visit. Ask your health care provider how often you should have your eyes and teeth checked. Stay up to date on all vaccines. This information is not intended to replace advice given to you by your health care provider. Make sure you discuss any questions you have with your health care provider. Document Revised: 04/26/2021 Document Reviewed: 04/26/2021 Elsevier Patient Education  [place].

## 2022-10-01 NOTE — Progress Notes (Signed)
Morgan Maddox - 61 y.o. female MRN [id number]  Date of birth: 1961-04-03  Subjective Chief Complaint  Patient presents with   Annual Exam    HPI Morgan Maddox is a 61 y.o. female here today for annual exam.   She has purchased a place at [facility] and feels like her stress is much better since getting better.  Reports that she is doing pretty well.  Has flare of cold sore on the lower lip. Has used abreva previously but this hasn't worked well for her.    Has noticed enlarged lump on the left side of her neck.  This is nonpainful.  Denies any fever or chills.  She is interested in losing weight.  She is fairly sedentary.  She feels like her diet could be better as well.  She would like to try injectable medications for weight loss.  She is a former smoker.  Rare alcohol use.  Review of Systems  Constitutional:  Negative for chills, fever, malaise/fatigue and weight loss.  HENT:  Negative for congestion, ear pain and sore throat.   Eyes:  Negative for blurred vision, double vision and pain.  Respiratory:  Negative for cough and shortness of breath.   Cardiovascular:  Negative for chest pain and palpitations.  Gastrointestinal:  Negative for abdominal pain, blood in stool, constipation, heartburn and nausea.  Genitourinary:  Negative for dysuria and urgency.  Musculoskeletal:  Negative for joint pain and myalgias.  Neurological:  Negative for dizziness and headaches.  Endo/Heme/Allergies:  Does not bruise/bleed easily.  Psychiatric/Behavioral:  Negative for depression. The patient is not nervous/anxious and does not have insomnia.     Allergies  Allergen Reactions   Calamine Rash   Pramoxine-Calamine Rash and Other (See Comments)   Macrobid [Nitrofurantoin Macrocrystal] Nausea Only   Versed [Midazolam]    Diphenhydramine Hcl Rash    Past Medical History:  Diagnosis Date   Anxiety    Back pain    Bilateral ovarian cysts    Chest pain    Chronic headache     Constipation    Edema, lower extremity    Family history of premature CAD    Brother had an MI, sister has CAD. Mother had an MI at age 78. Still alive at 98.   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 Periapical defect with partial improvement during rest. No wall motion abnormality noted. Likely related to breast attenuation, cannot exclude small area of periapical ischemia. --> Cardiac catheterization not recommended. LOW RISK   OVARIAN CYST SURGERY     TONSILLECTOMY     Transverse Sinus Stent  07/2017   [facility] - > for IHH/pseudotumor cerebri    Social History   Socioeconomic History  Marital status: Divorced    Spouse name: Not on file   Number of children: 1   Years of education: Not on file   Highest education level: Not on file  Occupational History   Occupation: [place]    Employer: [employer]  Tobacco Use   Smoking status: Former    Packs/day: 0.50    Years: 25.00    Total pack years: 12.50    Types: Cigarettes   Smokeless tobacco: Never  Vaping Use   Vaping Use: Never used  Substance and Sexual Activity   Alcohol use: Yes    Alcohol/week: 0.0 standard drinks of alcohol    Comment: social   Drug use: No   Sexual activity: Not Currently  Other Topics Concern   Not on file  Social History Narrative    She works from home. Works 10-12 hour shifts. Significant social stress.   Lives alone. Spent most of her time alone. Does not usually interact personally with other people besides her sister   Social Determinants of Health   Financial Resource Strain: Not on file  Food Insecurity: Not on file  Transportation Needs: Not on file  Physical Activity: Not on file  Stress: Not on file  Social Connections: Not on file    Family History  Problem Relation Age of Onset   Heart failure Mother        Long-standing CAD   Heart attack Mother        First MI in 44s   Coronary artery disease Mother    Diabetes Mother    Colon polyps Mother    High blood pressure Mother    High Cholesterol Mother    Thyroid disease Mother    Depression Mother    Obesity Mother    Coronary artery disease Sister 110   Colon cancer Neg Hx    Esophageal cancer Neg Hx     Health Maintenance  Topic Date Due   Zoster Vaccines- Shingrix (1 of 2) 01/01/2023 (Originally 09/19/1981)   COVID-19 Vaccine (3 - Pfizer risk series) 01/11/2023 (Originally 03/17/2020)   INFLUENZA VACCINE  02/10/2023 (Originally 06/12/2022)   MAMMOGRAM  09/14/2023   PAP SMEAR-Modifier  09/23/2023   COLONOSCOPY (Pts 45-24yr Insurance coverage will need to be confirmed)  09/10/2027   Hepatitis C Screening  Completed   HIV Screening  Completed   HPV VACCINES  Aged Out     ----------------------------------------------------------------------------------------------------------------------------------------------------------------------------------------------------------------- Physical Exam BP 129/80 (BP Location: Left Arm, Patient Position: Sitting, Cuff Size: Large)   Pulse 76   Ht '5\' 6"'$  (1.676 m)   Wt 284 lb (128.8 kg)   SpO2 98%   BMI 45.84 kg/m   Physical Exam Constitutional:      General: She is not in acute distress. HENT:     Head: Normocephalic and atraumatic.     Right Ear: Tympanic membrane and ear canal normal.      Left Ear: Tympanic membrane and ear canal normal.     Nose: Nose normal.  Eyes:     General: No scleral icterus.    Conjunctiva/sclera: Conjunctivae normal.  Neck:     Thyroid: No thyromegaly.  Cardiovascular:     Rate and Rhythm: Normal rate and regular rhythm.     Heart sounds: Normal heart sounds.  Pulmonary:     Effort: Pulmonary effort is normal.     Breath sounds: Normal breath sounds.  Abdominal:     General: Bowel sounds are normal. There is no distension.  Palpations: Abdomen is soft.     Tenderness: There is no abdominal tenderness. There is no guarding.  Musculoskeletal:        General: Normal range of motion.     Cervical back: Normal range of motion and neck supple.  Lymphadenopathy:     Cervical: No cervical adenopathy.  Skin:    General: Skin is warm and dry.     Findings: No rash.  Neurological:     General: No focal deficit present.     Mental Status: She is alert and oriented to person, place, and time.     Cranial Nerves: No cranial nerve deficit.     Coordination: Coordination normal.  Psychiatric:        Mood and Affect: Mood normal.        Behavior: Behavior normal.     ------------------------------------------------------------------------------------------------------------------------------------------------------------------------------------------------------------------- Assessment and Plan  Well adult exam Well adult Orders Placed This Encounter  Procedures   US Soft Tissue Head/Neck (NON-THYROID)    Standing Status:   Future    Standing Expiration Date:   10/02/2023    Order Specific Question:   Reason for Exam (SYMPTOM  OR DIAGNOSIS REQUIRED)    Answer:   mass of lateral neck    Order Specific Question:   Preferred imaging location?    Answer:   [facility]  Screenings: per lab orders Immunizations:  Declines recommended immunizations.  Anticipatory guidance/Risk factor reduction:  Recommendations per AVS.    Morbid  obesity (BMI 43.44)  I think she would do well with GLP-1 medication.  I recommend that she start working on dietary change as well as incorporation of intermediate exercise over the next couple months follow-up around the first the year we can discuss possibly adding Zepbound or Wegovy.    Meds ordered this encounter  Medications   ketoconazole (NIZORAL) 2 % shampoo    Sig: Apply 1 Application topically as needed.    Dispense:  120 mL    Refill:  1   betamethasone dipropionate 0.05 % cream    Sig: Apply topically 2 (two) times daily.    Dispense:  30 g    Refill:  0   valACYclovir (VALTREX) 1000 MG tablet    Sig: Take 2 tablets (2,000 mg total) by mouth once for 1 dose.    Dispense:  2 tablet    Refill:  0    Return in about 7 weeks (around 11/19/2022) for F/u weight mgmt.    This visit occurred during the SARS-CoV-2 public health emergency.  Safety protocols were in place, including screening questions prior to the visit, additional usage of staff PPE, and extensive cleaning of exam room while observing appropriate contact time as indicated for disinfecting solutions.

## 2022-10-01 NOTE — Assessment & Plan Note (Signed)
Well adult Orders Placed This Encounter  Procedures   US Soft Tissue Head/Neck (NON-THYROID)    Standing Status:   Future    Standing Expiration Date:   10/02/2023    Order Specific Question:   Reason for Exam (SYMPTOM  OR DIAGNOSIS REQUIRED)    Answer:   mass of lateral neck    Order Specific Question:   Preferred imaging location?    Answer:   [facility]  Screenings: per lab orders Immunizations:  Declines recommended immunizations.  Anticipatory guidance/Risk factor reduction:  Recommendations per AVS.

## 2022-10-03 ENCOUNTER — Ambulatory Visit (INDEPENDENT_AMBULATORY_CARE_PROVIDER_SITE_OTHER): Payer: BC Managed Care – PPO

## 2022-10-03 DIAGNOSIS — R221 Localized swelling, mass and lump, neck: Secondary | ICD-10-CM

## 2022-10-03 DIAGNOSIS — Z1231 Encounter for screening mammogram for malignant neoplasm of breast: Secondary | ICD-10-CM | POA: Diagnosis not present

## 2022-10-08 ENCOUNTER — Encounter: Payer: BC Managed Care – PPO | Admitting: Family Medicine

## 2022-10-08 ENCOUNTER — Telehealth: Payer: Self-pay

## 2022-10-08 NOTE — Telephone Encounter (Signed)
Pt lvm requesting Ultrasound results. Please advise.

## 2022-10-08 NOTE — Telephone Encounter (Signed)
Pt would like to know when she should be concerned since having 2 neuro surgeries, several nasal surgeries. What's the next step concerning the neck lump?

## 2022-10-08 NOTE — Telephone Encounter (Signed)
No additional steps needed at this time as areas with benign appearance.  If getting larger or changing further please let me know.

## 2022-10-08 NOTE — Telephone Encounter (Signed)
Benign appearing lymph nodes. Nothing concerning noted on the ultrasound.   CM

## 2022-10-10 ENCOUNTER — Encounter: Payer: Self-pay | Admitting: Family Medicine

## 2022-10-10 NOTE — Telephone Encounter (Signed)
If having new symptoms we can get her back in to re-evaluate if she would like.    CM

## 2022-10-10 NOTE — Telephone Encounter (Signed)
Pt appreciated the callback. She's forwarded her images to previous Neuro / ENT for additional feedback due to white spot on back of the throat and new hoarseness.

## 2022-10-11 ENCOUNTER — Ambulatory Visit: Payer: BC Managed Care – PPO

## 2022-11-19 ENCOUNTER — Ambulatory Visit: Payer: BC Managed Care – PPO | Admitting: Family Medicine

## 2022-11-28 ENCOUNTER — Ambulatory Visit (INDEPENDENT_AMBULATORY_CARE_PROVIDER_SITE_OTHER): Payer: BC Managed Care – PPO | Admitting: Family Medicine

## 2022-11-28 ENCOUNTER — Encounter: Payer: Self-pay | Admitting: Family Medicine

## 2022-11-28 DIAGNOSIS — I10 Essential (primary) hypertension: Secondary | ICD-10-CM | POA: Diagnosis not present

## 2022-11-28 MED ORDER — ZEPBOUND 2.5 MG/0.5ML ~~LOC~~ SOAJ: 2.5000 mg | SUBCUTANEOUS | 0 refills | Status: DC

## 2022-11-28 MED ORDER — ZEPBOUND 5 MG/0.5ML ~~LOC~~ SOAJ: 5.0000 mg | SUBCUTANEOUS | 0 refills | Status: DC

## 2022-11-28 MED ORDER — ZEPBOUND 7.5 MG/0.5ML ~~LOC~~ SOAJ: 7.5000 mg | SUBCUTANEOUS | 1 refills | Status: DC

## 2022-11-28 NOTE — Assessment & Plan Note (Signed)
Blood pressure is not well-controlled at this time.  Recommend increasing valsartan to full tab.  Return in 10 to 14 days for blood pressure recheck.

## 2022-11-28 NOTE — Progress Notes (Signed)
Morgan Maddox - 62 y.o. female MRN [id number]  Date of birth: 03-25-1961  Subjective Chief Complaint  Patient presents with   Obesity    HPI [name] is a 62 year old female here today for follow-up visit.  She has concerns about continued weight gain.  Her weight is up 7 pounds since November.  She admits that her diet has not been very good over the holidays.  She has had some success with keto in the past.  She does plan to work on dietary changes but would like to to try medication in addition to this to help with appetite.  She is interested in trying GLP-1 injectables.  She has never tried this before.  Blood pressure is elevated today.  Currently taking one half tab valsartan 160 mg.  Tolerating well without side effects.  She has not had chest pain, shortness of breath, palpitations, headaches or vision changes.  ROS:  A comprehensive ROS was completed and negative except as noted per HPI  Allergies  Allergen Reactions   Calamine Rash   Pramoxine-Calamine Rash and Other (See Comments)   Macrobid [Nitrofurantoin Macrocrystal] Nausea Only   Versed [Midazolam]    Diphenhydramine Hcl Rash    Past Medical History:  Diagnosis Date   Anxiety    Back pain    Bilateral ovarian cysts    Chest pain    Chronic headache    Constipation    Edema, lower extremity    Family history of premature CAD    Brother had an MI, sister has CAD. Mother had an MI at age 66. Still alive at 27.   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 Periapical defect with partial improvement during rest. No wall motion abnormality noted. Likely related to breast attenuation, cannot exclude small area of periapical ischemia. --> Cardiac catheterization not recommended. LOW RISK   OVARIAN CYST SURGERY     TONSILLECTOMY     Transverse Sinus Stent  07/2017   [facility] - > for IHH/pseudotumor cerebri    Social History   Socioeconomic History   Marital status: Divorced    Spouse name: Not on file   Number of children: 1   Years of education: Not on file   Highest education level: Not on file  Occupational History   Occupation: [place]    Employer: [employer]  Tobacco Use   Smoking status: Former    Packs/day: 0.50    Years: 25.00    Total pack years: 12.50    Types: Cigarettes   Smokeless tobacco: Never  Vaping Use   Vaping Use: Never used  Substance and Sexual Activity   Alcohol use: Yes    Alcohol/week: 0.0 standard drinks of alcohol  Comment: social   Drug use: No   Sexual activity: Not Currently  Other Topics Concern   Not on file  Social History Narrative   She works from home. Works 10-12 hour shifts. Significant social stress.   Lives alone. Spent most of her time alone. Does not usually interact personally with other people besides her sister   Social Determinants of Health   Financial Resource Strain: Not on file  Food Insecurity: Not on file  Transportation Needs: Not on file  Physical Activity: Not on file  Stress: Not on file  Social Connections: Not on file    Family History  Problem Relation Age of Onset   Heart failure Mother        Long-standing CAD   Heart attack Mother        First MI in 69s   Coronary artery disease Mother    Diabetes Mother    Colon polyps Mother    High blood pressure  Mother    High Cholesterol Mother    Thyroid disease Mother    Depression Mother    Obesity Mother    Coronary artery disease Sister 16   Colon cancer Neg Hx    Esophageal cancer Neg Hx     Health Maintenance  Topic Date Due   DTaP/Tdap/Td (1 - Tdap) Never done   Zoster Vaccines- Shingrix (1 of 2) 01/01/2023 (Originally 09/19/2012)   COVID-19 Vaccine (3 - 2023-24 season) 01/11/2023 (Originally 07/13/2022)   INFLUENZA VACCINE  02/10/2023 (Originally 06/12/2022)   PAP SMEAR-Modifier  09/23/2023   MAMMOGRAM  10/03/2024   COLONOSCOPY (Pts 45-84yr Insurance coverage will need to be confirmed)  09/10/2027   Hepatitis C Screening  Completed   HIV Screening  Completed   HPV VACCINES  Aged Out     ----------------------------------------------------------------------------------------------------------------------------------------------------------------------------------------------------------------- Physical Exam BP (!) 155/80   Pulse 80   Ht '5\' 6"'$  (1.676 m)   Wt 291 lb (132 kg)   SpO2 100%   BMI 46.97 kg/m   Physical Exam Constitutional:      Appearance: Normal appearance.  HENT:     Head: Normocephalic and atraumatic.  Eyes:     General: No scleral icterus. Cardiovascular:     Rate and Rhythm: Normal rate and regular rhythm.  Pulmonary:     Effort: Pulmonary effort is normal.     Breath sounds: Normal breath sounds.  Musculoskeletal:     Cervical back: Neck supple.  Neurological:     Mental Status: She is alert.  Psychiatric:        Mood and Affect: Mood normal.        Behavior: Behavior normal.     ------------------------------------------------------------------------------------------------------------------------------------------------------------------------------------------------------------------- Assessment and Plan  Morbid obesity (BMI 43.44)  Please obesity with comorbidities of hypertension, prediabetes, hyperlipidemia and idiopathic intracranial  hypertension.  We discussed options to help with weight management.  Encourage dietary change as well as cooperation regular exercise.  Adding Zepbound.  Provided with coupon card.  She will let me know if this is not covered.  Essential hypertension Blood pressure is not well-controlled at this time.  Recommend increasing valsartan to full tab.  Return in 10 to 14 days for blood pressure recheck.   Meds ordered this encounter  Medications   tirzepatide (ZEPBOUND) 2.5 MG/0.5ML Pen    Sig: Inject 2.5 mg into the skin once a week. Increase to '5mg'$  after 4 weeks    Dispense:  2 mL    Refill:  0   tirzepatide (ZEPBOUND) 5 MG/0.5ML  Pen    Sig: Inject 5 mg into the skin once a week. Increase to 7.'5mg'$  after 4 weeks    Dispense:  2 mL    Refill:  0   tirzepatide (ZEPBOUND) 7.5 MG/0.5ML Pen    Sig: Inject 7.5 mg into the skin once a week.    Dispense:  2 mL    Refill:  1    Return in about 3 months (around 02/27/2023) for F/u weight mgmt. .    This visit occurred during the SARS-CoV-2 public health emergency.  Safety protocols were in place, including screening questions prior to the visit, additional usage of staff PPE, and extensive cleaning of exam room while observing appropriate contact time as indicated for disinfecting solutions.

## 2022-11-28 NOTE — Assessment & Plan Note (Signed)
Please obesity with comorbidities of hypertension, prediabetes, hyperlipidemia and idiopathic intracranial hypertension.  We discussed options to help with weight management.  Encourage dietary change as well as cooperation regular exercise.  Adding Zepbound.  Provided with coupon card.  She will let me know if this is not covered.

## 2022-12-03 ENCOUNTER — Telehealth: Payer: Self-pay

## 2022-12-03 NOTE — Telephone Encounter (Signed)
Initiated Prior authorization [id number] 2.'5MG'$ /0.5ML pen-injectors Via: Covermymeds Case/Key:[id number] Status: approved  as of 12/03/22 Reason:Coverage Start Date:12/03/2022;Coverage End Date:07/01/2023; Notified Pt via: Mychart

## 2022-12-10 ENCOUNTER — Ambulatory Visit (INDEPENDENT_AMBULATORY_CARE_PROVIDER_SITE_OTHER): Payer: BC Managed Care – PPO | Admitting: Family Medicine

## 2022-12-10 ENCOUNTER — Ambulatory Visit: Payer: BC Managed Care – PPO | Admitting: Cardiology

## 2022-12-10 ENCOUNTER — Ambulatory Visit: Payer: BC Managed Care – PPO | Admitting: Family Medicine

## 2022-12-10 VITALS — BP 133/80 | HR 67 | Resp 20 | Ht 66.0 in

## 2022-12-10 DIAGNOSIS — I10 Essential (primary) hypertension: Secondary | ICD-10-CM

## 2022-12-10 NOTE — Progress Notes (Signed)
   Subjective:    Patient ID: Morgan Maddox, female    DOB: 1961/03/18, 62 y.o.   MRN: [id number]  HPI  Patient here for a Blood Pressure check. Denies trouble sleeping, palpitations or medication problems.  Patient just started taking 1 tablet instead of 1/2 a tablet 2 days ago.    Review of Systems     Objective:   Physical Exam        Assessment & Plan:   Patient advised to follow up for a nurse visit in 2 - 3 weeks. The patient is going to the beach today and stated that she will come back in April at her scheduled appointment with her PCP.

## 2022-12-10 NOTE — Progress Notes (Signed)
Hypertension-blood pressure not quite at goal still a little bit borderline today but it does look better.  She just started taking a whole tab of her blood pressure medication 2 days ago.  Discussed that it can take 1 to 2 weeks to reach full efficacy so would rather see her back on a full tab when she returns for her regular visit before adjusting it today.

## 2022-12-14 ENCOUNTER — Telehealth: Payer: Self-pay

## 2022-12-14 NOTE — Telephone Encounter (Signed)
While patient had BP nurse visit advised that PA's were sent for [facility], Mancel Parsons and Zepboud. Pt stated that per Rosey Bath would be approved if IIH dx was included in request.   Resubmitted Mounjaro PA on 12/14/2022.

## 2022-12-14 NOTE — Telephone Encounter (Signed)
Called patient to review and got voice mail.  LVM to return call. Mounjaro or Ozempic will not be approved because she does not have diabetes and these are only FDA approved for diabetes.  Her insurance will cover zepbound or wegovy for weight loss as she has obesity with co-morbidity (IIH, HTN and Prediabetes).  The Co-pay card should reduce the price some but that is probably the best it is going to get.

## 2023-02-05 ENCOUNTER — Other Ambulatory Visit: Payer: Self-pay | Admitting: Family Medicine

## 2023-02-25 ENCOUNTER — Ambulatory Visit: Payer: BC Managed Care – PPO | Admitting: Family Medicine

## 2023-03-04 ENCOUNTER — Ambulatory Visit: Payer: BC Managed Care – PPO | Admitting: Family Medicine

## 2023-03-04 ENCOUNTER — Encounter: Payer: Self-pay | Admitting: Family Medicine

## 2023-03-04 VITALS — BP 132/76 | HR 93 | Temp 98.6°F | Ht 66.0 in | Wt 272.1 lb

## 2023-03-04 DIAGNOSIS — Z6841 Body Mass Index (BMI) 40.0 and over, adult: Secondary | ICD-10-CM | POA: Diagnosis not present

## 2023-03-04 MED ORDER — ZEPBOUND 7.5 MG/0.5ML ~~LOC~~ SOAJ: 7.5000 mg | SUBCUTANEOUS | 1 refills | Status: DC

## 2023-03-04 MED ORDER — ZEPBOUND 10 MG/0.5ML ~~LOC~~ SOAJ: 10.0000 mg | SUBCUTANEOUS | 0 refills | Status: DC

## 2023-03-04 MED ORDER — ZEPBOUND 12.5 MG/0.5ML ~~LOC~~ SOAJ: 12.5000 mg | SUBCUTANEOUS | 0 refills | Status: DC

## 2023-03-04 NOTE — Progress Notes (Signed)
Acute Office Visit  Subjective:     Patient ID: Morgan Maddox, female    DOB: 1961-04-28, 62 y.o.   MRN: [id number]  Chief Complaint  Patient presents with   Obesity    Weight Management-Has not been able to get Zepbound 7.5mg . She took  for her 7.5 dose, otherwise down 18 pounds today.    HPI Patient is in today for weight management. She has lost 19lbs since last visit. She has been unable to get zepbound 7.5mg  and had to take another dose of her . She is here today for titration of medication. Denies any side effects or concerns with this medication.   Review of Systems  Constitutional:  Negative for chills and fever.  Respiratory:  Negative for cough and shortness of breath.   Cardiovascular:  Negative for chest pain.  Neurological:  Negative for headaches.        Objective:    BP 132/76   Pulse 93   Temp 98.6 F (37 C) (Oral)   Ht  (1.676 m)   Wt 272 lb 1.9 oz (123.4 kg)   SpO2 99%   BMI 43.92 kg/m    Physical Exam Vitals and nursing note reviewed.  Constitutional:      Appearance: Normal appearance. She is obese.  HENT:     Head: Normocephalic and atraumatic.     Right Ear: External ear normal.     Left Ear: External ear normal.     Nose: Nose normal.  Eyes:     Conjunctiva/sclera: Conjunctivae normal.  Cardiovascular:     Rate and Rhythm: Normal rate and regular rhythm.  Pulmonary:     Effort: Pulmonary effort is normal.     Breath sounds: Normal breath sounds.  Neurological:     General: No focal deficit present.     Mental Status: She is alert and oriented to person, place, and time.  Psychiatric:        Mood and Affect: Mood normal.        Behavior: Behavior normal.        Thought Content: Thought content normal.        Judgment: Judgment normal.     No results found for any visits on 03/04/23.      Assessment & Plan:   Problem List Items Addressed This Visit       Other   Class 3 severe obesity with serious  comorbidity and body mass index (BMI) of 40.0 to 44.9 in adult - Primary    - pt doing well on zepbound will go ahead and send in the next three titration doses - follow up in three months - [facility] while in office and they had zepbound 7.5mg  in stock. Pt is going to pick up today      Relevant Medications   tirzepatide (ZEPBOUND) 7.5 MG/0.5ML Pen   tirzepatide (ZEPBOUND) 10 MG/0.5ML Pen   tirzepatide (ZEPBOUND) 12.5 MG/0.5ML Pen    Meds ordered this encounter  Medications   tirzepatide (ZEPBOUND) 7.5 MG/0.5ML Pen    Sig: Inject 7.5 mg into the skin once a week.    Dispense:  2 mL    Refill:  1   tirzepatide (ZEPBOUND) 10 MG/0.5ML Pen    Sig: Inject 10 mg into the skin once a week.    Dispense:  2 mL    Refill:  0   tirzepatide (ZEPBOUND) 12.5 MG/0.5ML Pen    Sig: Inject 12.5  mg into the skin once a week.    Dispense:  2 mL    Refill:  0    Return in about 3 months (around 06/03/2023).  Charlton Amor, DO

## 2023-03-04 NOTE — Assessment & Plan Note (Signed)
-   pt doing well on zepbound will go ahead and send in the next three titration doses - follow up in three months - [facility] while in office and they had zepbound 7.5mg  in stock. Pt is going to pick up today

## 2023-03-05 ENCOUNTER — Other Ambulatory Visit: Payer: Self-pay

## 2023-03-05 DIAGNOSIS — R7303 Prediabetes: Secondary | ICD-10-CM

## 2023-03-05 MED ORDER — ZEPBOUND 10 MG/0.5ML ~~LOC~~ SOAJ
10.0000 mg | SUBCUTANEOUS | 0 refills | Status: DC
Start: 2023-03-05 — End: 2023-07-18

## 2023-03-05 MED ORDER — ZEPBOUND 7.5 MG/0.5ML ~~LOC~~ SOAJ
7.5000 mg | SUBCUTANEOUS | 1 refills | Status: DC
Start: 2023-03-05 — End: 2023-07-18

## 2023-03-05 MED ORDER — ZEPBOUND 12.5 MG/0.5ML ~~LOC~~ SOAJ: 12.5000 mg | SUBCUTANEOUS | 0 refills | Status: DC

## 2023-03-06 ENCOUNTER — Other Ambulatory Visit: Payer: Self-pay | Admitting: Family Medicine

## 2023-03-06 ENCOUNTER — Encounter: Payer: Self-pay | Admitting: Family Medicine

## 2023-03-06 NOTE — Telephone Encounter (Signed)
I called patient. She wanted the prescription sent to [facility]. The prescription has been sent to [facility].

## 2023-03-25 ENCOUNTER — Encounter: Payer: Self-pay | Admitting: Family Medicine

## 2023-03-28 ENCOUNTER — Encounter: Payer: Self-pay | Admitting: Family Medicine

## 2023-03-28 MED ORDER — SEMAGLUTIDE-WEIGHT MANAGEMENT 1 MG/0.5ML ~~LOC~~ SOAJ: 1.0000 mg | SUBCUTANEOUS | 0 refills | Status: DC

## 2023-03-28 MED ORDER — SEMAGLUTIDE-WEIGHT MANAGEMENT 0.5 MG/0.5ML ~~LOC~~ SOAJ: 0.5000 mg | SUBCUTANEOUS | 0 refills | Status: AC

## 2023-03-28 MED ORDER — SEMAGLUTIDE-WEIGHT MANAGEMENT 0.25 MG/0.5ML ~~LOC~~ SOAJ: 0.2500 mg | SUBCUTANEOUS | 0 refills | Status: DC

## 2023-03-28 NOTE — Telephone Encounter (Signed)
[facility] sent in.  We can change back to zepbound once stock improves.

## 2023-04-20 ENCOUNTER — Other Ambulatory Visit: Payer: Self-pay | Admitting: Family Medicine

## 2023-06-18 ENCOUNTER — Telehealth: Payer: Self-pay | Admitting: Family Medicine

## 2023-06-18 ENCOUNTER — Other Ambulatory Visit: Payer: Self-pay | Admitting: Family Medicine

## 2023-06-18 MED ORDER — WEGOVY 1.7 MG/0.75ML ~~LOC~~ SOAJ: 1.7000 mg | SUBCUTANEOUS | 1 refills | Status: DC

## 2023-06-18 NOTE — Telephone Encounter (Signed)
Patient called in needing the next dosage Wegovy injection, 1.75? Unsure of dosage. Please advise.  [facility] [employer] [facility]

## 2023-07-18 ENCOUNTER — Telehealth (INDEPENDENT_AMBULATORY_CARE_PROVIDER_SITE_OTHER): Payer: BC Managed Care – PPO | Admitting: Family Medicine

## 2023-07-18 ENCOUNTER — Encounter: Payer: Self-pay | Admitting: Family Medicine

## 2023-07-18 VITALS — Ht 66.0 in | Wt 272.0 lb

## 2023-07-18 DIAGNOSIS — Z6841 Body Mass Index (BMI) 40.0 and over, adult: Secondary | ICD-10-CM | POA: Diagnosis not present

## 2023-07-18 MED ORDER — WEGOVY 1.7 MG/0.75ML ~~LOC~~ SOAJ: 1.7000 mg | SUBCUTANEOUS | 1 refills | Status: DC

## 2023-07-18 NOTE — Progress Notes (Signed)
Pt states consistent emesis with Wegovy.

## 2023-07-18 NOTE — Assessment & Plan Note (Signed)
Discussed trying different injection site.  Will continue at 1.7mg  and she will try injection into thigh.  If not improving with this we can try change to Zepbound.  Discussed need to work on dietary changes as well as high fat/high carbs will make nausea worse.

## 2023-07-18 NOTE — Addendum Note (Signed)
Addended by: Mammie Lorenzo on: 07/18/2023 02:09 PM   Modules accepted: Orders

## 2023-07-18 NOTE — Progress Notes (Signed)
Morgan Maddox - 62 y.o. female MRN [id number]  Date of birth: 10/21/1961   This visit type was conducted due to national recommendations for restrictions regarding the COVID-19 Pandemic (e.g. social distancing).  This format is felt to be most appropriate for this patient at this time.  All issues noted in this document were discussed and addressed.  No physical exam was performed (except for noted visual exam findings with Video Visits).  I discussed the limitations of evaluation and management by telemedicine and the availability of in person appointments. The patient expressed understanding and agreed to proceed.  I connected withNAME@ on 07/18/23 at  1:10 PM EDT by a video enabled telemedicine application and verified that I am speaking with the correct person using two identifiers.  Present at visit: Morgan Coombe, DO Morgan Maddox   Patient Location: Home [address] [place] [address]   Provider location:   [facility]  Chief Complaint  Patient presents with   Weight Loss    HPI  Morgan Maddox is a 62 y.o. female who presents via audio/video conferencing for a telehealth visit today.  She reports that she has been having side effects from [facility].  She has had persistent nausea as well as some fatigue.  She is injecting in the abdomen.  She has never tried injecting in the thigh.  She has pretty much stopped alcohol intake.     ROS:  A comprehensive ROS was completed and negative except as noted per HPI  Past Medical History:  Diagnosis Date   Anxiety    Back pain    Bilateral ovarian cysts    Chest pain    Chronic headache    Constipation    Edema, lower extremity    Family history of premature CAD    Brother had an MI, sister has CAD. Mother had an MI at age 20. Still alive at 84.   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 Periapical defect with partial improvement during rest. No wall motion abnormality noted. Likely related to breast attenuation, cannot exclude small area of periapical ischemia. --> Cardiac catheterization not recommended. LOW RISK   OVARIAN CYST SURGERY     TONSILLECTOMY     Transverse Sinus Stent  07/2017   [facility] - > for IHH/pseudotumor cerebri    Family History  Problem Relation Age of Onset   Heart failure Mother        Long-standing CAD   Heart attack Mother        First MI in 34s   Coronary artery disease Mother    Diabetes Mother    Colon polyps Mother    High blood pressure Mother    High Cholesterol  Mother    Thyroid disease Mother    Depression Mother    Obesity Mother    Coronary artery disease Sister 67   Colon cancer Neg Hx    Esophageal cancer Neg Hx     Social History   Socioeconomic History   Marital status: Divorced    Spouse name: Not on file   Number of children: 1   Years of education: Not on file   Highest education level: Associate degree: occupational, Scientist, product/process development, or vocational program  Occupational History   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 Works 10-12 hour shifts. Significant social stress.   Lives alone. Spent most of her time alone. Does not usually interact personally with other people besides her sister   Social Determinants of Health   Financial Resource Strain: Low Risk  (03/03/2023)   Overall Financial Resource Strain (CARDIA)    Difficulty of Paying Living Expenses: Not hard at all  Food Insecurity: Patient Declined (03/03/2023)   Hunger Vital Sign    Worried About Running Out of Food in the Last Year: Patient declined    Ran Out of Food in the Last Year: Patient declined  Transportation Needs: No Transportation Needs (03/03/2023)   PRAPARE - Administrator, Civil Service (Medical): No    Lack of Transportation (Non-Medical): No  Physical Activity: Insufficiently Active (03/03/2023)   Exercise Vital Sign    Days of Exercise per Week: 1 day    Minutes of Exercise per Session: 60 min  Stress: No Stress Concern Present (03/03/2023)   [employer] of Occupational Health - Occupational Stress Questionnaire    Feeling of Stress : Only a little  Social Connections: Unknown (03/03/2023)   Social Connection and Isolation Panel [NHANES]    Frequency of Communication with Friends and Family: More than three times a week    Frequency of Social Gatherings with Friends and Family: Once a week    Attends Religious Services: Patient declined    Database administrator or Organizations: No    Attends Engineer, structural: Not on file    Marital Status: Divorced  Intimate Partner Violence: Unknown (02/13/2022)   Received from [employer]   HITS    Physically Hurt: Not on file    Insult or Talk Down To: Not on file    Threaten Physical Harm: Not on file    Scream or Curse:  Not on file     Current Outpatient Medications:    AMBULATORY NON FORMULARY MEDICATION, Medication Name: Compression stockings, please measure to fit, 20-30 mm Hg, Disp: 1 Units, Rfl: 99   AMBULATORY NON FORMULARY MEDICATION, HOME BP MACHINE AROUND THE ARM, Disp: 1 Units, Rfl: 99   aspirin EC 81 MG tablet, Take 81 mg by mouth daily. Swallow whole., Disp: , Rfl:    betamethasone dipropionate 0.05 % cream, Apply topically 2 (  two) times daily., Disp: 30 g, Rfl: 0   furosemide (LASIX) 20 MG tablet, Take 0.5-1 tablets (10-20 mg total) by mouth daily as needed., Disp: 30 tablet, Rfl: 0   ketoconazole (NIZORAL) 2 % shampoo, Apply 1 Application topically as needed., Disp: 120 mL, Rfl: 1   valsartan (DIOVAN) 160 MG tablet, Take 1 tablet (160 mg total) by mouth daily. (Patient taking differently: Take 80 mg by mouth daily.), Disp: 90 tablet, Rfl: 3   Semaglutide-Weight Management (WEGOVY) 1.7 MG/0.75ML SOAJ, Inject 1.7 mg into the skin once a week., Disp: 3 mL, Rfl: 1  EXAM:  VITALS per patient if applicable: Ht 5\' 6"  (1.676 m)   Wt 272 lb (123.4 kg)   BMI 43.90 kg/m   GENERAL: alert, oriented, appears well and in no acute distress  HEENT: atraumatic, conjunttiva clear, no obvious abnormalities on inspection of external nose and ears  NECK: normal movements of the head and neck  LUNGS: on inspection no signs of respiratory distress, breathing rate appears normal, no obvious gross SOB, gasping or wheezing  CV: no obvious cyanosis  MS: moves all visible extremities without noticeable abnormality  PSYCH/NEURO: pleasant and cooperative, no obvious depression or anxiety, speech and thought processing grossly intact  ASSESSMENT AND PLAN:  Discussed the following assessment and plan:  Class 3 severe obesity with serious comorbidity and body mass index (BMI) of 40.0 to 44.9 in adult [facility]) Discussed trying different injection site.  Will continue at 1.7mg  and she will try injection into thigh.   If not improving with this we can try change to Zepbound.  Discussed need to work on dietary changes as well as high fat/high carbs will make nausea worse.      I discussed the assessment and treatment plan with the patient. The patient was provided an opportunity to ask questions and all were answered. The patient agreed with the plan and demonstrated an understanding of the instructions.   The patient was advised to call back or seek an in-person evaluation if the symptoms worsen or if the condition fails to improve as anticipated.    Morgan Coombe, DO

## 2023-07-18 NOTE — Telephone Encounter (Signed)
Patient scheduled.

## 2023-07-22 ENCOUNTER — Telehealth: Payer: Self-pay | Admitting: Family Medicine

## 2023-07-22 NOTE — Telephone Encounter (Signed)
Patient called in for a PA for [facility] 1.7mg . Please advise

## 2023-07-23 NOTE — Telephone Encounter (Signed)
Pt called. PA needed for [facility].

## 2023-07-24 NOTE — Telephone Encounter (Signed)
Patient requests update/needs prior authorization for ;  [facility]  Patient is requesting a call back at [phone].

## 2023-07-26 NOTE — Telephone Encounter (Signed)
PA initialized for [facility].  [place]is Range (Key: Deanne Coffer) Wonda Cerise and [employer] are reviewing your PA request and will respond within 24 hours for Medicaid or up to 72 hours for non-Medicaid plans, based on the required timeframe determined by state or federal regulations.

## 2023-07-29 ENCOUNTER — Encounter: Payer: Self-pay | Admitting: Family Medicine

## 2023-07-30 ENCOUNTER — Other Ambulatory Visit: Payer: Self-pay

## 2023-07-30 MED ORDER — WEGOVY 1.7 MG/0.75ML ~~LOC~~ SOAJ: 1.7000 mg | SUBCUTANEOUS | 1 refills | Status: DC

## 2023-08-20 ENCOUNTER — Encounter: Payer: Self-pay | Admitting: Family Medicine

## 2023-08-20 ENCOUNTER — Other Ambulatory Visit: Payer: Self-pay | Admitting: Family Medicine

## 2023-08-20 DIAGNOSIS — Z1231 Encounter for screening mammogram for malignant neoplasm of breast: Secondary | ICD-10-CM

## 2023-08-21 ENCOUNTER — Other Ambulatory Visit: Payer: Self-pay | Admitting: Family Medicine

## 2023-08-21 MED ORDER — WEGOVY 2.4 MG/0.75ML ~~LOC~~ SOAJ: 2.4000 mg | SUBCUTANEOUS | 2 refills | Status: DC

## 2023-08-24 ENCOUNTER — Other Ambulatory Visit: Payer: Self-pay | Admitting: Cardiology

## 2023-09-12 ENCOUNTER — Other Ambulatory Visit: Payer: Self-pay | Admitting: Family Medicine

## 2023-09-12 MED ORDER — ZEPBOUND 7.5 MG/0.5ML ~~LOC~~ SOAJ: 7.5000 mg | SUBCUTANEOUS | 1 refills | Status: DC

## 2023-09-20 ENCOUNTER — Telehealth: Payer: Self-pay

## 2023-09-20 NOTE — Telephone Encounter (Addendum)
Initiated Prior authorization ZOX:[id number] 7.5MG /0.5ML pen-injectors Via: Covermymeds Case/Key:[id number] Status: approved  as of 09/26/23 Reason:Authorization Expiration Date: 09/24/2024  Notified Pt via: Mychart

## 2023-09-22 ENCOUNTER — Other Ambulatory Visit: Payer: Self-pay | Admitting: Cardiology

## 2023-09-27 ENCOUNTER — Encounter: Payer: Self-pay | Admitting: Family Medicine

## 2023-09-30 ENCOUNTER — Encounter: Payer: Self-pay | Admitting: Family Medicine

## 2023-09-30 NOTE — Telephone Encounter (Signed)
See other MyChart message

## 2023-10-11 ENCOUNTER — Other Ambulatory Visit: Payer: Self-pay | Admitting: Cardiology

## 2023-10-13 ENCOUNTER — Encounter: Payer: Self-pay | Admitting: Family Medicine

## 2023-10-14 NOTE — Telephone Encounter (Signed)
Please have them fax xray results to the clinic.   CM

## 2023-10-21 ENCOUNTER — Ambulatory Visit (HOSPITAL_BASED_OUTPATIENT_CLINIC_OR_DEPARTMENT_OTHER)
Admission: RE | Admit: 2023-10-21 | Discharge: 2023-10-21 | Disposition: A | Discharge status: Home / Self Care | Payer: BC Managed Care – PPO | Source: Ambulatory Visit | Attending: Family Medicine | Admitting: Family Medicine

## 2023-10-21 ENCOUNTER — Ambulatory Visit: Payer: BC Managed Care – PPO

## 2023-10-21 ENCOUNTER — Ambulatory Visit (INDEPENDENT_AMBULATORY_CARE_PROVIDER_SITE_OTHER): Payer: BC Managed Care – PPO | Admitting: Family Medicine

## 2023-10-21 ENCOUNTER — Encounter: Payer: BC Managed Care – PPO | Admitting: Family Medicine

## 2023-10-21 ENCOUNTER — Encounter: Payer: Self-pay | Admitting: Family Medicine

## 2023-10-21 ENCOUNTER — Inpatient Hospital Stay (HOSPITAL_BASED_OUTPATIENT_CLINIC_OR_DEPARTMENT_OTHER): Admission: RE | Admit: 2023-10-21 | Payer: BC Managed Care – PPO | Source: Ambulatory Visit

## 2023-10-21 VITALS — BP 118/76 | HR 83 | Ht 66.0 in | Wt 267.0 lb

## 2023-10-21 DIAGNOSIS — E559 Vitamin D deficiency, unspecified: Secondary | ICD-10-CM | POA: Diagnosis not present

## 2023-10-21 DIAGNOSIS — M542 Cervicalgia: Secondary | ICD-10-CM

## 2023-10-21 DIAGNOSIS — I10 Essential (primary) hypertension: Secondary | ICD-10-CM

## 2023-10-21 DIAGNOSIS — G8929 Other chronic pain: Secondary | ICD-10-CM | POA: Insufficient documentation

## 2023-10-21 DIAGNOSIS — Z0000 Encounter for general adult medical examination without abnormal findings: Secondary | ICD-10-CM

## 2023-10-21 DIAGNOSIS — Z1231 Encounter for screening mammogram for malignant neoplasm of breast: Secondary | ICD-10-CM | POA: Insufficient documentation

## 2023-10-21 DIAGNOSIS — J986 Disorders of diaphragm: Secondary | ICD-10-CM | POA: Insufficient documentation

## 2023-10-21 MED ORDER — DOXYCYCLINE HYCLATE 100 MG PO TABS: 100.0000 mg | ORAL_TABLET | Freq: Two times a day (BID) | ORAL | 0 refills | Status: DC

## 2023-10-21 MED ORDER — VALSARTAN 160 MG PO TABS: 160.0000 mg | ORAL_TABLET | Freq: Every day | ORAL | 1 refills | Status: DC

## 2023-10-21 NOTE — Assessment & Plan Note (Signed)
X-ray of cervical spine ordered. 

## 2023-10-21 NOTE — Patient Instructions (Signed)
Preventive Care 61-61 Years Old, Female Preventive care refers to lifestyle choices and visits with your health care provider that can promote health and wellness. Preventive care visits are also called wellness exams. What can I expect for my preventive care visit? Counseling Your health care provider may ask you questions about your: Medical history, including: Past medical problems. Family medical history. Pregnancy history. Current health, including: Menstrual cycle. Method of birth control. Emotional well-being. Home life and relationship well-being. Sexual activity and sexual health. Lifestyle, including: Alcohol, nicotine or tobacco, and drug use. Access to firearms. Diet, exercise, and sleep habits. Work and work environment. Sunscreen use. Safety issues such as seatbelt and bike helmet use. Physical exam Your health care provider will check your: Height and weight. These may be used to calculate your BMI (body mass index). BMI is a measurement that tells if you are at a healthy weight. Waist circumference. This measures the distance around your waistline. This measurement also tells if you are at a healthy weight and may help predict your risk of certain diseases, such as type 2 diabetes and high blood pressure. Heart rate and blood pressure. Body temperature. Skin for abnormal spots. What immunizations do I need?  Vaccines are usually given at various ages, according to a schedule. Your health care provider will recommend vaccines for you based on your age, medical history, and lifestyle or other factors, such as travel or where you work. What tests do I need? Screening Your health care provider may recommend screening tests for certain conditions. This may include: Lipid and cholesterol levels. Diabetes screening. This is done by checking your blood sugar (glucose) after you have not eaten for a while (fasting). Pelvic exam and Pap test. Hepatitis B test. Hepatitis C  test. HIV (human immunodeficiency virus) test. STI (sexually transmitted infection) testing, if you are at risk. Lung cancer screening. Colorectal cancer screening. Mammogram. Talk with your health care provider about when you should start having regular mammograms. This may depend on whether you have a family history of breast cancer. BRCA-related cancer screening. This may be done if you have a family history of breast, ovarian, tubal, or peritoneal cancers. Bone density scan. This is done to screen for osteoporosis. Talk with your health care provider about your test results, treatment options, and if necessary, the need for more tests. Follow these instructions at home: Eating and drinking  Eat a diet that includes fresh fruits and vegetables, whole grains, lean protein, and low-fat dairy products. Take vitamin and mineral supplements as recommended by your health care provider. Do not drink alcohol if: Your health care provider tells you not to drink. You are pregnant, may be pregnant, or are planning to become pregnant. If you drink alcohol: Limit how much you have to 0-1 drink a day. Know how much alcohol is in your drink. In the [place] one drink equals one 12 oz bottle of beer (355 mL), one 5 oz glass of wine (148 mL), or one 1 oz glass of hard liquor (44 mL). Lifestyle Brush your teeth every morning and night with fluoride toothpaste. Floss one time each day. Exercise for at least 30 minutes 5 or more days each week. Do not use any products that contain nicotine or tobacco. These products include cigarettes, chewing tobacco, and vaping devices, such as e-cigarettes. If you need help quitting, ask your health care provider. Do not use drugs. If you are sexually active, practice safe sex. Use a condom or other form of protection to   prevent STIs. If you do not wish to become pregnant, use a form of birth control. If you plan to become pregnant, see your health care provider for a  prepregnancy visit. Take aspirin only as told by your health care provider. Make sure that you understand how much to take and what form to take. Work with your health care provider to find out whether it is safe and beneficial for you to take aspirin daily. Find healthy ways to manage stress, such as: Meditation, yoga, or listening to music. Journaling. Talking to a trusted person. Spending time with friends and family. Minimize exposure to UV radiation to reduce your risk of skin cancer. Safety Always wear your seat belt while driving or riding in a vehicle. Do not drive: If you have been drinking alcohol. Do not ride with someone who has been drinking. When you are tired or distracted. While texting. If you have been using any mind-altering substances or drugs. Wear a helmet and other protective equipment during sports activities. If you have firearms in your house, make sure you follow all gun safety procedures. Seek help if you have been physically or sexually abused. What's next? Visit your health care provider once a year for an annual wellness visit. Ask your health care provider how often you should have your eyes and teeth checked. Stay up to date on all vaccines. This information is not intended to replace advice given to you by your health care provider. Make sure you discuss any questions you have with your health care provider. Document Revised: 04/26/2021 Document Reviewed: 04/26/2021 Elsevier Patient Education  2024 Elsevier Inc.  

## 2023-10-21 NOTE — Assessment & Plan Note (Addendum)
Well adult Orders Placed This Encounter  Procedures   DG Cervical Spine Complete    Standing Status:   Future    Number of Occurrences:   1    Standing Expiration Date:   10/20/2024    Order Specific Question:   Reason for Exam (SYMPTOM  OR DIAGNOSIS REQUIRED)    Answer:   neck pain    Order Specific Question:   Preferred imaging location?    Answer:   [facility]   CMP14+EGFR   CBC with Differential/Platelet   Lipid Panel With LDL/HDL Ratio   HgB A1c   TSH   Vitamin D (25 hydroxy)  Screenings: per lab orders Immunizations:  Declines recommended immunizations.  Anticipatory guidance/Risk factor reduction:  Recommendations per AVS.

## 2023-10-21 NOTE — Progress Notes (Signed)
Morgan Maddox - 62 y.o. female MRN [id number]  Date of birth: 26-Mar-1961  Subjective Chief Complaint  Patient presents with   Medical Management of Chronic Issues    HPI Morgan Maddox is a 62 y.o. female here today for annual exam.   She reports that she is doing okj.  Recently had respiratory infection where she had coughed up some blood streaked in her mucus.  Seen at urgent care and had xrays which showed left hemidiaphragm elevation but not other abnormalities.  Today she reports that she is feeling somewhat better.   She did see pulomonary and is getting CT surgery consultation for the elevated hemidiaphragm.    She recent started on zepbound for weight management.  She reports that she is doing well with this.  Her weight is down about 12 lbs since starting this.   Her activity level could be better.  She is working on diet with help from [employer].   She is a former smoker.  Occasional EtOH use.   Review of Systems  Constitutional:  Negative for chills, fever, malaise/fatigue and weight loss.  HENT:  Negative for congestion, ear pain and sore throat.   Eyes:  Negative for blurred vision, double vision and pain.  Respiratory:  Negative for cough and shortness of breath.   Cardiovascular:  Negative for chest pain and palpitations.  Gastrointestinal:  Negative for abdominal pain, blood in stool, constipation, heartburn and nausea.  Genitourinary:  Negative for dysuria and urgency.  Musculoskeletal:  Negative for joint pain and myalgias.  Neurological:  Negative for dizziness and headaches.  Endo/Heme/Allergies:  Does not bruise/bleed easily.  Psychiatric/Behavioral:  Negative for depression. The patient is not nervous/anxious and does not have insomnia.     Allergies  Allergen Reactions   Calamine Rash   Pramoxine-Calamine Rash and Other (See Comments)   Macrobid [Nitrofurantoin Macrocrystal] Nausea Only   Versed [Midazolam]    Diphenhydramine Hcl Rash    Past  Medical History:  Diagnosis Date   Anxiety    Back pain    Bilateral ovarian cysts    Chest pain    Chronic headache    Constipation    Edema, lower extremity    Family history of premature CAD    Brother had an MI, sister has CAD. Mother had an MI at age 17. Still alive at 58.   Fatty liver    Female infertility    History of mononucleosis 04/21/2013   History of sinus surgery 04/21/2013   Hypertriglyceridemia 04/21/2013   Joint pain    Morbid obesity with BMI of 40.0-44.9, adult (HCC)    BMI roughly 43 noted on 02/09/2016 [facility])   Mucocele of nasal sinus 04/21/2013   Obesity    Osteoarthritis    Palpitations    Prediabetes    Pseudotumor cerebri 08/2017   Idiopathic intracerebral hypertension -> s/p right transverse sinus stenting   SOB (shortness of breath)    Vitamin D deficiency     Past Surgical History:  Procedure Laterality Date   MASTOIDECTOMY     NASAL SINUS SURGERY     NM MYOVIEW LTD  02/09/2016   [place]: EF 76%. Periapical defect with partial improvement during rest. No wall motion abnormality noted. Likely related to breast attenuation, cannot exclude small area of periapical ischemia. --> Cardiac catheterization not recommended. 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 Works 10-12 hour shifts. Significant social stress.   Lives alone. Spent most of her time alone. Does not usually interact personally with other people besides her sister   Social Determinants of Health   Financial Resource Strain: Low Risk  (03/03/2023)   Overall Financial Resource Strain (CARDIA)    Difficulty of Paying Living Expenses: Not hard at all  Food Insecurity: Patient Declined (03/03/2023)   Hunger Vital Sign    Worried About Running Out of Food in the Last Year: Patient declined    Ran Out of Food in the Last Year: Patient declined  Transportation Needs: No Transportation Needs (03/03/2023)   PRAPARE - Administrator, Civil Service (Medical): No    Lack of Transportation (Non-Medical): No  Physical Activity: Insufficiently Active (03/03/2023)   Exercise Vital Sign    Days of Exercise per Week: 1 day    Minutes of Exercise per Session: 60 min  Stress: No Stress Concern Present (03/03/2023)   [employer] of Occupational Health - Occupational Stress Questionnaire    Feeling of Stress : Only a little  Social Connections: Unknown (03/03/2023)   Social Connection and Isolation Panel [NHANES]    Frequency of Communication with Friends and Family: More than three times a week    Frequency of Social Gatherings with Friends and Family: Once a week    Attends Religious Services: Patient declined    Database administrator or Organizations: No    Attends Engineer, structural: Not on file    Marital Status: Divorced    Family History  Problem Relation Age of Onset   Heart failure Mother        Long-standing CAD   Heart attack Mother        First MI in 69s   Coronary artery disease Mother    Diabetes Mother    Colon polyps  Mother    High blood pressure Mother    High Cholesterol Mother    Thyroid disease Mother    Depression Mother    Obesity Mother    Coronary artery disease Sister 1   Colon cancer Neg Hx    Esophageal cancer Neg Hx     Health Maintenance  Topic Date Due   DTaP/Tdap/Td (1 - Tdap) Never done   Zoster Vaccines- Shingrix (1 of 2) Never done   COVID-19 Vaccine (3 - 2023-24 season) 07/14/2023   Cervical Cancer Screening (HPV/Pap Cotest)  09/23/2023   INFLUENZA VACCINE  02/10/2024 (Originally 06/13/2023)   MAMMOGRAM  10/03/2024   Colonoscopy  09/10/2027   Hepatitis C Screening  Completed   HIV Screening  Completed   HPV VACCINES  Aged Out     ----------------------------------------------------------------------------------------------------------------------------------------------------------------------------------------------------------------- Physical Exam BP 118/76   Pulse 83   Ht 5\' 6"  (1.676 m)   Wt 267 lb (121.1 kg)   SpO2 97%   BMI 43.09 kg/m   Physical Exam Constitutional:      General: She is not in acute distress. HENT:     Head: Normocephalic and atraumatic.     Right Ear: Tympanic membrane and ear canal normal.     Left Ear: Tympanic membrane and ear canal normal.     Nose: Nose normal.  Eyes:     General: No scleral icterus.    Conjunctiva/sclera: Conjunctivae normal.  Neck:     Thyroid: No thyromegaly.  Cardiovascular:     Rate and Rhythm: Normal rate and regular rhythm.     Heart sounds: Normal heart sounds.  Pulmonary:     Effort: Pulmonary effort is normal.     Breath sounds: Normal breath sounds.  Abdominal:     General: Bowel sounds are normal. There is no distension.     Palpations: Abdomen is soft.     Tenderness: There is no abdominal tenderness. There is no guarding.  Musculoskeletal:        General: Normal range of motion.     Cervical back: Normal range of motion and neck supple.  Lymphadenopathy:     Cervical: No cervical  adenopathy.  Skin:    General: Skin is warm and dry.     Findings: No rash.  Neurological:     General: No focal deficit present.     Mental Status: She is alert and oriented to person, place, and time.     Cranial Nerves: No cranial nerve deficit.     Coordination: Coordination normal.  Psychiatric:        Mood and Affect: Mood normal.        Behavior: Behavior normal.     ------------------------------------------------------------------------------------------------------------------------------------------------------------------------------------------------------------------- Assessment and Plan  Well adult exam Well adult Orders Placed This Encounter  Procedures   DG Cervical Spine Complete    Standing Status:   Future    Number of Occurrences:   1    Standing Expiration Date:   10/20/2024    Order Specific Question:   Reason for Exam (SYMPTOM  OR DIAGNOSIS REQUIRED)    Answer:   neck pain    Order Specific Question:   Preferred imaging location?    Answer:   [facility]   CMP14+EGFR   CBC with Differential/Platelet   Lipid Panel With LDL/HDL Ratio   HgB A1c   TSH   Vitamin D (25 hydroxy)  Screenings: per lab orders Immunizations:  Declines recommended immunizations.  Anticipatory guidance/Risk factor reduction:  Recommendations per AVS.    Chronic neck pain X-ray of cervical spine ordered.  Acquired elevated hemidiaphragm She is scheduled to CT surgery for further evaluation.   Meds ordered this encounter  Medications   valsartan (DIOVAN) 160 MG tablet    Sig: Take 1 tablet (160 mg total) by mouth daily.    Dispense:  90 tablet    Refill:  1   doxycycline (VIBRA-TABS) 100 MG tablet    Sig: Take 1 tablet (100 mg total) by mouth 2 (two) times daily.    Dispense:  8 tablet    Refill:  0    Return in about 4 months (around  02/19/2024) for Hypertension, weight mgmt.    This visit occurred during the SARS-CoV-2 public health emergency.  Safety  protocols were in place, including screening questions prior to the visit, additional usage of staff PPE, and extensive cleaning of exam room while observing appropriate contact time as indicated for disinfecting solutions.

## 2023-10-21 NOTE — Assessment & Plan Note (Signed)
She is scheduled to CT surgery for further evaluation.

## 2023-10-22 ENCOUNTER — Telehealth: Payer: Self-pay | Admitting: Cardiology

## 2023-10-22 LAB — CBC WITH DIFFERENTIAL/PLATELET
Basophils Absolute: 0 10*3/uL (ref 0.0–0.2)
Basos: 0 %
EOS (ABSOLUTE): 0.2 10*3/uL (ref 0.0–0.4)
Eos: 2 %
Hematocrit: 43.1 % (ref 34.0–46.6)
Hemoglobin: 14 g/dL (ref 11.1–15.9)
Immature Grans (Abs): 0 10*3/uL (ref 0.0–0.1)
Immature Granulocytes: 0 %
Lymphocytes Absolute: 2.4 10*3/uL (ref 0.7–3.1)
Lymphs: 27 %
MCH: 28.1 pg (ref 26.6–33.0)
MCHC: 32.5 g/dL (ref 31.5–35.7)
MCV: 86 fL (ref 79–97)
Monocytes Absolute: 0.5 10*3/uL (ref 0.1–0.9)
Monocytes: 5 %
Neutrophils Absolute: 5.8 10*3/uL (ref 1.4–7.0)
Neutrophils: 66 %
Platelets: 407 10*3/uL (ref 150–450)
RBC: 4.99 x10E6/uL (ref 3.77–5.28)
RDW: 12.2 % (ref 11.7–15.4)
WBC: 9 10*3/uL (ref 3.4–10.8)

## 2023-10-22 LAB — LIPID PANEL WITH LDL/HDL RATIO
Cholesterol, Total: 162 mg/dL (ref 100–199)
HDL: 68 mg/dL (ref >39)
LDL Chol Calc (NIH): 78 mg/dL (ref 0–99)
LDL/HDL Ratio: 1.1 {ratio} (ref 0.0–3.2)
Triglycerides: 87 mg/dL (ref 0–149)
VLDL Cholesterol Cal: 16 mg/dL (ref 5–40)

## 2023-10-22 LAB — HEMOGLOBIN A1C
Est. average glucose Bld gHb Est-mCnc: 114 mg/dL
Hgb A1c MFr Bld: 5.6 % (ref 4.8–5.6)

## 2023-10-22 LAB — CMP14+EGFR
ALT: 8 [IU]/L (ref 0–32)
AST: 13 [IU]/L (ref 0–40)
Albumin: 4.2 g/dL (ref 3.9–4.9)
Alkaline Phosphatase: 106 [IU]/L (ref 44–121)
BUN/Creatinine Ratio: 20 (ref 12–28)
BUN: 16 mg/dL (ref 8–27)
Bilirubin Total: 0.6 mg/dL (ref 0.0–1.2)
CO2: 23 mmol/L (ref 20–29)
Calcium: 9.2 mg/dL (ref 8.7–10.3)
Chloride: 100 mmol/L (ref 96–106)
Creatinine, Ser: 0.79 mg/dL (ref 0.57–1.00)
Globulin, Total: 2.2 g/dL (ref 1.5–4.5)
Glucose: 88 mg/dL (ref 70–99)
Potassium: 4.5 mmol/L (ref 3.5–5.2)
Sodium: 139 mmol/L (ref 134–144)
Total Protein: 6.4 g/dL (ref 6.0–8.5)
eGFR: 85 mL/min/{1.73_m2} (ref >59)

## 2023-10-22 LAB — VITAMIN D 25 HYDROXY (VIT D DEFICIENCY, FRACTURES): Vit D, 25-Hydroxy: 25.6 ng/mL — ABNORMAL LOW (ref 30.0–100.0)

## 2023-10-22 LAB — TSH: TSH: 1.51 u[IU]/mL (ref 0.450–4.500)

## 2023-10-22 NOTE — Telephone Encounter (Signed)
I think this is an old finding for her.  Probably the best way to evaluate it would be to do a noncontrasted chest CT.   Bryan Lemma, MD

## 2023-10-22 NOTE — Telephone Encounter (Signed)
Patient identification verified by 2 forms. Marilynn Rail, RN    Called and spoke to patient  Patient states:   -Went to urgent care in [facility] 2 weeks due to coughing up blood (primarily lives in [facility])   -also had pain between shoulder blade, specifically right side, had hard time lifting   -Chest xray should elevated diaphragm would need to see a cardiac thoracic surgeon  -saw PCP yesterday   -was referred to cardiac thoracic surgeon in [facility], but was informed could not be scheduled without additional imaging, can't be scheduled only based on a chest xray  -feels the same (lots of fatigue)   -has no new concerns at this time  Informed patient:   -would be best to have urgent care send visit note to Dr. Herbie Baltimore   -message sent to Dr. Herbie Baltimore to review Xray result in epic  Patient states she will contact [place] urgent care have notes faxed  Patient has no further questions at this time

## 2023-10-22 NOTE — Telephone Encounter (Signed)
Pt is requesting a callback from nurse regarding her most recent visit to urgent care in [facility], the results and them being sent to her pcp. She'd like to make office aware of what happened and to be advised on what to do next. Please advise

## 2023-10-23 NOTE — Telephone Encounter (Signed)
Elevated hemidiaphragm

## 2023-10-24 NOTE — Telephone Encounter (Signed)
Patient identification verified by 2 forms. Morgan Rail, RN    Called and spoke to patient  Patient states:   -she has chest CT scheduled for 12/18  -Urgent care provider ordered Chest CT to be completed   -CT will be completed at [facility] in [place]   -she will have results faxed to office one complete  Patient has no further questions at this time

## 2023-10-30 ENCOUNTER — Encounter: Payer: Self-pay | Admitting: Family Medicine

## 2023-11-01 MED ORDER — ZEPBOUND 12.5 MG/0.5ML ~~LOC~~ SOAJ: 12.5000 mg | SUBCUTANEOUS | 0 refills | Status: DC

## 2023-11-01 MED ORDER — ZEPBOUND 10 MG/0.5ML ~~LOC~~ SOAJ: 10.0000 mg | SUBCUTANEOUS | 0 refills | Status: DC

## 2023-12-20 ENCOUNTER — Other Ambulatory Visit: Payer: Self-pay | Admitting: Family Medicine

## 2023-12-20 ENCOUNTER — Telehealth: Payer: Self-pay

## 2023-12-20 MED ORDER — ZEPBOUND 15 MG/0.5ML ~~LOC~~ SOAJ: 15.0000 mg | SUBCUTANEOUS | 1 refills | Status: DC

## 2023-12-20 NOTE — Telephone Encounter (Signed)
 Copied from CRM [id number]. Topic: Clinical - Prescription Issue >> Dec 20, 2023  1:50 PM Morgan Maddox wrote: Reason for CRM: PT called in because prescription was ready at [facility] and was told it would be $162 due at pickup, pt calling in stating she used to pay $0.   tirzepatide  (ZEPBOUND ) 15 MG/0.5ML Pen  Pt would like call back [phone]

## 2023-12-20 NOTE — Telephone Encounter (Signed)
 Called pharmacist -  was told that PA could not be done was being ran through insurance already - she states was related to patient needing to satisfy a deductible as this is the first of the year.  Patient informed as above and states she will contact pharmacy to get more clarification regarding this.

## 2023-12-20 NOTE — Telephone Encounter (Signed)
 Last Fill: 11/01/23  Last OV: 10/21/23 Next OV: None Scheduled  Routing to provider for review/authorization.

## 2023-12-20 NOTE — Telephone Encounter (Signed)
 Copied from CRM [id number]. Topic: Clinical - Medication Refill >> Dec 20, 2023 11:54 AM Joesph PARAS wrote: Most Recent Primary Care Visit:  Provider: ALVIA BRING  Department: [facility]  Visit Type: PHYSICAL  Date: 10/21/2023  Medication: ZEPBOUND  15mg   Has the patient contacted their pharmacy? Yes - Moved up to 15 dose so pharmacy said to call provider.  Is this the correct pharmacy for this prescription? Yes  This is the patient's preferred pharmacy:  [facility] [phone] - [address], [place] - [address] [place] [address] Phone: [phone] Fax: [phone]   Has the prescription been filled recently? No  Is the patient out of the medication? Yes  Has the patient been seen for an appointment in the last year OR does the patient have an upcoming appointment? Yes  Can we respond through MyChart? Yes  Agent: Please be advised that Rx refills may take up to 3 business days. We ask that you follow-up with your pharmacy.  Patient requesting multiple refills.

## 2024-01-09 DIAGNOSIS — H90A11 Conductive hearing loss, unilateral, right ear with restricted hearing on the contralateral side: Secondary | ICD-10-CM | POA: Insufficient documentation

## 2024-01-09 DIAGNOSIS — H938X1 Other specified disorders of right ear: Secondary | ICD-10-CM | POA: Insufficient documentation

## 2024-01-09 DIAGNOSIS — R2689 Other abnormalities of gait and mobility: Secondary | ICD-10-CM | POA: Insufficient documentation

## 2024-01-13 DIAGNOSIS — J4550 Severe persistent asthma, uncomplicated: Secondary | ICD-10-CM | POA: Insufficient documentation

## 2024-01-19 ENCOUNTER — Encounter: Payer: Self-pay | Admitting: Family Medicine

## 2024-02-03 ENCOUNTER — Encounter: Payer: Self-pay | Admitting: Family Medicine

## 2024-02-03 ENCOUNTER — Ambulatory Visit: Admitting: Family Medicine

## 2024-02-03 VITALS — BP 124/77 | HR 69 | Ht 66.0 in | Wt 265.0 lb

## 2024-02-03 DIAGNOSIS — J4550 Severe persistent asthma, uncomplicated: Secondary | ICD-10-CM | POA: Diagnosis not present

## 2024-02-03 DIAGNOSIS — I10 Essential (primary) hypertension: Secondary | ICD-10-CM | POA: Diagnosis not present

## 2024-02-03 DIAGNOSIS — E66813 Obesity, class 3: Secondary | ICD-10-CM

## 2024-02-03 DIAGNOSIS — Z6841 Body Mass Index (BMI) 40.0 and over, adult: Secondary | ICD-10-CM

## 2024-02-03 MED ORDER — DIETHYLPROPION HCL 25 MG PO TABS: ORAL_TABLET | ORAL | 0 refills | Status: AC

## 2024-02-03 NOTE — Assessment & Plan Note (Signed)
 She does not want to continue on GLP-1's due to possibly having side effects from these.  She did well with diethylpropion in the past and we discussed trying this again for short period we discussed that use of this is limited.  Her blood pressure is well-controlled at this time.

## 2024-02-03 NOTE — Progress Notes (Signed)
 Patient states she was experiencing labored breathing and SOB with any exertion. Pt was seen by Pulmonology. Given ABX and steroids. Breathing has recovered well. Still feels some sinus pressure. States feels better since stopping Zepbound. Feels Zepbound may be the root cause of her myalgia and SOB.

## 2024-02-03 NOTE — Progress Notes (Signed)
 Morgan Maddox - 63 y.o. female MRN [id number]  Date of birth: 1961/09/17  Subjective Chief Complaint  Patient presents with   Shortness of Breath    HPI Morgan Maddox is a 63 year old female here today for follow-up visit.  She has discontinued tirzepatide.  She was experiencing significant dyspnea that she thought may have been caused by the sutures appetite.  She has seen pulmonology and they are managing his reactive airway.  She was treated with course of antibiotics as well as steroids this along with discontinuation of tirzepatide seem to improve her symptoms.  She has not had any further symptoms.  She has been prescribed Singulair and Symbicort.  She does not think she has noticed a whole lot of change with Symbicort.  She has not had chest pain or tightness.  Her blood pressure mains well-controlled with valsartan.  No side effects to current strength.  She does want to continue on medication to help with weight management.  She does not want to use any additional GLP-1 medications.  She was on diethylpropion in the past and had good response to this.  She did not have side effects with this.  ROS:  A comprehensive ROS was completed and negative except as noted per HPI  Allergies  Allergen Reactions   Calamine Rash   Pramoxine-Calamine Rash and Other (See Comments)   Macrobid [Nitrofurantoin Macrocrystal] Nausea Only   Versed [Midazolam]    Diphenhydramine Hcl Rash    Past Medical History:  Diagnosis Date   Anxiety    Back pain    Bilateral ovarian cysts    Chest pain    Chronic headache    Constipation    Edema, lower extremity    Family history of premature CAD    Brother had an MI, sister has CAD. Mother had an MI at age 21. Still alive at 20.   Fatty liver    Female infertility    History of mononucleosis 04/21/2013   History of sinus surgery 04/21/2013   Hypertriglyceridemia 04/21/2013   Joint pain    Morbid obesity with BMI of 40.0-44.9, adult (HCC)     BMI roughly 43 noted on 02/09/2016 admission [facility])   Mucocele of nasal sinus 04/21/2013   Obesity    Osteoarthritis    Palpitations    Prediabetes    Pseudotumor cerebri 08/2017   Idiopathic intracerebral hypertension -> s/p right transverse sinus stenting   SOB (shortness of breath)    Vitamin D deficiency     Past Surgical History:  Procedure Laterality Date   MASTOIDECTOMY     NASAL SINUS SURGERY     NM MYOVIEW LTD  02/09/2016   [place]: EF 76%. Periapical defect with partial improvement during rest. No wall motion abnormality noted. Likely related to breast attenuation, cannot exclude small area of periapical ischemia. --> Cardiac catheterization not recommended. 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 Works 10-12 hour shifts. Significant social stress.   Lives alone. Spent most of her time alone. Does not usually interact personally with other people besides her sister   Social Drivers of Corporate investment banker Strain: Low Risk  (01/31/2024)   Overall Financial Resource Strain (CARDIA)     Difficulty of Paying Living Expenses: Not hard at all  Food Insecurity: No Food Insecurity (01/31/2024)   Hunger Vital Sign    Worried About Running Out of Food in the Last Year: Never true    Ran Out of Food in the Last Year: Never true  Transportation Needs: No Transportation Needs (01/31/2024)   PRAPARE - Administrator, Civil Service (Medical): No    Lack of Transportation (Non-Medical): No  Physical Activity: Insufficiently Active (01/31/2024)   Exercise Vital Sign    Days of Exercise per Week: 1 day    Minutes of Exercise per Session: 30 min  Stress: No Stress Concern Present (01/31/2024)   [employer] of Occupational Health - Occupational Stress Questionnaire    Feeling of Stress : Not at all  Social Connections: Moderately Isolated (01/31/2024)   Social Connection and Isolation Panel [NHANES]    Frequency of Communication with Friends and Family: More than three times a week    Frequency of Social Gatherings with Friends and Family: Once a week    Attends Religious Services: 1 to 4 times per year    Active Member of Clubs or Organizations: No    Attends Engineer, structural: Not on file    Marital Status: Divorced    Family History  Problem Relation Age of Onset   Heart failure Mother        Long-standing CAD   Heart attack Mother        First MI in 74s   Coronary artery disease Mother    Diabetes Mother    Colon polyps Mother    High blood pressure Mother    High Cholesterol Mother    Thyroid disease Mother    Depression Mother    Obesity Mother    Coronary artery disease Sister 42   Colon cancer Neg Hx    Esophageal cancer Neg Hx     Health Maintenance  Topic Date Due   Pneumococcal Vaccine 76-33 Years old (1 of 2 - PCV) Never done   DTaP/Tdap/Td (1 - Tdap) Never done   Zoster Vaccines- Shingrix (1 of 2) Never done   Cervical Cancer Screening (HPV/Pap Cotest)  09/23/2023   INFLUENZA VACCINE  02/10/2024 (Originally 06/13/2023)   COVID-19  Vaccine (3 - 2024-25 season) 07/21/2024 (Originally 07/14/2023)   MAMMOGRAM  10/20/2025   Colonoscopy  09/10/2027   Hepatitis C Screening  Completed   HIV Screening  Completed   HPV VACCINES  Aged Out     ----------------------------------------------------------------------------------------------------------------------------------------------------------------------------------------------------------------- Physical Exam BP 124/77 (BP Location: Left Arm, Patient Position: Sitting, Cuff Size: Large)   Pulse 69   Ht 5\' 6"  (1.676 m)   Wt 265 lb (120.2 kg)  SpO2 98%   BMI 42.77 kg/m   Physical Exam Constitutional:      Appearance: She is well-developed.  HENT:     Head: Normocephalic and atraumatic.  Eyes:     General: No scleral icterus. Cardiovascular:     Rate and Rhythm: Normal rate and regular rhythm.  Pulmonary:     Effort: Pulmonary effort is normal.     Breath sounds: Normal breath sounds.  Musculoskeletal:     Cervical back: Neck supple.  Neurological:     Mental Status: She is alert.  Psychiatric:        Mood and Affect: Mood normal.        Behavior: Behavior normal.     ------------------------------------------------------------------------------------------------------------------------------------------------------------------------------------------------------------------- Assessment and Plan  Reactive airway disease, severe persistent, uncomplicated She is seeing pulmonology.  Encouraged to continue Singulair as well as Symbicort.  Essential hypertension Blood pressure is well-controlled with valsartan and current strength.  Will plan to continue this.  Class 3 severe obesity with serious comorbidity and body mass index (BMI) of 40.0 to 44.9 in adult [facility]) She does not want to continue on GLP-1's due to possibly having side effects from these.  She did well with diethylpropion in the past and we discussed trying this again for short period we  discussed that use of this is limited.  Her blood pressure is well-controlled at this time.   Meds ordered this encounter  Medications   Diethylpropion HCl 25 MG TABS    Sig: Take 1/2-1 tab 1 hour prior to meal TID    Dispense:  90 tablet    Refill:  0    Return in about 3 months (around 05/05/2024) for weight management, Hypertension.    This visit occurred during the SARS-CoV-2 public health emergency.  Safety protocols were in place, including screening questions prior to the visit, additional usage of staff PPE, and extensive cleaning of exam room while observing appropriate contact time as indicated for disinfecting solutions.

## 2024-02-03 NOTE — Assessment & Plan Note (Signed)
 Blood pressure is well-controlled with valsartan and current strength.  Will plan to continue this.

## 2024-02-03 NOTE — Assessment & Plan Note (Signed)
 She is seeing pulmonology.  Encouraged to continue Singulair as well as Symbicort.

## 2024-02-04 ENCOUNTER — Telehealth: Payer: Self-pay

## 2024-02-04 NOTE — Telephone Encounter (Signed)
 Task completed. Contacted the pharmacy directly. Clarified the directions for diethylpropion hci 25 mg.

## 2024-02-04 NOTE — Telephone Encounter (Signed)
 Copied from CRM [id number]. Topic: Clinical - Prescription Issue >> Feb 03, 2024  2:41 PM Nila Nephew wrote: Reason for CRM: Wynona Canes with [facility] in [facility] calling to request a Rx verification for Diethylpropion HCl 25 MG TABS. Please call back at [phone].

## 2024-04-17 ENCOUNTER — Other Ambulatory Visit: Payer: Self-pay | Admitting: Family Medicine

## 2024-04-27 ENCOUNTER — Ambulatory Visit: Admitting: Family Medicine

## 2024-05-07 ENCOUNTER — Other Ambulatory Visit: Payer: Self-pay | Admitting: Family Medicine

## 2024-09-25 ENCOUNTER — Telehealth: Payer: Self-pay

## 2024-09-25 NOTE — Telephone Encounter (Signed)
 Pharmacy Patient Advocate Encounter   Received notification from [employer] that prior authorization for ZEPBOUND  is due for renewal.   Insurance verification completed.   The patient is insured through [employer] .  Action: Medication has been discontinued. Archived Key: [id number]
